# Patient Record
Sex: Female | Born: 1990 | Race: Asian | Hispanic: No | Marital: Married | Smoking: Never smoker
Health system: Southern US, Community
[De-identification: ages and names within clinical notes are randomized; demographics above are authoritative.]

## PROBLEM LIST (undated history)

## (undated) DIAGNOSIS — E039 Hypothyroidism, unspecified: Secondary | ICD-10-CM

## (undated) DIAGNOSIS — J45909 Unspecified asthma, uncomplicated: Secondary | ICD-10-CM

## (undated) DIAGNOSIS — J343 Hypertrophy of nasal turbinates: Secondary | ICD-10-CM

## (undated) DIAGNOSIS — J342 Deviated nasal septum: Secondary | ICD-10-CM

---

## 2012-07-03 ENCOUNTER — Ambulatory Visit (INDEPENDENT_AMBULATORY_CARE_PROVIDER_SITE_OTHER): Payer: No Typology Code available for payment source | Admitting: Family Medicine

## 2012-07-03 VITALS — BP 111/68 | HR 77 | Temp 98.0°F | Resp 16 | Ht 61.0 in | Wt 106.6 lb

## 2012-07-03 DIAGNOSIS — J45909 Unspecified asthma, uncomplicated: Secondary | ICD-10-CM | POA: Insufficient documentation

## 2012-07-03 DIAGNOSIS — R599 Enlarged lymph nodes, unspecified: Secondary | ICD-10-CM | POA: Insufficient documentation

## 2012-07-03 LAB — POCT CBC
Granulocyte percent: 54.3 %G (ref 37–80)
Hemoglobin: 14.9 g/dL (ref 12.2–16.2)
MCH, POC: 28.5 pg (ref 27–31.2)
MPV: 8.1 fL (ref 0–99.8)
POC MID %: 7.9 %M (ref 0–12)
Platelet Count, POC: 322 10*3/uL (ref 142–424)
RBC: 5.22 M/uL (ref 4.04–5.48)
WBC: 6.9 10*3/uL (ref 4.6–10.2)

## 2012-07-03 MED ORDER — ALBUTEROL SULFATE HFA 108 (90 BASE) MCG/ACT IN AERS: 2.0000 | INHALATION_SPRAY | Freq: Four times a day (QID) | RESPIRATORY_TRACT | Status: DC | PRN

## 2012-07-03 NOTE — Patient Instructions (Signed)
Thank you for coming in today. 1) asthma: Avoid chicken and shrimp for one month. Use the albuterol inhaler as needed for wheezing. Return in one month for evaluation. Call or go to the emergency room if you get worse, have trouble breathing, have chest pains, or palpitations.  2) lymph node: I think this lymph node needs to be removed and evaluated. We will send you to the Gen. surgery office to have this done.  If you have not heard from [place] by Friday please call the office and ask about your referral to general surgery.

## 2012-07-03 NOTE — Progress Notes (Addendum)
Anne Newton is a 22 y.o. female who presents to [facility] today for  1) left anterior cervical lymph node present for the last 2 months. The lymph node is large and nontender. It waxes and wanes. When asked she does have an approximately 15 pound unintentional weight loss. This is associated with starting a new job and maybe related to increased activity and decreased food intake. She denies any night sweats fevers or chills and feels well otherwise. She denies any other lumps or bumps across her body her history personal or family of malignancy.  2) asthma: Patient notes a childhood history of asthma. This is doing well until recently. Over the last several months she notes wheezing. This mostly occurs after eating chicken and shrimp. However she does note occasional wheezing with seasonal allergy symptoms. She denies any chest pain shortness of breath fevers or chills currently.   PMH: Reviewed asthma History  Substance Use Topics  . Smoking status: Never Smoker   . Smokeless tobacco: Not on file  . Alcohol Use: Not on file   ROS as above  Medications reviewed. No current outpatient prescriptions on file.   No current facility-administered medications for this visit.    Exam:  BP 111/68  Pulse 77  Temp(Src) 98 F (36.7 C) (Oral)  Resp 16  Ht 5\' 1"  (1.549 m)  Wt 106 lb 9.6 oz (48.353 kg)  BMI 20.15 kg/m2  SpO2 98%  PF 350 L/min  LMP 07/02/2012 Gen: Well NAD HEENT: EOMI,  MMM. Approximately 1.5 cm solid nontender anterior cervical lymph node on the left side. This is mobile Lungs: CTABL Nl WOB Heart: RRR no MRG Abd: NABS, NT, ND Exts: Non edematous BL  LE, warm and well perfused.  Peak flow around 300-350 with multiple attempts with coaching. Goal for height is 430  Results for orders placed in visit on 07/03/12 (from the past 72 hour(s))  POCT CBC     Status: None   Collection Time    07/03/12  7:45 PM      Result Value Range   WBC 6.9  4.6 - 10.2 K/uL   Lymph, poc 2.6  0.6 -  3.4   POC LYMPH PERCENT 37.8  10 - 50 %L   MID (cbc) 0.5  0 - 0.9   POC MID % 7.9  0 - 12 %M   POC Granulocyte 3.7  2 - 6.9   Granulocyte percent 54.3  37 - 80 %G   RBC 5.22  4.04 - 5.48 M/uL   Hemoglobin 14.9  12.2 - 16.2 g/dL   HCT, POC 21.3  08.6 - 47.9 %   MCV 89.9  80 - 97 fL   MCH, POC 28.5  27 - 31.2 pg   MCHC 31.8  31.8 - 35.4 g/dL   RDW, POC 57.8     Platelet Count, POC 322  142 - 424 K/uL   MPV 8.1  0 - 99.8 fL    Assessment and Plan: 22 y.o. female with   1) single anterior cervical enlarged lymph node. This is associated with an unintentional weight loss. I am concerned. Refer patient to general surgery for excisional biopsy.  2) asthma: Patient has some asthma symptoms and has a decreased peak flow in the office today.  Her history of wheezing with chicken and shrimp is somewhat confusing. She may have a food allergy and not asthma. Advised patient against consuming chicken and shrimp for one month. Additionally use albuterol as needed. We'll  followup in one month for further evaluation and management for this problem.  Will call referral to boy friend who speaks better english. [phone] Anne Newton)

## 2012-07-04 LAB — SEDIMENTATION RATE: Sed Rate: 6 mm/hr (ref 0–22)

## 2012-07-16 ENCOUNTER — Ambulatory Visit (INDEPENDENT_AMBULATORY_CARE_PROVIDER_SITE_OTHER): Payer: No Typology Code available for payment source | Admitting: Surgery

## 2012-07-19 ENCOUNTER — Other Ambulatory Visit: Payer: Self-pay | Admitting: Otolaryngology

## 2012-07-19 DIAGNOSIS — E041 Nontoxic single thyroid nodule: Secondary | ICD-10-CM

## 2012-07-31 ENCOUNTER — Ambulatory Visit
Admission: RE | Admit: 2012-07-31 | Discharge: 2012-07-31 | Disposition: A | Discharge status: Home / Self Care | Payer: No Typology Code available for payment source | Source: Ambulatory Visit | Attending: Otolaryngology | Admitting: Otolaryngology

## 2012-07-31 DIAGNOSIS — E041 Nontoxic single thyroid nodule: Secondary | ICD-10-CM

## 2012-07-31 MED ORDER — IOHEXOL 300 MG/ML  SOLN
75.0000 mL | Freq: Once | INTRAMUSCULAR | Status: AC | PRN
  Administered 2012-07-31: 75 mL via INTRAVENOUS

## 2012-08-05 ENCOUNTER — Other Ambulatory Visit: Payer: Self-pay | Admitting: Otolaryngology

## 2012-08-05 DIAGNOSIS — E041 Nontoxic single thyroid nodule: Secondary | ICD-10-CM

## 2012-08-06 ENCOUNTER — Other Ambulatory Visit: Payer: Self-pay | Admitting: Otolaryngology

## 2012-08-06 DIAGNOSIS — E041 Nontoxic single thyroid nodule: Secondary | ICD-10-CM

## 2012-08-07 ENCOUNTER — Ambulatory Visit
Admission: RE | Admit: 2012-08-07 | Discharge: 2012-08-07 | Disposition: A | Discharge status: Home / Self Care | Payer: No Typology Code available for payment source | Source: Ambulatory Visit | Attending: Otolaryngology | Admitting: Otolaryngology

## 2012-08-07 ENCOUNTER — Other Ambulatory Visit (HOSPITAL_COMMUNITY)
Admission: RE | Admit: 2012-08-07 | Discharge: 2012-08-07 | Disposition: A | Discharge status: Home / Self Care | Payer: No Typology Code available for payment source | Source: Ambulatory Visit | Attending: Interventional Radiology | Admitting: Interventional Radiology

## 2012-08-07 DIAGNOSIS — D34 Benign neoplasm of thyroid gland: Secondary | ICD-10-CM | POA: Insufficient documentation

## 2012-08-07 DIAGNOSIS — E041 Nontoxic single thyroid nodule: Secondary | ICD-10-CM

## 2012-08-21 ENCOUNTER — Encounter (HOSPITAL_COMMUNITY): Payer: Self-pay | Admitting: Pharmacy Technician

## 2012-08-26 ENCOUNTER — Encounter (HOSPITAL_COMMUNITY): Payer: Self-pay

## 2012-08-26 ENCOUNTER — Other Ambulatory Visit: Payer: Self-pay | Admitting: Otolaryngology

## 2012-08-26 ENCOUNTER — Encounter (HOSPITAL_COMMUNITY)
Admission: RE | Admit: 2012-08-26 | Discharge: 2012-08-26 | Disposition: A | Discharge status: Home / Self Care | Payer: No Typology Code available for payment source | Source: Ambulatory Visit | Attending: Otolaryngology | Admitting: Otolaryngology

## 2012-08-26 HISTORY — DX: Unspecified asthma, uncomplicated: J45.909

## 2012-08-26 LAB — CBC
HCT: 41.4 % (ref 36.0–46.0)
MCV: 84.7 fL (ref 78.0–100.0)
Platelets: 220 10*3/uL (ref 150–400)
RBC: 4.89 MIL/uL (ref 3.87–5.11)
WBC: 6.3 10*3/uL (ref 4.0–10.5)

## 2012-08-26 NOTE — H&P (Signed)
PREOPERATIVE H&P  Chief Complaint: left thyroid mass  HPI: Anne Newton is a 22 y.o. female who presents for evaluation of a left thyroid mass for 3 months. 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 She's taken to the OR for left thyroid lobectomy.  Past Medical History  Diagnosis Date  . Thyroid nodule   . Asthma   . Dysrhythmia     occ palpitations   No past surgical history on file. History   Social History  . Marital Status: Single    Spouse Name: N/A    Number of Children: N/A  . Years of Education: N/A   Social History Main Topics  . Smoking status: Never Smoker   . Smokeless tobacco: Not on file  . Alcohol Use: No  . Drug Use: No  . Sexually Active: Not on file   Other Topics Concern  . Not on file   Social History Narrative  . No narrative on file   No family history on file. Allergies  Allergen Reactions  . Chicken Allergy Shortness Of Breath    Causes  Asthma to flare up also shrimp   Prior to Admission medications   Medication Sig Start Date End Date Taking? Authorizing Provider  albuterol (PROVENTIL HFA;VENTOLIN HFA) 108 (90 BASE) MCG/ACT inhaler Inhale 2 puffs into the lungs every 6 (six) hours as needed for wheezing.   Yes Historical Provider, MD     Positive ROS: negative  All other systems have been reviewed and were otherwise negative with the exception of those mentioned in the HPI and as above.  Physical Exam: There were no vitals filed for this visit.  General: Alert, no acute distress Oral: Normal oral mucosa and tonsils Nasal: Clear nasal passages Neck: No palpable adenopathy. 2 cm left thyroid nodule Ear: Ear canal is clear with normal appearing TMs Cardiovascular: Regular rate and rhythm, no murmur.  Respiratory: Clear to auscultation Neurologic: Alert and oriented x 3   Assessment/Plan: THYROID NODULE  Plan for Procedure(s): LEFT THYROID LOBECTOMY   Dillard Cannon, MD 08/26/2012 3:29 PM

## 2012-08-26 NOTE — Progress Notes (Addendum)
No orders at preadmit  Office called No cxr needed unless other criteria, i.e. Problems with asthma or htn, cough ,etc  Per allison zelenak pa/dr hodierne

## 2012-08-26 NOTE — Pre-Procedure Instructions (Addendum)
Anne Newton  08/26/2012   Your procedure is scheduled on:  August 28, 2012 at 1:30 PM  Report to [facility] at 11:30 AM.  Call this number if you have problems the morning of surgery: [phone]   Remember:   Do not eat food or drink liquids after midnight.   Take these medicines the morning of surgery with A SIP OF WATER: Albuterol (Proventil/Ventolin) Inhaler if needed   Do not wear jewelry, make-up or nail polish.  Do not wear lotions, powders, or perfumes. You may wear deodorant.  Do not shave 48 hours prior to surgery. Men may shave face and neck.  Do not bring valuables to the hospital.  [facility] is not responsible                   for any belongings or valuables.  Contacts, dentures or bridgework may not be worn into surgery.  Leave suitcase in the car. After surgery it may be brought to your room.  For patients admitted to the hospital, checkout time is 11:00 AM the day of  discharge.     Special Instructions: Shower using CHG 2 nights before surgery and the night before surgery.  If you shower the day of surgery use CHG.  Use special wash - you have one bottle of CHG for all showers.  You should use approximately 1/3 of the bottle for each shower.   Please read over the following fact sheets that you were given: Pain Booklet, Coughing and Deep Breathing and Surgical Site Infection Prevention

## 2012-08-27 MED ORDER — CEFAZOLIN SODIUM-DEXTROSE 2-3 GM-% IV SOLR
2.0000 g | INTRAVENOUS | Status: AC
  Administered 2012-08-28: 2 g via INTRAVENOUS
  Filled 2012-08-27: qty 50

## 2012-08-28 ENCOUNTER — Observation Stay (HOSPITAL_COMMUNITY)
Admission: RE | Admit: 2012-08-28 | Discharge: 2012-08-29 | Disposition: A | Discharge status: Home / Self Care | Payer: No Typology Code available for payment source | Source: Ambulatory Visit | Attending: Otolaryngology | Admitting: Otolaryngology

## 2012-08-28 ENCOUNTER — Encounter (HOSPITAL_COMMUNITY): Payer: Self-pay | Admitting: *Deleted

## 2012-08-28 ENCOUNTER — Encounter (HOSPITAL_COMMUNITY)
Admission: RE | Disposition: A | Discharge status: Home / Self Care | Payer: Self-pay | Source: Ambulatory Visit | Attending: Otolaryngology

## 2012-08-28 ENCOUNTER — Ambulatory Visit (HOSPITAL_COMMUNITY): Payer: No Typology Code available for payment source | Admitting: Certified Registered"

## 2012-08-28 ENCOUNTER — Encounter (HOSPITAL_COMMUNITY): Payer: Self-pay | Admitting: Vascular Surgery

## 2012-08-28 DIAGNOSIS — C770 Secondary and unspecified malignant neoplasm of lymph nodes of head, face and neck: Secondary | ICD-10-CM | POA: Insufficient documentation

## 2012-08-28 DIAGNOSIS — C73 Malignant neoplasm of thyroid gland: Principal | ICD-10-CM | POA: Insufficient documentation

## 2012-08-28 DIAGNOSIS — Z01812 Encounter for preprocedural laboratory examination: Secondary | ICD-10-CM | POA: Insufficient documentation

## 2012-08-28 HISTORY — PX: THYROIDECTOMY: SHX17

## 2012-08-28 SURGERY — THYROIDECTOMY
Anesthesia: General | Site: Neck | Laterality: Left | Wound class: Clean

## 2012-08-28 MED ORDER — LIDOCAINE-EPINEPHRINE 1 %-1:100000 IJ SOLN
INTRAMUSCULAR | Status: DC | PRN
  Administered 2012-08-28: 20 mL

## 2012-08-28 MED ORDER — MEPERIDINE HCL 25 MG/ML IJ SOLN: 6.2500 mg | INTRAMUSCULAR | Status: DC | PRN

## 2012-08-28 MED ORDER — BACITRACIN ZINC 500 UNIT/GM EX OINT
TOPICAL_OINTMENT | CUTANEOUS | Status: AC
  Filled 2012-08-28: qty 15

## 2012-08-28 MED ORDER — ONDANSETRON HCL 4 MG/2ML IJ SOLN
INTRAMUSCULAR | Status: DC | PRN
  Administered 2012-08-28: 4 mg via INTRAVENOUS

## 2012-08-28 MED ORDER — ALBUTEROL SULFATE HFA 108 (90 BASE) MCG/ACT IN AERS: 2.0000 | INHALATION_SPRAY | Freq: Four times a day (QID) | RESPIRATORY_TRACT | Status: DC | PRN

## 2012-08-28 MED ORDER — MORPHINE SULFATE 2 MG/ML IJ SOLN
2.0000 mg | INTRAMUSCULAR | Status: DC | PRN
  Administered 2012-08-29: 2 mg via INTRAVENOUS
  Filled 2012-08-28: qty 1

## 2012-08-28 MED ORDER — ONDANSETRON HCL 4 MG/2ML IJ SOLN
4.0000 mg | INTRAMUSCULAR | Status: DC | PRN
  Administered 2012-08-28 – 2012-08-29 (×2): 4 mg via INTRAVENOUS
  Filled 2012-08-28 (×2): qty 2

## 2012-08-28 MED ORDER — CEFAZOLIN SODIUM 1-5 GM-% IV SOLN
1.0000 g | Freq: Three times a day (TID) | INTRAVENOUS | Status: DC
  Administered 2012-08-28 – 2012-08-29 (×3): 1 g via INTRAVENOUS
  Filled 2012-08-28 (×5): qty 50

## 2012-08-28 MED ORDER — KCL IN DEXTROSE-NACL 20-5-0.45 MEQ/L-%-% IV SOLN
INTRAVENOUS | Status: DC
  Administered 2012-08-29: 03:00:00 via INTRAVENOUS
  Filled 2012-08-28 (×4): qty 1000

## 2012-08-28 MED ORDER — ACETAMINOPHEN 160 MG/5ML PO SOLN: 650.0000 mg | ORAL | Status: DC | PRN

## 2012-08-28 MED ORDER — 0.9 % SODIUM CHLORIDE (POUR BTL) OPTIME
TOPICAL | Status: DC | PRN
  Administered 2012-08-28: 1000 mL

## 2012-08-28 MED ORDER — ARTIFICIAL TEARS OP OINT
TOPICAL_OINTMENT | OPHTHALMIC | Status: DC | PRN
  Administered 2012-08-28: 1 via OPHTHALMIC

## 2012-08-28 MED ORDER — ACETAMINOPHEN 650 MG RE SUPP: 650.0000 mg | RECTAL | Status: DC | PRN

## 2012-08-28 MED ORDER — OXYCODONE HCL 5 MG/5ML PO SOLN: 5.0000 mg | Freq: Once | ORAL | Status: DC | PRN

## 2012-08-28 MED ORDER — PHENYLEPHRINE HCL 10 MG/ML IJ SOLN
INTRAMUSCULAR | Status: DC | PRN
  Administered 2012-08-28 (×3): 80 ug via INTRAVENOUS
  Administered 2012-08-28 (×3): 40 ug via INTRAVENOUS
  Administered 2012-08-28: 120 ug via INTRAVENOUS
  Administered 2012-08-28: 80 ug via INTRAVENOUS

## 2012-08-28 MED ORDER — ROCURONIUM BROMIDE 100 MG/10ML IV SOLN
INTRAVENOUS | Status: DC | PRN
  Administered 2012-08-28: 5 mg via INTRAVENOUS
  Administered 2012-08-28: 30 mg via INTRAVENOUS

## 2012-08-28 MED ORDER — OXYCODONE HCL 5 MG PO TABS: 5.0000 mg | ORAL_TABLET | Freq: Once | ORAL | Status: DC | PRN

## 2012-08-28 MED ORDER — MIDAZOLAM HCL 2 MG/2ML IJ SOLN: 0.5000 mg | Freq: Once | INTRAMUSCULAR | Status: DC | PRN

## 2012-08-28 MED ORDER — LACTATED RINGERS IV SOLN
INTRAVENOUS | Status: DC
  Administered 2012-08-28 (×2): via INTRAVENOUS

## 2012-08-28 MED ORDER — PROMETHAZINE HCL 25 MG/ML IJ SOLN: 6.2500 mg | INTRAMUSCULAR | Status: DC | PRN

## 2012-08-28 MED ORDER — PROPOFOL 10 MG/ML IV BOLUS
INTRAVENOUS | Status: DC | PRN
  Administered 2012-08-28: 130 mg via INTRAVENOUS

## 2012-08-28 MED ORDER — HYDROMORPHONE HCL PF 1 MG/ML IJ SOLN
0.2500 mg | INTRAMUSCULAR | Status: DC | PRN
  Administered 2012-08-28: 0.5 mg via INTRAVENOUS

## 2012-08-28 MED ORDER — FENTANYL CITRATE 0.05 MG/ML IJ SOLN
INTRAMUSCULAR | Status: DC | PRN
  Administered 2012-08-28: 25 ug via INTRAVENOUS
  Administered 2012-08-28: 100 ug via INTRAVENOUS

## 2012-08-28 MED ORDER — ONDANSETRON HCL 4 MG PO TABS: 4.0000 mg | ORAL_TABLET | ORAL | Status: DC | PRN

## 2012-08-28 MED ORDER — PROMETHAZINE HCL 25 MG/ML IJ SOLN
INTRAMUSCULAR | Status: AC
  Administered 2012-08-28: 6.25 mg via INTRAVENOUS
  Filled 2012-08-28: qty 1

## 2012-08-28 MED ORDER — MIDAZOLAM HCL 5 MG/5ML IJ SOLN
INTRAMUSCULAR | Status: DC | PRN
  Administered 2012-08-28: 2 mg via INTRAVENOUS

## 2012-08-28 MED ORDER — GLYCOPYRROLATE 0.2 MG/ML IJ SOLN
INTRAMUSCULAR | Status: DC | PRN
  Administered 2012-08-28: 0.3 mg via INTRAVENOUS

## 2012-08-28 MED ORDER — NEOSTIGMINE METHYLSULFATE 1 MG/ML IJ SOLN
INTRAMUSCULAR | Status: DC | PRN
  Administered 2012-08-28: 2 mg via INTRAVENOUS

## 2012-08-28 MED ORDER — LIDOCAINE HCL 4 % MT SOLN
OROMUCOSAL | Status: DC | PRN
  Administered 2012-08-28: 4 mL via TOPICAL

## 2012-08-28 MED ORDER — DEXAMETHASONE SODIUM PHOSPHATE 4 MG/ML IJ SOLN
INTRAMUSCULAR | Status: DC | PRN
  Administered 2012-08-28: 4 mg via INTRAVENOUS

## 2012-08-28 MED ORDER — LIDOCAINE-EPINEPHRINE 1 %-1:100000 IJ SOLN
INTRAMUSCULAR | Status: AC
  Filled 2012-08-28: qty 1

## 2012-08-28 MED ORDER — HYDROMORPHONE HCL PF 1 MG/ML IJ SOLN
INTRAMUSCULAR | Status: AC
  Administered 2012-08-28: 0.5 mg via INTRAVENOUS
  Filled 2012-08-28: qty 1

## 2012-08-28 MED ORDER — HYDROCODONE-ACETAMINOPHEN 5-325 MG PO TABS
1.0000 | ORAL_TABLET | ORAL | Status: DC | PRN
  Administered 2012-08-29: 2 via ORAL
  Filled 2012-08-28: qty 2

## 2012-08-28 SURGICAL SUPPLY — 55 items
ATTRACTOMAT 16X20 MAGNETIC DRP (DRAPES) ×2 IMPLANT
BENZOIN TINCTURE PRP APPL 2/3 (GAUZE/BANDAGES/DRESSINGS) ×2 IMPLANT
BLADE SURG ROTATE 9660 (MISCELLANEOUS) IMPLANT
CANISTER SUCTION 2500CC (MISCELLANEOUS) ×2 IMPLANT
CLEANER TIP ELECTROSURG 2X2 (MISCELLANEOUS) ×2 IMPLANT
CLOTH BEACON ORANGE TIMEOUT ST (SAFETY) ×2 IMPLANT
CONT SPEC 4OZ CLIKSEAL STRL BL (MISCELLANEOUS) ×2 IMPLANT
CORDS BIPOLAR (ELECTRODE) ×2 IMPLANT
COVER SURGICAL LIGHT HANDLE (MISCELLANEOUS) ×2 IMPLANT
DECANTER SPIKE VIAL GLASS SM (MISCELLANEOUS) ×2 IMPLANT
DRAIN SNY 7 FPER (WOUND CARE) ×2 IMPLANT
ELECT COATED BLADE 2.86 ST (ELECTRODE) ×2 IMPLANT
ELECT REM PT RETURN 9FT ADLT (ELECTROSURGICAL) ×2
ELECTRODE REM PT RTRN 9FT ADLT (ELECTROSURGICAL) ×1 IMPLANT
EVACUATOR SILICONE 100CC (DRAIN) ×2 IMPLANT
GAUZE SPONGE 4X4 16PLY XRAY LF (GAUZE/BANDAGES/DRESSINGS) ×2 IMPLANT
GLOVE BIOGEL PI IND STRL 7.0 (GLOVE) ×1 IMPLANT
GLOVE BIOGEL PI IND STRL 8 (GLOVE) ×1 IMPLANT
GLOVE BIOGEL PI INDICATOR 7.0 (GLOVE) ×1
GLOVE BIOGEL PI INDICATOR 8 (GLOVE) ×1
GLOVE SKINSENSE NS SZ7.0 (GLOVE) ×2
GLOVE SKINSENSE NS SZ7.5 (GLOVE) ×2
GLOVE SKINSENSE STRL SZ7.0 (GLOVE) ×2 IMPLANT
GLOVE SKINSENSE STRL SZ7.5 (GLOVE) ×2 IMPLANT
GLOVE SS BIOGEL STRL SZ 7.5 (GLOVE) ×1 IMPLANT
GLOVE SUPERSENSE BIOGEL SZ 7.5 (GLOVE) ×1
GOWN PREVENTION PLUS XLARGE (GOWN DISPOSABLE) ×6 IMPLANT
GOWN STRL NON-REIN LRG LVL3 (GOWN DISPOSABLE) ×2 IMPLANT
KIT BASIN OR (CUSTOM PROCEDURE TRAY) ×2 IMPLANT
KIT ROOM TURNOVER OR (KITS) ×2 IMPLANT
LOCATOR NERVE 3 VOLT (DISPOSABLE) IMPLANT
NS IRRIG 1000ML POUR BTL (IV SOLUTION) ×2 IMPLANT
PAD ARMBOARD 7.5X6 YLW CONV (MISCELLANEOUS) ×4 IMPLANT
PENCIL BUTTON HOLSTER BLD 10FT (ELECTRODE) ×2 IMPLANT
SPECIMEN JAR MEDIUM (MISCELLANEOUS) ×2 IMPLANT
SPONGE GAUZE 4X4 12PLY (GAUZE/BANDAGES/DRESSINGS) ×2 IMPLANT
SPONGE INTESTINAL PEANUT (DISPOSABLE) ×2 IMPLANT
STAPLER VISISTAT 35W (STAPLE) ×2 IMPLANT
STRIP CLOSURE SKIN 1/2X4 (GAUZE/BANDAGES/DRESSINGS) ×2 IMPLANT
SUT CHROMIC 3 0 PS 2 (SUTURE) ×2 IMPLANT
SUT ETHILON 2 0 FS 18 (SUTURE) ×2 IMPLANT
SUT ETHILON 5 0 P 3 18 (SUTURE) ×1
SUT NYLON ETHILON 5-0 P-3 1X18 (SUTURE) ×1 IMPLANT
SUT SILK 2 0 (SUTURE) ×1
SUT SILK 2 0 SH CR/8 (SUTURE) IMPLANT
SUT SILK 2-0 18XBRD TIE 12 (SUTURE) ×1 IMPLANT
SUT SILK 3 0 (SUTURE) ×1
SUT SILK 3-0 18XBRD TIE 12 (SUTURE) ×1 IMPLANT
SUT SILK 4 0 (SUTURE) ×1
SUT SILK 4-0 18XBRD TIE 12 (SUTURE) ×1 IMPLANT
TAPE CLOTH SURG 4X10 WHT LF (GAUZE/BANDAGES/DRESSINGS) ×2 IMPLANT
TOWEL OR 17X24 6PK STRL BLUE (TOWEL DISPOSABLE) ×2 IMPLANT
TOWEL OR 17X26 10 PK STRL BLUE (TOWEL DISPOSABLE) ×2 IMPLANT
TRAY ENT MC OR (CUSTOM PROCEDURE TRAY) ×2 IMPLANT
WATER STERILE IRR 1000ML POUR (IV SOLUTION) ×2 IMPLANT

## 2012-08-28 NOTE — Anesthesia Preprocedure Evaluation (Addendum)
Anesthesia Evaluation  Patient identified by MRN, date of birth, ID band Patient awake    Reviewed: Allergy & Precautions, H&P , NPO status , Patient's Chart, lab work & pertinent test results  History of Anesthesia Complications Negative for: history of anesthetic complications  Airway Mallampati: II TM Distance: >3 FB Neck ROM: Full    Dental  (+) Chipped, Teeth Intact and Dental Advisory Given,    Pulmonary asthma (rare inhaler use) ,  breath sounds clear to auscultation  Pulmonary exam normal       Cardiovascular negative cardio ROS  Rhythm:Regular Rate:Normal     Neuro/Psych negative neurological ROS     GI/Hepatic negative GI ROS, Neg liver ROS,   Endo/Other  negative endocrine ROS  Renal/GU negative Renal ROS     Musculoskeletal   Abdominal   Peds  Hematology negative hematology ROS (+)   Anesthesia Other Findings   Reproductive/Obstetrics 08/26/12 preg test: neg                        Anesthesia Physical Anesthesia Plan  ASA: II  Anesthesia Plan: General   Post-op Pain Management:    Induction: Intravenous  Airway Management Planned: Oral ETT  Additional Equipment:   Intra-op Plan:   Post-operative Plan: Extubation in OR  Informed Consent: I have reviewed the patients History and Physical, chart, labs and discussed the procedure including the risks, benefits and alternatives for the proposed anesthesia with the patient or authorized representative who has indicated his/her understanding and acceptance.   Dental advisory given  Plan Discussed with: Surgeon and CRNA  Anesthesia Plan Comments: (Plan routine monitors, GETA)        Anesthesia Quick Evaluation

## 2012-08-28 NOTE — Brief Op Note (Signed)
08/28/2012  3:06 PM  PATIENT:  Anne Newton  21 y.o. female  PRE-OPERATIVE DIAGNOSIS:  THYROID NODULE   POST-OPERATIVE DIAGNOSIS:  thyroid nodule  PROCEDURE:  Procedure(s): LEFT THYROID LOBECTOMY (Left)  SURGEON:  Surgeon(s) and Role:    * Drema Halon, MD - Primary    * Keturah Barre, MD - Assisting  PHYSICIAN ASSISTANT:   ASSISTANTS: Hermelinda Medicus   ANESTHESIA:   general  EBL:  Total I/O In: 1000 [I.V.:1000] Out: -   BLOOD ADMINISTERED:none  DRAINS: none   LOCAL MEDICATIONS USED:  LIDOCAINE   SPECIMEN:  Source of Specimen:  left thyroid lobe  DISPOSITION OF SPECIMEN:  PATHOLOGY  COUNTS:  YES  TOURNIQUET:  * No tourniquets in log *  DICTATION: .Other Dictation: Dictation Number [id number]  PLAN OF CARE: Admit for overnight observation  PATIENT DISPOSITION:  PACU - hemodynamically stable.   Delay start of Pharmacological VTE agent (>24hrs) due to surgical blood loss or risk of bleeding: yes

## 2012-08-28 NOTE — Anesthesia Postprocedure Evaluation (Signed)
  Anesthesia Post-op Note  [name]  Procedure(s) Performed: Procedure(s): LEFT THYROID LOBECTOMY (Left)  Patient Location: PACU  Anesthesia Type:General  Level of Consciousness: awake, alert  and oriented  Airway and Oxygen Therapy: Patient Spontanous Breathing and Patient connected to nasal cannula oxygen  Post-op Pain: mild  Post-op Assessment: Post-op Vital signs reviewed  Post-op Vital Signs: Reviewed  Complications: No apparent anesthesia complications

## 2012-08-28 NOTE — Anesthesia Procedure Notes (Signed)
Procedure Name: Intubation Date/Time: 08/28/2012 1:38 PM Performed by: Jefm Miles E Pre-anesthesia Checklist: Patient identified, Emergency Drugs available, Suction available, Patient being monitored and Timeout performed Patient Re-evaluated:Patient Re-evaluated prior to inductionOxygen Delivery Method: Circle system utilized Preoxygenation: Pre-oxygenation with 100% oxygen Intubation Type: IV induction Ventilation: Mask ventilation without difficulty Laryngoscope Size: Miller and 2 Grade View: Grade I Tube type: Oral Tube size: 7.0 mm Number of attempts: 1 Airway Equipment and Method: Stylet Placement Confirmation: ETT inserted through vocal cords under direct vision,  breath sounds checked- equal and bilateral and positive ETCO2 Secured at: 22 cm Tube secured with: Tape Dental Injury: Teeth and Oropharynx as per pre-operative assessment

## 2012-08-28 NOTE — Transfer of Care (Signed)
Immediate Anesthesia Transfer of Care Note  [name]  Procedure(s) Performed: Procedure(s): LEFT THYROID LOBECTOMY (Left)  Patient Location: PACU  Anesthesia Type:General  Level of Consciousness: lethargic and responds to stimulation  Airway & Oxygen Therapy: Patient Spontanous Breathing and Patient connected to nasal cannula oxygen  Post-op Assessment: Report given to PACU RN  Post vital signs: Reviewed and stable  Complications: No apparent anesthesia complications

## 2012-08-28 NOTE — Preoperative (Signed)
Beta Blockers   Reason not to administer Beta Blockers:Not Applicable 

## 2012-08-28 NOTE — Interval H&P Note (Signed)
History and Physical Interval Note:  08/28/2012 9:06 AM  Anne Newton  has presented today for surgery, with the diagnosis of THYROID NODULE   The various methods of treatment have been discussed with the patient and family. After consideration of risks, benefits and other options for treatment, the patient has consented to  Procedure(s): LEFT THYROID LOBECTOMY (Left) as a surgical intervention .  The patient's history has been reviewed, patient examined, no change in status, stable for surgery.  I have reviewed the patient's chart and labs.  Questions were answered to the patient's satisfaction.     Tajai Ihde

## 2012-08-29 ENCOUNTER — Encounter (HOSPITAL_COMMUNITY): Payer: Self-pay | Admitting: Otolaryngology

## 2012-08-29 MED ORDER — HYDROCODONE-ACETAMINOPHEN 5-300 MG PO TABS: 1.0000 | ORAL_TABLET | Freq: Four times a day (QID) | ORAL | Status: DC | PRN

## 2012-08-29 NOTE — Progress Notes (Signed)
POD 1 AF VSS Wound doing well without swelling Discharge Home Follow up at my office in 4 days. Dc dictated [id number]

## 2012-08-29 NOTE — Discharge Summary (Signed)
NAMEKALLEN, DELATORRE NO.:  [id number]  MEDICAL RECORD NO.:  [id number]  LOCATION:  [id number]                        FACILITY:  [facility]  PHYSICIAN:  Kristine Garbe. 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  OPERATION AND PROCEDURES:  During this hospitalization is a left thyroid lobectomy on August 29, 2012.  HOSPITAL COURSE:  The patient was admitted via the operating room on August 28, 2012, at which time, she underwent a left thyroid lobectomy for a thyroid nodule that was needle aspirate positive for follicular neoplasm.  Postoperatively, she received perioperative Ancef.  She was admitted for overnight observation and was discharged home on first postoperative day.  She is afebrile.  Vital signs were stable.  Wound was healing well, with no significant swelling.  The patient was discharged home.  We will have her follow up in my office in 4 days for recheck to have subcuticular sutures removed and review final pathology.  DISCHARGE MEDICATIONS:  Include her home medications which includes an inhaler and also Tylenol, Motrin, or Vicodin p.r.n. pain.  FINAL DIAGNOSIS:  Left lobe thyroid neoplasm.          ______________________________ Kristine Garbe Ezzard Standing, M.D.     CEN/MEDQ  D:  08/29/2012  T:  08/29/2012  Job:  [id number]

## 2012-08-29 NOTE — Op Note (Deleted)
NAME:  [name], [name]                    ACCOUNT NO.:  [id number]  MEDICAL RECORD NO.:  [id number]  LOCATION:  SDS                          FACILITY:  [facility]  PHYSICIAN:  Devine Klingel E. Hanz Winterhalter, M.D.DATE OF BIRTH:  06/08/1990  DATE OF PROCEDURE:  08/28/2012 DATE OF DISCHARGE:  08/29/2012                              OPERATIVE REPORT   PREOPERATIVE DIAGNOSIS:  Left thyroid nodule.  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  POSTOPERATIVE DIAGNOSIS:  Left thyroid nodule.  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  PROCEDURE PERFORMED:  Left thyroid lobectomy.  SURGEON:  Jack Mineau E. Dali Kraner, M.D.  ASSISTANT:  James Crossley, M.D.  ANESTHESIA:  General endotracheal.  COMPLICATIONS:  None.  BRIEF CLINICAL NOTE:  Anne Newton is a 21-year-old female, who has noticed a nodule on the left side of her neck now for a little over a month. The ultrasound and fine needle aspirate demonstrated and a 1.8-cm left thyroid nodule with fine-needle aspirate consistent with a follicular neoplasm.  She has no other palpable adenopathy in the neck and is taken to the operating room at this time for left thyroid lobectomy.  DESCRIPTION OF PROCEDURE:  After adequate endotracheal anesthesia, the patient was positioned on the table with a roll underneath her shoulders to extend her neck.  She has easily palpable about 2-cm nodule in the left thyroid lobe.  A standard horizontal incision was made at the inferior aspect of the thyroid gland.  Subplatysmal flaps were elevated superiorly and inferiorly.  Hemostasis was obtained with the cautery. The strap muscles were then divided in midline, retracted laterally over the left thyroid lobe.  The nodule was in the midportion of the left thyroid gland.  The parathyroid glands inferiorly were identified and preserved.  They were dissected off the thyroid gland and hemostats were used to clamp and ligate the inferior vessels with  3-0 silk ligatures. The superior thyroid vessels were then again identified and ligated with 3-0 silk ligatures and divided.  The thyroid gland was dissected off the trachea with dissection really close to the thyroid gland.  The recurrent nerve was questionably identified and dissection was carried out just adjacent to the gland, and the tracheoesophageal groove was really not dissected out.  Berry's ligament was divided with bipolar cautery.  The gland and nodule were dissected off the trachea and the tracheoesophageal groove and the left lobe was removed and sent to Pathology for routine pathology.  Hemostasis was obtained with bipolar cautery.  The thyroid bed was then irrigated with saline.  It was dried with no obvious bleeding.  It was elected not to use a drain and the wound was closed with 3-0 chromic sutures reapproximate the strap muscles, 3-0 chromic sutures subcutaneously, and then subcuticular stitch with 5-0 nylon, and Steri-Strips were applied.  Anne Newton was then awoken from anesthesia and transferred to the recovery room, postop doing well. We will plan on observation in the hospital overnight and discharge home in the morning.  She will receive perioperative Ancef.  We will have the patient followup in my office in 1 week for recheck and review final pathology.            ______________________________ Rori Goar E. Daris Aristizabal, M.D.     CEN/MEDQ  D:  08/28/2012  T:  08/28/2012  Job:  [id number] 

## 2012-08-29 NOTE — Op Note (Signed)
NAMECAMIE, [name] NO.:  [id number]  MEDICAL RECORD NO.:  [id number]  LOCATION:  SDS                          FACILITY:  [facility]  PHYSICIAN:  Kristine Garbe. Ezzard Standing, M.D.DATE OF BIRTH:  April 28, 1990  DATE OF PROCEDURE:  08/28/2012 DATE OF DISCHARGE:  08/29/2012                              OPERATIVE REPORT   PREOPERATIVE DIAGNOSIS:  Left thyroid nodule.  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  POSTOPERATIVE DIAGNOSIS:  Left thyroid nodule.  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  PROCEDURE PERFORMED:  Left thyroid lobectomy.  SURGEON:  Kristine Garbe. Ezzard Standing, M.D.  ASSISTANT:  Hermelinda Medicus, M.D.  ANESTHESIA:  General endotracheal.  COMPLICATIONS:  None.  BRIEF CLINICAL NOTE:  Anne Newton is a 22 year old female, who has noticed a nodule on the left side of her neck now for a little over a month. The ultrasound and fine needle aspirate demonstrated and a 1.8-cm left thyroid nodule with fine-needle aspirate consistent with a follicular neoplasm.  She has no other palpable adenopathy in the neck and is taken to the operating room at this time for left thyroid lobectomy.  DESCRIPTION OF PROCEDURE:  After adequate endotracheal anesthesia, the patient was positioned on the table with a roll underneath her shoulders to extend her neck.  She has easily palpable about 2-cm nodule in the left thyroid lobe.  A standard horizontal incision was made at the inferior aspect of the thyroid gland.  Subplatysmal flaps were elevated superiorly and inferiorly.  Hemostasis was obtained with the cautery. The strap muscles were then divided in midline, retracted laterally over the left thyroid lobe.  The nodule was in the midportion of the left thyroid gland.  The parathyroid glands inferiorly were identified and preserved.  They were dissected off the thyroid gland and hemostats were used to clamp and ligate the inferior vessels with  3-0 silk ligatures. The superior thyroid vessels were then again identified and ligated with 3-0 silk ligatures and divided.  The thyroid gland was dissected off the trachea with dissection really close to the thyroid gland.  The recurrent nerve was questionably identified and dissection was carried out just adjacent to the gland, and the tracheoesophageal groove was really not dissected out.  Berry's ligament was divided with bipolar cautery.  The gland and nodule were dissected off the trachea and the tracheoesophageal groove and the left lobe was removed and sent to Pathology for routine pathology.  Hemostasis was obtained with bipolar cautery.  The thyroid bed was then irrigated with saline.  It was dried with no obvious bleeding.  It was elected not to use a drain and the wound was closed with 3-0 chromic sutures reapproximate the strap muscles, 3-0 chromic sutures subcutaneously, and then subcuticular stitch with 5-0 nylon, and Steri-Strips were applied.  Anne Newton was then awoken from anesthesia and transferred to the recovery room, postop doing well. We will plan on observation in the hospital overnight and discharge home in the morning.  She will receive perioperative Ancef.  We will have the patient followup in my office in 1 week for recheck and review final pathology.  ______________________________ Kristine Garbe Ezzard Standing, M.D.     CEN/MEDQ  D:  08/28/2012  T:  08/28/2012  Job:  [id number]

## 2012-08-29 NOTE — Progress Notes (Signed)
Pt and significant other given discharge instructions.  They verbalized understanding of all instructions and follow-up information.  No pain at this time.  Pt still only wanting clear and full liquids, but tolerating them well.  No concerns at this time.  Pt taken down for discharge via wheelchair.

## 2012-08-29 NOTE — Progress Notes (Signed)
Pt still having some episodes of nausea.  They are requesting nausea med for discharge.  Notified Dr Ezzard Standing and he stated that he would call some PO Zofran into [facility] on [address].  Will cont to monitor

## 2012-10-11 ENCOUNTER — Encounter (HOSPITAL_COMMUNITY): Payer: Self-pay | Admitting: Pharmacist

## 2012-10-16 ENCOUNTER — Encounter (HOSPITAL_COMMUNITY)
Admission: RE | Admit: 2012-10-16 | Discharge: 2012-10-16 | Disposition: A | Discharge status: Home / Self Care | Payer: No Typology Code available for payment source | Source: Ambulatory Visit | Attending: Anesthesiology | Admitting: Anesthesiology

## 2012-10-16 ENCOUNTER — Other Ambulatory Visit: Payer: Self-pay | Admitting: Otolaryngology

## 2012-10-16 ENCOUNTER — Encounter (HOSPITAL_COMMUNITY): Payer: Self-pay

## 2012-10-16 ENCOUNTER — Encounter (HOSPITAL_COMMUNITY)
Admission: RE | Admit: 2012-10-16 | Discharge: 2012-10-16 | Disposition: A | Discharge status: Home / Self Care | Payer: No Typology Code available for payment source | Source: Ambulatory Visit | Attending: Otolaryngology | Admitting: Otolaryngology

## 2012-10-16 DIAGNOSIS — Z01818 Encounter for other preprocedural examination: Secondary | ICD-10-CM | POA: Insufficient documentation

## 2012-10-16 DIAGNOSIS — Z01812 Encounter for preprocedural laboratory examination: Secondary | ICD-10-CM | POA: Insufficient documentation

## 2012-10-16 LAB — BASIC METABOLIC PANEL
BUN: 11 mg/dL (ref 6–23)
CO2: 28 mEq/L (ref 19–32)
Chloride: 102 mEq/L (ref 96–112)
GFR calc Af Amer: >90 mL/min (ref >90)
Potassium: 3.9 mEq/L (ref 3.5–5.1)

## 2012-10-16 LAB — CBC
HCT: 43.1 % (ref 36.0–46.0)
Hemoglobin: 14.2 g/dL (ref 12.0–15.0)
MCHC: 32.9 g/dL (ref 30.0–36.0)
MCV: 85.2 fL (ref 78.0–100.0)
WBC: 5.7 10*3/uL (ref 4.0–10.5)

## 2012-10-16 NOTE — Pre-Procedure Instructions (Signed)
KATEY BARRIE  10/16/2012   Your procedure is scheduled on:  Monday, October 21, 2012  Report to [facility] Main Entrance "A"  at 8:00 AM.  Call this number if you have problems the morning of surgery: [phone]   Remember:   Do not eat food or drink liquids after midnight.   Take these medicines the morning of surgery with A SIP OF WATER: none  If needed:albuterol (PROVENTIL HFA;VENTOLIN HFA) 108 (90 BASE) MCG/ACT inhaler for wheezing (bring in on day of  Surgery).  Stop taking Aspirin, multivitamins, and herbal medications. Do not take any NSAIDs ie: Ibuprofen, Advil, Naproxen or any  medication containing Aspirin.   Do not wear jewelry, make-up or nail polish.  Do not wear lotions, powders, or perfumes. You may wear deodorant.  Do not shave 48 hours prior to surgery.  Do not bring valuables to the hospital.  [facility] is not responsible for any belongings or valuables.               Contacts, dentures or bridgework may not be worn into surgery.  Leave suitcase in the car. After surgery it may be brought to your room.  For patients admitted to the hospital, discharge time is determined by your treatment team.               Patients discharged the day of surgery will not be allowed to drive home.  Name and phone number of your driver:   Special Instructions: Shower using CHG 2 nights before surgery and the night before surgery.  If you shower the day of surgery use CHG.  Use special wash - you have one bottle of CHG for all showers.  You should use approximately 1/3 of the bottle for each shower.   Please read over the following fact sheets that you were given: Pain Booklet, Coughing and Deep Breathing and Surgical Site Infection Prevention

## 2012-10-16 NOTE — Progress Notes (Signed)
Pt denies SOB, chest pain, and being under the care of a cardiologist. Pt states that she has not had a chest x ray or EKG in the last year. Pt denies having a stress test, echo, and cardiac cath.

## 2012-10-18 NOTE — H&P (Signed)
PREOPERATIVE H&P  Chief Complaint: thyroid cancer  HPI: Anne Newton is a 22 y.o. female who presents for evaluation of thyroid cancer. 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 The size measured  approx 2 cm with lymphatic invasion. She's taken to the OR for completion thyroidectomy.  Past Medical History  Diagnosis Date  . Thyroid nodule   . Asthma   . Dysrhythmia     occ palpitations  . Complication of anesthesia   . PONV (postoperative nausea and vomiting)   . Cancer     Hx: of thyroid cancer   Past Surgical History  Procedure Laterality Date  . Thyroid lobectomy Left 08/28/2012    Dr Ezzard Standing  . Thyroidectomy Left 08/28/2012    Procedure: LEFT THYROID LOBECTOMY;  Surgeon: Drema Halon, MD;  Location: [facility] OR;  Service: ENT;  Laterality: Left;   History   Social History  . Marital Status: Single    Spouse Name: N/A    Number of Children: N/A  . Years of Education: N/A   Social History Main Topics  . Smoking status: Never Smoker   . Smokeless tobacco: Never Used  . Alcohol Use: No  . Drug Use: No  . Sexual Activity: Not on file   Other Topics Concern  . Not on file   Social History Narrative  . No narrative on file   No family history on file. Allergies  Allergen Reactions  . Chicken Allergy Shortness Of Breath    Causes  Asthma to flare up also shrimp   Prior to Admission medications   Medication Sig Start Date End Date Taking? Authorizing Provider  albuterol (PROVENTIL HFA;VENTOLIN HFA) 108 (90 BASE) MCG/ACT inhaler Inhale 2 puffs into the lungs every 6 (six) hours as needed for wheezing.    Historical Provider, MD     Positive ROS: negative  All other systems have been reviewed and were otherwise negative with the exception of those mentioned in the HPI and as above.  Physical Exam: There were no vitals filed for this visit.  General: Alert, no acute  distress Oral: Normal oral mucosa and tonsils Nasal: Clear nasal passages Neck: No palpable adenopathy. Well healed scar. Ear: Ear canal is clear with normal appearing TMs Cardiovascular: Regular rate and rhythm, no murmur.  Respiratory: Clear to auscultation Neurologic: Alert and oriented x 3   Assessment/Plan: THYROID CANCER Plan for Procedure(s): COMPLETION THYROIDECTOMY (RIGHT THYROID LOBECTOMY)   Dillard Cannon, MD 10/18/2012 4:35 PM

## 2012-10-20 MED ORDER — CEFAZOLIN SODIUM-DEXTROSE 2-3 GM-% IV SOLR
2.0000 g | INTRAVENOUS | Status: AC
  Administered 2012-10-21: 2 g via INTRAVENOUS
  Filled 2012-10-20 (×2): qty 50

## 2012-10-21 ENCOUNTER — Encounter (HOSPITAL_COMMUNITY): Payer: Self-pay | Admitting: *Deleted

## 2012-10-21 ENCOUNTER — Ambulatory Visit (HOSPITAL_COMMUNITY)
Admission: RE | Admit: 2012-10-21 | Discharge: 2012-10-22 | Disposition: A | Discharge status: Home / Self Care | Payer: No Typology Code available for payment source | Source: Ambulatory Visit | Attending: Otolaryngology | Admitting: Otolaryngology

## 2012-10-21 ENCOUNTER — Encounter (HOSPITAL_COMMUNITY): Payer: Self-pay | Admitting: Vascular Surgery

## 2012-10-21 ENCOUNTER — Ambulatory Visit (HOSPITAL_COMMUNITY): Payer: No Typology Code available for payment source | Admitting: Certified Registered"

## 2012-10-21 ENCOUNTER — Encounter (HOSPITAL_COMMUNITY)
Admission: RE | Disposition: A | Discharge status: Home / Self Care | Payer: Self-pay | Source: Ambulatory Visit | Attending: Otolaryngology

## 2012-10-21 DIAGNOSIS — C770 Secondary and unspecified malignant neoplasm of lymph nodes of head, face and neck: Secondary | ICD-10-CM | POA: Insufficient documentation

## 2012-10-21 DIAGNOSIS — C73 Malignant neoplasm of thyroid gland: Secondary | ICD-10-CM | POA: Insufficient documentation

## 2012-10-21 HISTORY — PX: THYROIDECTOMY: SHX17

## 2012-10-21 SURGERY — THYROIDECTOMY
Anesthesia: General | Site: Neck | Laterality: Right | Wound class: Clean

## 2012-10-21 MED ORDER — ONDANSETRON HCL 4 MG/2ML IJ SOLN
INTRAMUSCULAR | Status: DC | PRN
  Administered 2012-10-21: 4 mg via INTRAVENOUS

## 2012-10-21 MED ORDER — LACTATED RINGERS IV SOLN
INTRAVENOUS | Status: DC
  Administered 2012-10-21 (×2): via INTRAVENOUS

## 2012-10-21 MED ORDER — EPHEDRINE SULFATE 50 MG/ML IJ SOLN
INTRAMUSCULAR | Status: DC | PRN
  Administered 2012-10-21: 15 mg via INTRAVENOUS
  Administered 2012-10-21: 10 mg via INTRAVENOUS

## 2012-10-21 MED ORDER — HYDROCODONE-ACETAMINOPHEN 5-325 MG PO TABS
1.0000 | ORAL_TABLET | ORAL | Status: DC | PRN
  Administered 2012-10-21: 1.5 via ORAL

## 2012-10-21 MED ORDER — LIDOCAINE-EPINEPHRINE 1 %-1:100000 IJ SOLN
INTRAMUSCULAR | Status: DC | PRN
  Administered 2012-10-21: 20 mL

## 2012-10-21 MED ORDER — ACETAMINOPHEN 160 MG/5ML PO SOLN: 650.0000 mg | ORAL | Status: DC | PRN

## 2012-10-21 MED ORDER — MIDAZOLAM HCL 5 MG/5ML IJ SOLN
INTRAMUSCULAR | Status: DC | PRN
  Administered 2012-10-21: 2 mg via INTRAVENOUS

## 2012-10-21 MED ORDER — OXYCODONE HCL 5 MG PO TABS: 5.0000 mg | ORAL_TABLET | Freq: Once | ORAL | Status: DC | PRN

## 2012-10-21 MED ORDER — LIDOCAINE-EPINEPHRINE 1 %-1:100000 IJ SOLN
INTRAMUSCULAR | Status: AC
  Filled 2012-10-21: qty 1

## 2012-10-21 MED ORDER — ONDANSETRON HCL 4 MG/2ML IJ SOLN
INTRAMUSCULAR | Status: AC
  Filled 2012-10-21: qty 2

## 2012-10-21 MED ORDER — HYDROMORPHONE HCL PF 1 MG/ML IJ SOLN
0.2500 mg | INTRAMUSCULAR | Status: DC | PRN
  Administered 2012-10-21: 1 mg via INTRAVENOUS

## 2012-10-21 MED ORDER — ARTIFICIAL TEARS OP OINT
TOPICAL_OINTMENT | OPHTHALMIC | Status: DC | PRN
  Administered 2012-10-21: 1 via OPHTHALMIC

## 2012-10-21 MED ORDER — ZOLPIDEM TARTRATE 5 MG PO TABS: 5.0000 mg | ORAL_TABLET | Freq: Every evening | ORAL | Status: DC | PRN

## 2012-10-21 MED ORDER — HYDROMORPHONE HCL PF 1 MG/ML IJ SOLN
INTRAMUSCULAR | Status: AC
  Filled 2012-10-21: qty 1

## 2012-10-21 MED ORDER — ROCURONIUM BROMIDE 100 MG/10ML IV SOLN
INTRAVENOUS | Status: DC | PRN
  Administered 2012-10-21: 40 mg via INTRAVENOUS

## 2012-10-21 MED ORDER — ONDANSETRON HCL 4 MG/2ML IJ SOLN
4.0000 mg | INTRAMUSCULAR | Status: DC | PRN
  Administered 2012-10-21 (×2): 4 mg via INTRAVENOUS
  Filled 2012-10-21 (×2): qty 2

## 2012-10-21 MED ORDER — BACITRACIN ZINC 500 UNIT/GM EX OINT
1.0000 "application " | TOPICAL_OINTMENT | Freq: Three times a day (TID) | CUTANEOUS | Status: DC
  Filled 2012-10-21: qty 28.35

## 2012-10-21 MED ORDER — GLYCOPYRROLATE 0.2 MG/ML IJ SOLN
INTRAMUSCULAR | Status: DC | PRN
  Administered 2012-10-21: 0.3 mg via INTRAVENOUS

## 2012-10-21 MED ORDER — HYDROCODONE-ACETAMINOPHEN 7.5-325 MG/15ML PO SOLN
ORAL | Status: AC
  Filled 2012-10-21: qty 15

## 2012-10-21 MED ORDER — PROMETHAZINE HCL 25 MG PO TABS
12.5000 mg | ORAL_TABLET | Freq: Four times a day (QID) | ORAL | Status: DC | PRN
  Administered 2012-10-21: 12.5 mg via ORAL
  Filled 2012-10-21: qty 1

## 2012-10-21 MED ORDER — DEXAMETHASONE SODIUM PHOSPHATE 4 MG/ML IJ SOLN
INTRAMUSCULAR | Status: DC | PRN
  Administered 2012-10-21: 4 mg via INTRAVENOUS

## 2012-10-21 MED ORDER — LIDOCAINE HCL (CARDIAC) 20 MG/ML IV SOLN
INTRAVENOUS | Status: DC | PRN
  Administered 2012-10-21: 80 mg via INTRAVENOUS

## 2012-10-21 MED ORDER — FENTANYL CITRATE 0.05 MG/ML IJ SOLN
INTRAMUSCULAR | Status: DC | PRN
  Administered 2012-10-21: 25 ug via INTRAVENOUS
  Administered 2012-10-21: 100 ug via INTRAVENOUS

## 2012-10-21 MED ORDER — NEOSTIGMINE METHYLSULFATE 1 MG/ML IJ SOLN
INTRAMUSCULAR | Status: DC | PRN
  Administered 2012-10-21: 2.5 mg via INTRAVENOUS

## 2012-10-21 MED ORDER — ALBUTEROL SULFATE HFA 108 (90 BASE) MCG/ACT IN AERS
2.0000 | INHALATION_SPRAY | Freq: Four times a day (QID) | RESPIRATORY_TRACT | Status: DC | PRN
  Filled 2012-10-21: qty 6.7

## 2012-10-21 MED ORDER — MORPHINE SULFATE 2 MG/ML IJ SOLN
2.0000 mg | INTRAMUSCULAR | Status: DC | PRN
  Administered 2012-10-21 (×2): 2 mg via INTRAVENOUS
  Filled 2012-10-21 (×2): qty 1

## 2012-10-21 MED ORDER — KCL IN DEXTROSE-NACL 20-5-0.45 MEQ/L-%-% IV SOLN
INTRAVENOUS | Status: DC
  Administered 2012-10-21 – 2012-10-22 (×2): via INTRAVENOUS
  Filled 2012-10-21 (×3): qty 1000

## 2012-10-21 MED ORDER — ACETAMINOPHEN 650 MG RE SUPP: 650.0000 mg | RECTAL | Status: DC | PRN

## 2012-10-21 MED ORDER — OXYCODONE HCL 5 MG/5ML PO SOLN: 5.0000 mg | Freq: Once | ORAL | Status: DC | PRN

## 2012-10-21 MED ORDER — BACITRACIN ZINC 500 UNIT/GM EX OINT
TOPICAL_OINTMENT | CUTANEOUS | Status: AC
  Filled 2012-10-21: qty 15

## 2012-10-21 MED ORDER — ONDANSETRON HCL 4 MG PO TABS: 4.0000 mg | ORAL_TABLET | ORAL | Status: DC | PRN

## 2012-10-21 MED ORDER — ONDANSETRON HCL 4 MG/2ML IJ SOLN
4.0000 mg | Freq: Four times a day (QID) | INTRAMUSCULAR | Status: AC | PRN
  Administered 2012-10-21: 4 mg via INTRAVENOUS

## 2012-10-21 MED ORDER — CEFAZOLIN SODIUM 1-5 GM-% IV SOLN
1.0000 g | Freq: Three times a day (TID) | INTRAVENOUS | Status: DC
  Administered 2012-10-21 – 2012-10-22 (×3): 1 g via INTRAVENOUS
  Filled 2012-10-21 (×6): qty 50

## 2012-10-21 MED ORDER — SCOPOLAMINE 1 MG/3DAYS TD PT72
1.0000 | MEDICATED_PATCH | TRANSDERMAL | Status: DC
  Administered 2012-10-21: 1.5 mg via TRANSDERMAL
  Filled 2012-10-21: qty 1

## 2012-10-21 MED ORDER — BACITRACIN ZINC 500 UNIT/GM EX OINT
TOPICAL_OINTMENT | CUTANEOUS | Status: DC | PRN
  Administered 2012-10-21: 1 via TOPICAL

## 2012-10-21 MED ORDER — PROPOFOL 10 MG/ML IV BOLUS
INTRAVENOUS | Status: DC | PRN
  Administered 2012-10-21: 160 mg via INTRAVENOUS

## 2012-10-21 SURGICAL SUPPLY — 51 items
BENZOIN TINCTURE PRP APPL 2/3 (GAUZE/BANDAGES/DRESSINGS) ×2 IMPLANT
BLADE SURG ROTATE 9660 (MISCELLANEOUS) IMPLANT
CANISTER SUCTION 2500CC (MISCELLANEOUS) ×2 IMPLANT
CLEANER TIP ELECTROSURG 2X2 (MISCELLANEOUS) ×2 IMPLANT
CLOTH BEACON ORANGE TIMEOUT ST (SAFETY) ×2 IMPLANT
CONT SPEC 4OZ CLIKSEAL STRL BL (MISCELLANEOUS) ×2 IMPLANT
CORDS BIPOLAR (ELECTRODE) ×2 IMPLANT
COVER SURGICAL LIGHT HANDLE (MISCELLANEOUS) ×2 IMPLANT
DECANTER SPIKE VIAL GLASS SM (MISCELLANEOUS) ×2 IMPLANT
DRAIN SNY 7 FPER (WOUND CARE) ×2 IMPLANT
DRSG TEGADERM 2-3/8X2-3/4 SM (GAUZE/BANDAGES/DRESSINGS) ×2 IMPLANT
ELECT COATED BLADE 2.86 ST (ELECTRODE) ×2 IMPLANT
ELECT REM PT RETURN 9FT ADLT (ELECTROSURGICAL) ×2
ELECTRODE REM PT RTRN 9FT ADLT (ELECTROSURGICAL) ×1 IMPLANT
EVACUATOR SILICONE 100CC (DRAIN) ×2 IMPLANT
GAUZE SPONGE 4X4 16PLY XRAY LF (GAUZE/BANDAGES/DRESSINGS) ×2 IMPLANT
GLOVE BIO SURGEON STRL SZ7.5 (GLOVE) ×2 IMPLANT
GLOVE BIOGEL PI IND STRL 7.5 (GLOVE) ×1 IMPLANT
GLOVE BIOGEL PI INDICATOR 7.5 (GLOVE) ×1
GLOVE SS BIOGEL STRL SZ 7.5 (GLOVE) ×1 IMPLANT
GLOVE SUPERSENSE BIOGEL SZ 7.5 (GLOVE) ×1
GLOVE SURG SS PI 6.5 STRL IVOR (GLOVE) ×2 IMPLANT
GLOVE SURG SS PI 7.5 STRL IVOR (GLOVE) ×4 IMPLANT
GOWN PREVENTION PLUS XLARGE (GOWN DISPOSABLE) ×4 IMPLANT
GOWN STRL NON-REIN LRG LVL3 (GOWN DISPOSABLE) ×4 IMPLANT
HEMOSTAT SNOW SURGICEL 2X4 (HEMOSTASIS) ×2 IMPLANT
KIT BASIN OR (CUSTOM PROCEDURE TRAY) ×2 IMPLANT
KIT ROOM TURNOVER OR (KITS) ×2 IMPLANT
LOCATOR NERVE 3 VOLT (DISPOSABLE) IMPLANT
NS IRRIG 1000ML POUR BTL (IV SOLUTION) ×2 IMPLANT
PAD ARMBOARD 7.5X6 YLW CONV (MISCELLANEOUS) ×4 IMPLANT
PENCIL BUTTON HOLSTER BLD 10FT (ELECTRODE) ×2 IMPLANT
SPECIMEN JAR MEDIUM (MISCELLANEOUS) ×2 IMPLANT
SPONGE GAUZE 4X4 12PLY (GAUZE/BANDAGES/DRESSINGS) ×2 IMPLANT
STAPLER VISISTAT 35W (STAPLE) ×2 IMPLANT
STRIP CLOSURE SKIN 1/2X4 (GAUZE/BANDAGES/DRESSINGS) ×2 IMPLANT
SUT CHROMIC 3 0 PS 2 (SUTURE) ×2 IMPLANT
SUT ETHILON 2 0 FS 18 (SUTURE) ×2 IMPLANT
SUT ETHILON 5 0 P 3 18 (SUTURE) ×1
SUT NYLON ETHILON 5-0 P-3 1X18 (SUTURE) ×1 IMPLANT
SUT SILK 2 0 (SUTURE) ×1
SUT SILK 2 0 SH CR/8 (SUTURE) IMPLANT
SUT SILK 2-0 18XBRD TIE 12 (SUTURE) ×1 IMPLANT
SUT SILK 3 0 (SUTURE) ×1
SUT SILK 3-0 18XBRD TIE 12 (SUTURE) ×1 IMPLANT
SUT SILK 4 0 (SUTURE) ×1
SUT SILK 4-0 18XBRD TIE 12 (SUTURE) ×1 IMPLANT
TOWEL OR 17X24 6PK STRL BLUE (TOWEL DISPOSABLE) ×2 IMPLANT
TOWEL OR 17X26 10 PK STRL BLUE (TOWEL DISPOSABLE) ×2 IMPLANT
TRAY ENT MC OR (CUSTOM PROCEDURE TRAY) ×2 IMPLANT
WATER STERILE IRR 1000ML POUR (IV SOLUTION) ×2 IMPLANT

## 2012-10-21 NOTE — Anesthesia Procedure Notes (Signed)
Procedure Name: Intubation Date/Time: 10/21/2012 10:36 AM Performed by: Jefm Miles E Pre-anesthesia Checklist: Patient identified, Timeout performed, Emergency Drugs available, Suction available and Patient being monitored Patient Re-evaluated:Patient Re-evaluated prior to inductionOxygen Delivery Method: Circle system utilized Preoxygenation: Pre-oxygenation with 100% oxygen Intubation Type: IV induction Ventilation: Mask ventilation without difficulty Laryngoscope Size: Miller and 2 Grade View: Grade I Tube type: Oral Tube size: 7.0 mm Number of attempts: 1 Airway Equipment and Method: Stylet Placement Confirmation: ETT inserted through vocal cords under direct vision,  positive ETCO2 and breath sounds checked- equal and bilateral Secured at: 21 cm Tube secured with: Tape Dental Injury: Teeth and Oropharynx as per pre-operative assessment

## 2012-10-21 NOTE — Interval H&P Note (Signed)
History and Physical Interval Note:  10/21/2012 10:08 AM  Anne Newton  has presented today for surgery, with the diagnosis of THYROID CANCER  The various methods of treatment have been discussed with the patient and family. After consideration of risks, benefits and other options for treatment, the patient has consented to  Procedure(s): COMPLETION THYROIDECTOMY (RIGHT THYROID LOBECTOMY) (Right) as a surgical intervention .  The patient's history has been reviewed, patient examined, no change in status, stable for surgery.  I have reviewed the patient's chart and labs.  Questions were answered to the patient's satisfaction.     NEWMAN, CHRISTOPHER

## 2012-10-21 NOTE — Anesthesia Preprocedure Evaluation (Addendum)
Anesthesia Evaluation  Patient identified by MRN, date of birth, ID band Patient awake    Reviewed: Allergy & Precautions, H&P , NPO status , Patient's Chart, lab work & pertinent test results  History of Anesthesia Complications (+) PONV  Airway Mallampati: II  Neck ROM: full    Dental  (+) Teeth Intact, Dental Advisory Given and Chipped,    Pulmonary asthma ,          Cardiovascular Rhythm:Regular Rate:Normal     Neuro/Psych    GI/Hepatic   Endo/Other  Thyroid CA  Renal/GU      Musculoskeletal   Abdominal   Peds  Hematology   Anesthesia Other Findings   Reproductive/Obstetrics                          Anesthesia Physical Anesthesia Plan  ASA: II  Anesthesia Plan: General   Post-op Pain Management:    Induction: Intravenous  Airway Management Planned: Oral ETT  Additional Equipment:   Intra-op Plan:   Post-operative Plan: Extubation in OR  Informed Consent: I have reviewed the patients History and Physical, chart, labs and discussed the procedure including the risks, benefits and alternatives for the proposed anesthesia with the patient or authorized representative who has indicated his/her understanding and acceptance.     Plan Discussed with: CRNA, Anesthesiologist and Surgeon  Anesthesia Plan Comments:         Anesthesia Quick Evaluation

## 2012-10-21 NOTE — Preoperative (Signed)
Beta Blockers   Reason not to administer Beta Blockers:Not Applicable 

## 2012-10-21 NOTE — Transfer of Care (Signed)
Immediate Anesthesia Transfer of Care Note  [name]  Procedure(s) Performed: Procedure(s): COMPLETION THYROIDECTOMY (RIGHT THYROID LOBECTOMY) (Right)  Patient Location: PACU  Anesthesia Type:General  Level of Consciousness: awake, alert  and oriented  Airway & Oxygen Therapy: Patient Spontanous Breathing and Patient connected to nasal cannula oxygen  Post-op Assessment: Report given to PACU RN  Post vital signs: Reviewed and stable  Complications: No apparent anesthesia complications

## 2012-10-21 NOTE — Progress Notes (Signed)
Post Op check Complains of nausea. Min pain . No airway problems. Wound without swelling. Stable post op course. Check Ca in am . Probable discharge tomorrow am.

## 2012-10-21 NOTE — Brief Op Note (Signed)
10/21/2012  12:21 PM  PATIENT:  Anne Newton  21 y.o. female  PRE-OPERATIVE DIAGNOSIS:  THYROID CANCER  POST-OPERATIVE DIAGNOSIS:  THYROID CANCER  PROCEDURE:  Procedure(s): COMPLETION THYROIDECTOMY (RIGHT THYROID LOBECTOMY) (Right)  SURGEON:  Surgeon(s) and Role:    * Drema Halon, MD - Primary    * Keturah Barre, MD - Assisting  PHYSICIAN ASSISTANT:   ASSISTANTS: none   ANESTHESIA:   general  EBL:  Total I/O In: 1000 [I.V.:1000] Out: -   BLOOD ADMINISTERED:none  DRAINS: none   LOCAL MEDICATIONS USED:  LIDOCAINE with EPI  SPECIMEN:  Source of Specimen:  right thyroid lobe  DISPOSITION OF SPECIMEN:  PATHOLOGY  COUNTS:  YES  TOURNIQUET:  * No tourniquets in log *  DICTATION: .Other Dictation: Dictation Number [id number]  PLAN OF CARE: Admit for overnight observation  PATIENT DISPOSITION:  PACU - hemodynamically stable.   Delay start of Pharmacological VTE agent (>24hrs) due to surgical blood loss or risk of bleeding: yes

## 2012-10-22 ENCOUNTER — Encounter (HOSPITAL_COMMUNITY): Payer: Self-pay | Admitting: Otolaryngology

## 2012-10-22 MED ORDER — HYDROCODONE-ACETAMINOPHEN 5-300 MG PO TABS: 1.0000 | ORAL_TABLET | Freq: Four times a day (QID) | ORAL | Status: DC | PRN

## 2012-10-22 MED ORDER — PROMETHAZINE HCL 12.5 MG PO TABS: 12.5000 mg | ORAL_TABLET | Freq: Four times a day (QID) | ORAL | Status: DC | PRN

## 2012-10-22 NOTE — Op Note (Signed)
NAMEKAZI, MONTORO NO.:  [id number]  MEDICAL RECORD NO.:  [id number]  LOCATION:  6N25C                        FACILITY:  [facility]  PHYSICIAN:  Kristine Garbe. Ezzard Standing, M.D.DATE OF BIRTH:  Feb 09, 1990  DATE OF PROCEDURE:  10/21/2012 DATE OF DISCHARGE:                              OPERATIVE REPORT   PREOPERATIVE DIAGNOSIS:  The patient with a papillary carcinoma status post left thyroid lobectomy.  POSTOPERATIVE DIAGNOSIS:  The patient with a papillary carcinoma status post left thyroid lobectomy.  OPERATION PERFORMED:  The completion total thyroidectomy with right thyroid lobectomy.  SURGEON:  Kristine Garbe. Ezzard Standing, MD  ASSISTANT SURGEON:  Hermelinda Medicus, MD  ANESTHESIA:  General endotracheal.  COMPLICATIONS:  None.  BRIEF CLINICAL NOTE:  Anne Newton is a 22 year old female who had a left thyroid nodule for several months and underwent a left thyroid lobectomy about 8 weeks ago.  Final pathology report on the left thyroid lobectomy showed approximately 2 cm left papillary thyroid carcinoma with lymphatic invasion and metastatic to a local lymph node.  She was taken to the operating room at this time for completion thyroidectomy for right thyroid lobectomy.  DESCRIPTION OF PROCEDURE:  After adequate endotracheal anesthesia, the patient received 2-g Ancef IV preoperatively.  The patient was intubated without any difficulty.  Her neck was prepped with Betadine solution. Neck was then draped.  The previous incision was used.  Dissection was carried down through the subcutaneous tissue, scar tissue down to the strap muscles.  Flaps were elevated superiorly and inferiorly from the strap muscles.  Strap muscles were then divided in midline and retracted laterally.  The remaining thyroid isthmus, scar tissue, and right thyroid lobectomy were then dissected off the strap muscles.  The thyroid was dissected off the trachea.  Then, dissection carried out inferiorly  where the inferior thyroid vessels were clamped, ligated with 3-0 silk sutures, and divided.  The superior thyroid vessels were then clamped, ligated, with 3-0 silk sutures, and a 2-0 silk suture.  Some of the lateral thyroid veins were likewise dissected off and ligated with 3- 0 silk sutures.  The parathyroids were identified superiorly and inferiorly and preserved.  It was felt that the nerve might have been visualized, but the nerve was not dissected out.  Bipolar cautery was used for hemostasis around the nerve for few veins.  The lobe was dissected out of the thyroid esophageal groove, staying close to the thyroid gland.  Specimen was sent to Pathology as a right thyroid lobe in patient with papillary carcinoma of the left thyroid lobe. Hemostasis was obtained with bipolar cautery.  One small area of oozing around the nerve was packed with Surgicel flakes.  There was no substantial bleeding noted as the thyroid bed was dry.  Strap muscles were reapproximated with 3-0 chromic suture.  The incision was closed with 3-0 chromic sutures subcutaneously and 5-0 subcuticular stitch followed by Steri-Strips.  Dressing was applied.  The patient was awoken from anesthesia and transferred to the recovery room, and postop doing well.  DISPOSITION:  The patient will be observed overnight for observation and plan discharge in the morning.  I will have her follow  up in my office in 4 days to have the subcuticular stitch removed, in 1 week to have the Steri-Strips removed.          ______________________________ Kristine Garbe Ezzard Standing, M.D.     CEN/MEDQ  D:  10/21/2012  T:  10/22/2012  Job:  [id number]  cc:   Margaretmary Bayley, M.D. Hermelinda Medicus, M.D.

## 2012-10-29 NOTE — Discharge Summary (Signed)
NAMECALIEGH, MIDDLEKAUFF NO.:  [id number]  MEDICAL RECORD NO.:  [id number]  LOCATION:  6N25C                        FACILITY:  [facility]  PHYSICIAN:  Kristine Garbe. Ezzard Standing, M.D.DATE OF BIRTH:  30-May-1990  DATE OF ADMISSION:  10/21/2012 DATE OF DISCHARGE:  10/22/2012                              DISCHARGE SUMMARY   DIAGNOSIS:  Chronic papillary thyroid carcinoma.  OPERATIONS:  During this hospitalization would be right thyroid lobectomy with completion total thyroidectomy on October 16, 2012.  HOSPITAL COURSE:  The patient was admitted via the operating room on October 21, 2012, at which time she underwent a completion total thyroidectomy following a previous left thyroid lobectomy.  This showed papillary carcinoma with metastasis to a local lymph node.  She tolerated the surgery well.  Postoperatively, was monitored for 24 hours.  Her postoperative calcium check was normal.  The next morning, she had minimal swelling and minimal discomfort and was subsequently discharged home on her first postoperative day on October 22, 2012.  DISPOSITION:  She was discharged home and will follow up in my office in 1 week for recheck.  No new medications except for Vicodin p.r.n. pain.          ______________________________ Kristine Garbe. Ezzard Standing, M.D.     CEN/MEDQ  D:  10/28/2012  T:  10/29/2012  Job:  [id number]

## 2012-11-04 ENCOUNTER — Other Ambulatory Visit: Payer: Self-pay | Admitting: Internal Medicine

## 2012-11-04 DIAGNOSIS — C73 Malignant neoplasm of thyroid gland: Secondary | ICD-10-CM

## 2012-11-11 NOTE — Anesthesia Postprocedure Evaluation (Signed)
Anesthesia Post Note  [name]  Procedure(s) Performed: Procedure(s) (LRB): COMPLETION THYROIDECTOMY (RIGHT THYROID LOBECTOMY) (Right)  Anesthesia type: General  Patient location: PACU  Post pain: Pain level controlled and Adequate analgesia  Post assessment: Post-op Vital signs reviewed, Patient's Cardiovascular Status Stable, Respiratory Function Stable, Patent Airway and Pain level controlled  Last Vitals:  Filed Vitals:   10/22/12 0543  BP: 80/46  Pulse: 72  Temp: 37 C  Resp:     Post vital signs: Reviewed and stable  Level of consciousness: awake, alert  and oriented  Complications: No apparent anesthesia complications

## 2012-11-18 ENCOUNTER — Encounter (HOSPITAL_COMMUNITY)
Admission: RE | Admit: 2012-11-18 | Discharge: 2012-11-18 | Disposition: A | Discharge status: Home / Self Care | Payer: No Typology Code available for payment source | Source: Ambulatory Visit | Attending: Internal Medicine | Admitting: Internal Medicine

## 2012-11-19 ENCOUNTER — Encounter (HOSPITAL_COMMUNITY): Payer: No Typology Code available for payment source

## 2012-11-20 ENCOUNTER — Ambulatory Visit (HOSPITAL_COMMUNITY)
Admission: RE | Admit: 2012-11-20 | Discharge: 2012-11-20 | Disposition: A | Discharge status: Home / Self Care | Payer: No Typology Code available for payment source | Source: Ambulatory Visit | Attending: Internal Medicine | Admitting: Internal Medicine

## 2012-11-20 ENCOUNTER — Encounter (HOSPITAL_COMMUNITY)
Admission: RE | Admit: 2012-11-20 | Discharge: 2012-11-20 | Disposition: A | Discharge status: Home / Self Care | Payer: No Typology Code available for payment source | Source: Ambulatory Visit | Attending: Internal Medicine | Admitting: Internal Medicine

## 2012-11-20 ENCOUNTER — Encounter (HOSPITAL_COMMUNITY): Payer: No Typology Code available for payment source

## 2012-11-20 DIAGNOSIS — C73 Malignant neoplasm of thyroid gland: Secondary | ICD-10-CM | POA: Insufficient documentation

## 2012-11-20 DIAGNOSIS — Z3202 Encounter for pregnancy test, result negative: Secondary | ICD-10-CM | POA: Insufficient documentation

## 2012-11-20 LAB — HCG, SERUM, QUALITATIVE: Preg, Serum: NEGATIVE

## 2012-11-20 MED ORDER — SODIUM IODIDE I 131 CAPSULE
130.2000 | Freq: Once | INTRAVENOUS | Status: AC | PRN
  Administered 2012-11-20: 130.2 via ORAL

## 2012-11-29 ENCOUNTER — Encounter (HOSPITAL_COMMUNITY)
Admission: RE | Admit: 2012-11-29 | Discharge: 2012-11-29 | Disposition: A | Discharge status: Home / Self Care | Payer: No Typology Code available for payment source | Source: Ambulatory Visit | Attending: Internal Medicine | Admitting: Internal Medicine

## 2012-11-29 DIAGNOSIS — C73 Malignant neoplasm of thyroid gland: Secondary | ICD-10-CM | POA: Insufficient documentation

## 2013-05-13 ENCOUNTER — Other Ambulatory Visit: Payer: Self-pay | Admitting: Internal Medicine

## 2013-05-13 DIAGNOSIS — C73 Malignant neoplasm of thyroid gland: Secondary | ICD-10-CM

## 2013-05-19 ENCOUNTER — Encounter (HOSPITAL_COMMUNITY): Admission: RE | Admit: 2013-05-19 | Payer: 59 | Source: Ambulatory Visit

## 2013-05-20 ENCOUNTER — Encounter (HOSPITAL_COMMUNITY): Payer: 59

## 2013-05-21 ENCOUNTER — Encounter (HOSPITAL_COMMUNITY): Payer: 59

## 2013-05-23 ENCOUNTER — Encounter (HOSPITAL_COMMUNITY): Payer: 59

## 2013-05-26 ENCOUNTER — Encounter (HOSPITAL_COMMUNITY): Admission: RE | Admit: 2013-05-26 | Payer: 59 | Source: Ambulatory Visit

## 2013-05-27 ENCOUNTER — Encounter (HOSPITAL_COMMUNITY): Payer: 59

## 2013-05-28 ENCOUNTER — Encounter (HOSPITAL_COMMUNITY): Payer: 59

## 2013-05-30 ENCOUNTER — Encounter (HOSPITAL_COMMUNITY): Payer: 59

## 2013-06-02 ENCOUNTER — Inpatient Hospital Stay (HOSPITAL_COMMUNITY): Admission: RE | Admit: 2013-06-02 | Payer: No Typology Code available for payment source | Source: Ambulatory Visit

## 2013-06-03 ENCOUNTER — Ambulatory Visit (HOSPITAL_COMMUNITY): Payer: No Typology Code available for payment source

## 2013-06-04 ENCOUNTER — Ambulatory Visit (HOSPITAL_COMMUNITY): Payer: No Typology Code available for payment source

## 2013-06-06 ENCOUNTER — Encounter (HOSPITAL_COMMUNITY): Payer: No Typology Code available for payment source

## 2013-06-23 ENCOUNTER — Encounter (HOSPITAL_COMMUNITY)
Admission: RE | Admit: 2013-06-23 | Discharge: 2013-06-23 | Disposition: A | Discharge status: Home / Self Care | Payer: 59 | Source: Ambulatory Visit | Attending: Internal Medicine | Admitting: Internal Medicine

## 2013-06-23 DIAGNOSIS — C73 Malignant neoplasm of thyroid gland: Secondary | ICD-10-CM | POA: Insufficient documentation

## 2013-06-23 MED ORDER — THYROTROPIN ALFA 1.1 MG IM SOLR
0.9000 mg | INTRAMUSCULAR | Status: AC
  Administered 2013-06-23: 0.9 mg via INTRAMUSCULAR

## 2013-06-24 ENCOUNTER — Encounter (HOSPITAL_COMMUNITY)
Admission: RE | Admit: 2013-06-24 | Discharge: 2013-06-24 | Disposition: A | Discharge status: Home / Self Care | Payer: 59 | Source: Ambulatory Visit | Attending: Internal Medicine | Admitting: Internal Medicine

## 2013-06-24 MED ORDER — THYROTROPIN ALFA 1.1 MG IM SOLR
0.9000 mg | INTRAMUSCULAR | Status: AC
  Administered 2013-06-24: 0.9 mg via INTRAMUSCULAR

## 2013-06-25 ENCOUNTER — Encounter (HOSPITAL_COMMUNITY)
Admission: RE | Admit: 2013-06-25 | Discharge: 2013-06-25 | Disposition: A | Discharge status: Home / Self Care | Payer: 59 | Source: Ambulatory Visit | Attending: Internal Medicine | Admitting: Internal Medicine

## 2013-06-25 LAB — HCG, SERUM, QUALITATIVE: PREG SERUM: NEGATIVE

## 2013-06-27 ENCOUNTER — Encounter (HOSPITAL_COMMUNITY)
Admission: RE | Admit: 2013-06-27 | Discharge: 2013-06-27 | Disposition: A | Discharge status: Home / Self Care | Payer: 59 | Source: Ambulatory Visit | Attending: Internal Medicine | Admitting: Internal Medicine

## 2013-06-30 LAB — THYROGLOBULIN LEVEL: Thyroglobulin: 2.7 ng/mL (ref 0.0–55.0)

## 2013-06-30 LAB — THYROGLOBULIN ANTIBODY

## 2013-10-06 ENCOUNTER — Ambulatory Visit (INDEPENDENT_AMBULATORY_CARE_PROVIDER_SITE_OTHER): Payer: 59 | Admitting: Emergency Medicine

## 2013-10-06 VITALS — BP 98/68 | HR 74 | Temp 97.9°F | Resp 18 | Ht 61.0 in | Wt 116.8 lb

## 2013-10-06 DIAGNOSIS — C73 Malignant neoplasm of thyroid gland: Secondary | ICD-10-CM

## 2013-10-06 NOTE — Progress Notes (Signed)
[facility] [address] [place] [address] [phone]  Date:  10/06/2013   Name:  Anne Newton   DOB:  06/29/1990   MRN:  [id number]  PCP:  Foye Spurling, MD    Chief Complaint: Referral   History of Present Illness:  Anne Newton is a 23 y.o. very pleasant female patient who presents with the following:  Patient had surgery for papilllary thyroid cancer.  Now needs referral back to surgeon due to insurance change Denies other complaint or health concern today.   Patient Active Problem List   Diagnosis Date Noted  . Unspecified asthma(493.90) 07/03/2012  . Enlargement of lymph nodes 07/03/2012    Past Medical History  Diagnosis Date  . Thyroid nodule   . Asthma   . Dysrhythmia     occ palpitations  . Complication of anesthesia   . PONV (postoperative nausea and vomiting)   . Cancer     Hx: of thyroid cancer    Past Surgical History  Procedure Laterality Date  . Thyroid lobectomy Left 08/28/2012    Dr Lucia Gaskins  . Thyroidectomy Left 08/28/2012    Procedure: LEFT THYROID LOBECTOMY;  Surgeon: Rozetta Nunnery, MD;  Location: [place];  Service: ENT;  Laterality: Left;  . Thyroidectomy Right 10/21/2012    Procedure: COMPLETION THYROIDECTOMY (RIGHT THYROID LOBECTOMY);  Surgeon: Rozetta Nunnery, MD;  Location: [place];  Service: ENT;  Laterality: Right;    History  Substance Use Topics  . Smoking status: Never Smoker   . Smokeless tobacco: Never Used  . Alcohol Use: No    History reviewed. No pertinent family history.  Allergies  Allergen Reactions  . Chicken Allergy Shortness Of Breath    Causes  Asthma to flare up also shrimp    Medication list has been reviewed and updated.  Current Outpatient Prescriptions on File Prior to Visit  Medication Sig Dispense Refill  . albuterol (PROVENTIL HFA;VENTOLIN HFA) 108 (90 BASE) MCG/ACT inhaler Inhale 2 puffs into the lungs every 6 (six) hours as needed for wheezing.      Marland Kitchen  Hydrocodone-Acetaminophen 5-300 MG TABS Take 1-2 tablets by mouth every 6 (six) hours as needed (pain).  112 each  0  . promethazine (PHENERGAN) 12.5 MG tablet Take 1 tablet (12.5 mg total) by mouth every 6 (six) hours as needed for nausea.  8 tablet  0   No current facility-administered medications on file prior to visit.    Review of Systems:  .revi   Physical Examination: Filed Vitals:   10/06/13 1506  BP: 98/68  Pulse: 74  Temp: 97.9 F (36.6 C)  Resp: 18   Filed Vitals:   10/06/13 1506  Height: 5\' 1"  (1.549 m)  Weight: 116 lb 12.8 oz (52.98 kg)   Body mass index is 22.08 kg/(m^2). Ideal Body Weight: Weight in (lb) to have BMI = 25: 132   GEN: WDWN, NAD, Non-toxic, Alert & Oriented x 3 HEENT: Atraumatic, Normocephalic.  Ears and Nose: No external deformity. EXTR: No clubbing/cyanosis/edema NEURO: Normal gait.  PSYCH: Normally interactive. Conversant. Not depressed or anxious appearing.  Calm demeanor.    Assessment and Plan: S/p thyroidectomy   Signed,  Ellison Carwin, MD

## 2013-12-16 ENCOUNTER — Ambulatory Visit (INDEPENDENT_AMBULATORY_CARE_PROVIDER_SITE_OTHER): Payer: 59 | Admitting: Physician Assistant

## 2013-12-16 VITALS — BP 118/74 | HR 98 | Temp 98.3°F | Resp 18 | Ht 61.0 in | Wt 111.6 lb

## 2013-12-16 DIAGNOSIS — E890 Postprocedural hypothyroidism: Secondary | ICD-10-CM

## 2013-12-16 DIAGNOSIS — C73 Malignant neoplasm of thyroid gland: Secondary | ICD-10-CM

## 2013-12-16 DIAGNOSIS — E039 Hypothyroidism, unspecified: Secondary | ICD-10-CM | POA: Insufficient documentation

## 2013-12-16 NOTE — Progress Notes (Signed)
   Subjective:    Patient ID: Anne Newton, female    DOB: 02/03/1990, 23 y.o.   MRN: [id number]  HPI  This is a 23 year old female with PMH thyroid cancer s/p thyroidectomy 10/14 and hypothyroidism who is here asking for a referral to Dr. Carlis Abbott, her endocrinologist. She was here 9/15 needing a referral to her surgeon. She thinks something is wrong with her insurance. She saw her surgeon in September 2015 who suggested 6 month f/u. Her endocrinologist manages her hypothyroidism. She takes 100 mg synthroid every day. She is not having any hot or cold intolerance, constipation, dry skin or loss of hair.  Review of Systems  Constitutional: Negative for fever and chills.  Gastrointestinal: Negative for constipation.  Endocrine: Negative.   Skin: Negative.       Objective:   Physical Exam  Constitutional: She is oriented to person, place, and time. She appears well-developed and well-nourished. No distress.  HENT:  Head: Normocephalic and atraumatic.  Right Ear: Hearing normal.  Left Ear: Hearing normal.  Nose: Nose normal.  Eyes: Conjunctivae and lids are normal. Right eye exhibits no discharge. Left eye exhibits no discharge. No scleral icterus.  Neck: No thyroid mass present.  4 cm surgical scar across anterior neck  Cardiovascular: Normal rate, regular rhythm, normal heart sounds, intact distal pulses and normal pulses.   No murmur heard. Pulmonary/Chest: Effort normal and breath sounds normal. No respiratory distress. She has no wheezes. She has no rhonchi. She has no rales.  Musculoskeletal: Normal range of motion.  Neurological: She is alert and oriented to person, place, and time.  Skin: Skin is warm, dry and intact. No lesion and no rash noted.  Psychiatric: She has a normal mood and affect. Her speech is normal and behavior is normal. Thought content normal.      Assessment & Plan:  1. Thyroid cancer 2. Postoperative hypothyroidism Unsure what is wrong with patient's insurance -  encouraged pt to call her insurance company. Made referral to Dr. Carlis Abbott.   - Ambulatory referral to Endocrinology   Benjaman Pott. Drenda Freeze, MHS [facility]  12/16/2013

## 2013-12-16 NOTE — Patient Instructions (Signed)
You will be called to make your appt with your endocrinologist. Talk to Dr. Carlis Abbott and see how long he needs to follow you - when he feels comfortable, we are happy to follow your hypothyroidism. Call your insurance company to figure out what's going on.

## 2013-12-16 NOTE — Progress Notes (Signed)
The patient was discussed with me and I agree with the diagnosis and treatment plan.  

## 2013-12-30 ENCOUNTER — Telehealth: Payer: Self-pay

## 2013-12-30 NOTE — Telephone Encounter (Signed)
Patient is requesting to speak with Bennett Scrape PA. Patient stated it was regarding her medications but wouldn't give any other information. I asked her if she needed a refill and she said no.  Patients call back number is [phone]

## 2014-01-01 ENCOUNTER — Telehealth: Payer: Self-pay

## 2014-01-01 NOTE — Telephone Encounter (Signed)
Called back and left message with pt. Told her she can call back and leave message with nursing staff about what is going on. Looks like she has an appt with Dr. Carlis Abbott on 01/21/14 at 12 pm now, per referrals. Let pt know this.

## 2014-01-01 NOTE — Telephone Encounter (Signed)
Pt returning a call for [employer]. CB # [phone]

## 2014-01-01 NOTE — Telephone Encounter (Signed)
Called pt. She says her endocrinologist does not want to see her anymore. She is worried because she has not had her blood checked in a while. When she was last here I made a referral back to her endocrinologist Dr. Carlis Abbott. It says in referrals that she has an upcoming appt with him on 01/21/14 at 11:30 am. She was not aware of this. Pt will call her endocrinologist's office to see if she has this appt. If not, she will call back.

## 2014-04-26 ENCOUNTER — Telehealth: Payer: Self-pay | Admitting: Physician Assistant

## 2014-04-26 ENCOUNTER — Ambulatory Visit (INDEPENDENT_AMBULATORY_CARE_PROVIDER_SITE_OTHER): Payer: 59 | Admitting: Physician Assistant

## 2014-04-26 VITALS — BP 110/60 | HR 78 | Temp 98.6°F | Resp 18 | Ht 61.0 in | Wt 113.0 lb

## 2014-04-26 DIAGNOSIS — R590 Localized enlarged lymph nodes: Secondary | ICD-10-CM | POA: Diagnosis not present

## 2014-04-26 DIAGNOSIS — L905 Scar conditions and fibrosis of skin: Secondary | ICD-10-CM
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
## 2014-04-26 NOTE — Patient Instructions (Signed)
Swollen Lymph Nodes  The lymphatic system filters fluid from around cells. It is like a system of blood vessels. These channels carry lymph instead of blood. The lymphatic system is an important part of the immune (disease fighting) system. When people talk about "swollen glands in the neck," they are usually talking about swollen lymph nodes. The lymph nodes are like the little traps for infection. You and your caregiver may be able to feel lymph nodes, especially swollen nodes, in these common areas: the groin (inguinal area), armpits (axilla), and above the clavicle (supraclavicular). You may also feel them in the neck (cervical) and the back of the head just above the hairline (occipital).  Swollen glands occur when there is any condition in which the body responds with an allergic type of reaction. For instance, the glands in the neck can become swollen from insect bites or any type of minor infection on the head. These are very noticeable in children with only minor problems. Lymph nodes may also become swollen when there is a tumor or problem with the lymphatic system, such as Hodgkin's disease.  TREATMENT    Most swollen glands do not require treatment. They can be observed (watched) for a short period of time, if your caregiver feels it is necessary. Most of the time, observation is not necessary.   Antibiotics (medicines that kill germs) may be prescribed by your caregiver. Your caregiver may prescribe these if he or she feels the swollen glands are due to a bacterial (germ) infection. Antibiotics are not used if the swollen glands are caused by a virus.  HOME CARE INSTRUCTIONS    Take medications as directed by your caregiver. Only take over-the-counter or prescription medicines for pain, discomfort, or fever as directed by your caregiver.  SEEK MEDICAL CARE IF:    If you begin to run a temperature greater than 102 F (38.9 C), or as your caregiver suggests.  MAKE SURE YOU:    Understand these  instructions.   Will watch your condition.   Will get help right away if you are not doing well or get worse.  Document Released: 12/30/2001 Document Revised: 04/03/2011 Document Reviewed: 01/09/2005  ExitCare Patient Information 2015 [employer]. This information is not intended to replace advice given to you by your health care provider. Make sure you discuss any questions you have with your health care provider.

## 2014-04-26 NOTE — Telephone Encounter (Signed)
Patient states that she has a question about her health. Wants return call from [place].  [phone]

## 2014-04-26 NOTE — Telephone Encounter (Signed)
Left vmail as I was returning her call.

## 2014-04-27 NOTE — Telephone Encounter (Signed)
Pt wanted to make an appt for a CPE. Transferred to make an appt

## 2014-05-04 ENCOUNTER — Ambulatory Visit (INDEPENDENT_AMBULATORY_CARE_PROVIDER_SITE_OTHER): Payer: 59 | Admitting: Physician Assistant

## 2014-05-04 VITALS — BP 118/62 | HR 88 | Temp 98.3°F | Resp 16 | Ht 61.0 in | Wt 112.6 lb

## 2014-05-04 DIAGNOSIS — N898 Other specified noninflammatory disorders of vagina: Secondary | ICD-10-CM

## 2014-05-04 LAB — POCT WET PREP WITH KOH
Clue Cells Wet Prep HPF POC: NEGATIVE
KOH PREP POC: NEGATIVE
Trichomonas, UA: NEGATIVE
Yeast Wet Prep HPF POC: NEGATIVE

## 2014-05-04 LAB — POCT URINE PREGNANCY: Preg Test, Ur: NEGATIVE

## 2014-05-04 NOTE — Progress Notes (Signed)
Subjective:    Patient ID: Anne Newton, female    DOB: [birth year], 24 y.o.   MRN: [id number]  HPI Patient presents for STD screening following unprotected sexual encounter 3 weeks ago. Now has greenish, yellow discharge. Discharge does not have an odor or has not notice blood. Denies abdominal pain, urinary/bowel sx, or fever. LMP 03/27/14 was normal. Cycle usually every 28 days so is worried about potential pregnancy. Has had two female partners this year, but this is the first time she has had unprotected sex. Has never had a pelvic or pap exam.   Review of Systems  Constitutional: Negative for fever and chills.  Gastrointestinal: Negative for nausea, vomiting, abdominal pain, diarrhea and constipation.  Genitourinary: Positive for vaginal discharge and menstrual problem. Negative for dysuria, urgency, frequency, hematuria, flank pain, decreased urine volume, vaginal bleeding, difficulty urinating, genital sores, vaginal pain, pelvic pain and dyspareunia.       Objective:   Physical Exam  Constitutional: She is oriented to person, place, and time. She appears well-developed and well-nourished. No distress.  Blood pressure 118/62, pulse 88, temperature 98.3 F (36.8 C), temperature source Oral, resp. rate 16, height 5\' 1"  (1.549 m), weight 112 lb 9.6 oz (51.075 kg), last menstrual period 03/29/2014, SpO2 99 %.  HENT:  Head: Normocephalic and atraumatic.  Right Ear: External ear normal.  Left Ear: External ear normal.  Eyes: Conjunctivae are normal. Right eye exhibits no discharge. Left eye exhibits no discharge.  Cardiovascular: Normal rate, regular rhythm and normal heart sounds.  Exam reveals no gallop and no friction rub.   No murmur heard. Pulmonary/Chest: Effort normal and breath sounds normal. No respiratory distress. She has no wheezes. She has no rales.  Abdominal: Soft. Bowel sounds are normal. She exhibits no distension. There is tenderness in the suprapubic area. There is no  rigidity, no rebound, no guarding, no CVA tenderness, no tenderness at McBurney's point and negative Murphy's sign. No hernia. Hernia confirmed negative in the right inguinal area and confirmed negative in the left inguinal area.  Genitourinary: No labial fusion. There is no rash, tenderness, lesion or injury on the right labia. There is no rash, tenderness, lesion or injury on the left labia. Uterus is not deviated, not enlarged, not fixed and not tender. Cervix exhibits no motion tenderness, no discharge and no friability. Right adnexum displays no mass, no tenderness and no fullness. Left adnexum displays no mass, no tenderness and no fullness. No erythema, tenderness or bleeding in the vagina. No signs of injury around the vagina. Vaginal discharge (copious greenish discharge) found.  Lymphadenopathy:       Right: No inguinal adenopathy present.       Left: No inguinal adenopathy present.  Neurological: She is alert and oriented to person, place, and time.  Skin: Skin is warm and dry. No rash noted. She is not diaphoretic. No erythema. No pallor.   Results for orders placed or performed in visit on 05/04/14  POCT Wet Prep with KOH  Result Value Ref Range   Trichomonas, UA Negative    Clue Cells Wet Prep HPF POC neg    Epithelial Wet Prep HPF POC 2-4    Yeast Wet Prep HPF POC neg    Bacteria Wet Prep HPF POC trace    RBC Wet Prep HPF POC 0-2    WBC Wet Prep HPF POC 6-10    KOH Prep POC Negative   POCT urine pregnancy  Result Value Ref Range   Preg Test,  Ur Negative        Assessment & Plan:  1. Vaginal discharge POC labs negative. Will wait for other lab results to confirm any diagnoses. Discussed safe sex practices. Patient not ready to start [facility] at this time.  - POCT Wet Prep with KOH - POCT urine pregnancy - GC/Chlamydia Probe Amp - RPR - HIV antibody (with reflex)   Alveta Heimlich PA-C  [facility] 05/04/2014 7:32 PM

## 2014-05-05 LAB — HIV ANTIBODY (ROUTINE TESTING W REFLEX): HIV: NONREACTIVE

## 2014-05-06 LAB — GC/CHLAMYDIA PROBE AMP
CT Probe RNA: NEGATIVE
GC Probe RNA: NEGATIVE

## 2014-05-06 LAB — SYPHILIS: RPR W/REFLEX TO RPR TITER AND TREPONEMAL ANTIBODIES, TRADITIONAL SCREENING AND DIAGNOSIS ALGORITHM

## 2014-05-11 ENCOUNTER — Encounter: Payer: Self-pay | Admitting: Physician Assistant

## 2014-06-24 DIAGNOSIS — J342 Deviated nasal septum: Secondary | ICD-10-CM

## 2014-06-24 DIAGNOSIS — J343 Hypertrophy of nasal turbinates: Secondary | ICD-10-CM

## 2014-06-24 HISTORY — DX: Hypertrophy of nasal turbinates: J34.3

## 2014-06-24 HISTORY — DX: Deviated nasal septum: J34.2

## 2014-06-25 ENCOUNTER — Encounter (HOSPITAL_BASED_OUTPATIENT_CLINIC_OR_DEPARTMENT_OTHER): Payer: Self-pay | Admitting: *Deleted

## 2014-06-26 NOTE — Pre-Procedure Instructions (Signed)
Maudie Mercury will be interpreter for pt., per Bethena Roys at [facility]; please call [phone] if surgery time changes.

## 2014-06-29 ENCOUNTER — Other Ambulatory Visit: Payer: Self-pay | Admitting: Otolaryngology

## 2014-06-29 NOTE — H&P (Signed)
PREOPERATIVE H&P  Chief Complaint: trouble breathing through the right nose  HPI: Anne Newton is a 24 y.o. female who presents for evaluation of trouble breathing through the right side of the nose. On exam she has a fairly severe septal deviation to the right and large turbinates. She's taken to the OR for septoplasty and turbinate reductions.  Past Medical History  Diagnosis Date  . Hypothyroidism   . History of thyroid cancer 2014  . Nasal septal deviation 06/2014  . Nasal turbinate hypertrophy 06/2014  . Asthma     prn inhaler   Past Surgical History  Procedure Laterality Date  . Thyroidectomy Left 08/28/2012    Procedure: LEFT THYROID LOBECTOMY;  Surgeon: Rozetta Nunnery, MD;  Location: [facility];  Service: ENT;  Laterality: Left;  . Thyroidectomy Right 10/21/2012    Procedure: COMPLETION THYROIDECTOMY (RIGHT THYROID LOBECTOMY);  Surgeon: Rozetta Nunnery, MD;  Location: [facility];  Service: ENT;  Laterality: Right;   History   Social History  . Marital Status: Single    Spouse Name: N/A  . Number of Children: N/A  . Years of Education: N/A   Social History Main Topics  . Smoking status: Never Smoker   . Smokeless tobacco: Never Used  . Alcohol Use: No  . Drug Use: No  . Sexual Activity: Yes    Birth Control/ Protection: None   Other Topics Concern  . None   Social History Narrative   History reviewed. No pertinent family history. Allergies  Allergen Reactions  . Chicken Allergy Shortness Of Breath    EXACERBATES ASTHMA   Prior to Admission medications   Medication Sig Start Date End Date Taking? Authorizing Provider  levothyroxine (SYNTHROID, LEVOTHROID) 75 MCG tablet Take 75 mcg by mouth daily before breakfast.   Yes Historical Provider, MD  albuterol (PROVENTIL HFA;VENTOLIN HFA) 108 (90 BASE) MCG/ACT inhaler Inhale 2 puffs into the lungs every 6 (six) hours as needed for wheezing.    Historical Provider, MD     Positive ROS: per HPI  All other systems  have been reviewed and were otherwise negative with the exception of those mentioned in the HPI and as above.  Physical Exam: There were no vitals filed for this visit.  General: Alert, no acute distress Oral: Normal oral mucosa and tonsils Nasal: Septal deviation to the right .  Enlarged turbinates. No poylps or lesions. Neck: No palpable adenopathy or thyroid nodules Ear: Ear canal is clear with normal appearing TMs Cardiovascular: Regular rate and rhythm, no murmur.  Respiratory: Clear to auscultation Neurologic: Alert and oriented x 3   Assessment/Plan: SEPTUM DEVIATION,HYPERTROPHY Plan for Procedure(s): SEPTOPLASTY TURBINATE REDUCTION   Melony Overly, MD 06/29/2014 3:59 PM

## 2014-06-30 ENCOUNTER — Ambulatory Visit (HOSPITAL_BASED_OUTPATIENT_CLINIC_OR_DEPARTMENT_OTHER): Payer: 59 | Admitting: Anesthesiology

## 2014-06-30 ENCOUNTER — Encounter (HOSPITAL_BASED_OUTPATIENT_CLINIC_OR_DEPARTMENT_OTHER): Payer: Self-pay

## 2014-06-30 ENCOUNTER — Ambulatory Visit (HOSPITAL_BASED_OUTPATIENT_CLINIC_OR_DEPARTMENT_OTHER)
Admission: RE | Admit: 2014-06-30 | Discharge: 2014-06-30 | Disposition: A | Discharge status: Home / Self Care | Payer: 59 | Source: Ambulatory Visit | Attending: Otolaryngology | Admitting: Otolaryngology

## 2014-06-30 ENCOUNTER — Encounter (HOSPITAL_BASED_OUTPATIENT_CLINIC_OR_DEPARTMENT_OTHER)
Admission: RE | Disposition: A | Discharge status: Home / Self Care | Payer: Self-pay | Source: Ambulatory Visit | Attending: Otolaryngology

## 2014-06-30 DIAGNOSIS — E039 Hypothyroidism, unspecified: Secondary | ICD-10-CM | POA: Insufficient documentation

## 2014-06-30 DIAGNOSIS — J342 Deviated nasal septum: Secondary | ICD-10-CM | POA: Diagnosis not present

## 2014-06-30 DIAGNOSIS — J45909 Unspecified asthma, uncomplicated: Secondary | ICD-10-CM | POA: Diagnosis not present

## 2014-06-30 DIAGNOSIS — Z85850 Personal history of malignant neoplasm of thyroid: Secondary | ICD-10-CM | POA: Insufficient documentation

## 2014-06-30 DIAGNOSIS — J343 Hypertrophy of nasal turbinates: Secondary | ICD-10-CM | POA: Diagnosis not present

## 2014-06-30 DIAGNOSIS — Z79899 Other long term (current) drug therapy: Secondary | ICD-10-CM | POA: Insufficient documentation

## 2014-06-30 HISTORY — PX: SEPTOPLASTY: SHX2393

## 2014-06-30 HISTORY — DX: Deviated nasal septum: J34.2

## 2014-06-30 HISTORY — DX: Hypothyroidism, unspecified: E03.9

## 2014-06-30 HISTORY — PX: TURBINATE REDUCTION: SHX6157

## 2014-06-30 HISTORY — DX: Hypertrophy of nasal turbinates: J34.3

## 2014-06-30 LAB — POCT HEMOGLOBIN-HEMACUE: Hemoglobin: 13.9 g/dL (ref 12.0–15.0)

## 2014-06-30 SURGERY — SEPTOPLASTY, NOSE
Anesthesia: General | Site: Nose

## 2014-06-30 MED ORDER — FENTANYL CITRATE (PF) 100 MCG/2ML IJ SOLN: 50.0000 ug | INTRAMUSCULAR | Status: DC | PRN

## 2014-06-30 MED ORDER — SUCCINYLCHOLINE CHLORIDE 20 MG/ML IJ SOLN
INTRAMUSCULAR | Status: DC | PRN
  Administered 2014-06-30: 100 mg via INTRAVENOUS

## 2014-06-30 MED ORDER — BACITRACIN ZINC 500 UNIT/GM EX OINT
TOPICAL_OINTMENT | CUTANEOUS | Status: DC | PRN
  Administered 2014-06-30: 1 via TOPICAL

## 2014-06-30 MED ORDER — ONDANSETRON HCL 4 MG/2ML IJ SOLN
INTRAMUSCULAR | Status: AC
  Filled 2014-06-30: qty 2

## 2014-06-30 MED ORDER — ONDANSETRON HCL 4 MG/2ML IJ SOLN
INTRAMUSCULAR | Status: DC | PRN
  Administered 2014-06-30: 4 mg via INTRAVENOUS

## 2014-06-30 MED ORDER — SUFENTANIL CITRATE 50 MCG/ML IV SOLN
INTRAVENOUS | Status: AC
  Filled 2014-06-30: qty 1

## 2014-06-30 MED ORDER — MIDAZOLAM HCL 2 MG/2ML IJ SOLN
INTRAMUSCULAR | Status: AC
  Filled 2014-06-30: qty 2

## 2014-06-30 MED ORDER — ONDANSETRON HCL 4 MG/2ML IJ SOLN
4.0000 mg | Freq: Four times a day (QID) | INTRAMUSCULAR | Status: AC | PRN
  Administered 2014-06-30: 4 mg via INTRAVENOUS

## 2014-06-30 MED ORDER — LIDOCAINE HCL (CARDIAC) 20 MG/ML IV SOLN
INTRAVENOUS | Status: DC | PRN
  Administered 2014-06-30: 50 mg via INTRAVENOUS

## 2014-06-30 MED ORDER — SUFENTANIL CITRATE 50 MCG/ML IV SOLN
INTRAVENOUS | Status: DC | PRN
  Administered 2014-06-30: 10 ug via INTRAVENOUS

## 2014-06-30 MED ORDER — HYDROCODONE-ACETAMINOPHEN 5-325 MG PO TABS: 1.0000 | ORAL_TABLET | Freq: Four times a day (QID) | ORAL | Status: DC | PRN

## 2014-06-30 MED ORDER — LACTATED RINGERS IV SOLN
INTRAVENOUS | Status: DC
  Administered 2014-06-30 (×3): via INTRAVENOUS

## 2014-06-30 MED ORDER — PROPOFOL 10 MG/ML IV BOLUS
INTRAVENOUS | Status: DC | PRN
  Administered 2014-06-30: 150 mg via INTRAVENOUS

## 2014-06-30 MED ORDER — DEXAMETHASONE SODIUM PHOSPHATE 4 MG/ML IJ SOLN
INTRAMUSCULAR | Status: DC | PRN
  Administered 2014-06-30: 8 mg via INTRAVENOUS

## 2014-06-30 MED ORDER — MIDAZOLAM HCL 2 MG/2ML IJ SOLN
1.0000 mg | INTRAMUSCULAR | Status: DC | PRN
  Administered 2014-06-30: 1 mg via INTRAVENOUS

## 2014-06-30 MED ORDER — PHENYLEPHRINE HCL 10 MG/ML IJ SOLN
INTRAMUSCULAR | Status: DC | PRN
  Administered 2014-06-30: 40 ug via INTRAVENOUS

## 2014-06-30 MED ORDER — HYDROMORPHONE HCL 1 MG/ML IJ SOLN: 0.2500 mg | INTRAMUSCULAR | Status: DC | PRN

## 2014-06-30 MED ORDER — CEPHALEXIN 500 MG PO CAPS: 500.0000 mg | ORAL_CAPSULE | Freq: Two times a day (BID) | ORAL | Status: DC

## 2014-06-30 MED ORDER — LIDOCAINE-EPINEPHRINE 1 %-1:100000 IJ SOLN
INTRAMUSCULAR | Status: DC | PRN
  Administered 2014-06-30: 9 mL

## 2014-06-30 MED ORDER — GLYCOPYRROLATE 0.2 MG/ML IJ SOLN
0.2000 mg | Freq: Once | INTRAMUSCULAR | Status: DC | PRN
Start: 2014-06-30 — End: 2014-06-30

## 2014-06-30 MED ORDER — PROPOFOL 500 MG/50ML IV EMUL
INTRAVENOUS | Status: AC
  Filled 2014-06-30: qty 50

## 2014-06-30 MED ORDER — OXYMETAZOLINE HCL 0.05 % NA SOLN
NASAL | Status: AC
  Filled 2014-06-30: qty 15

## 2014-06-30 MED ORDER — OXYMETAZOLINE HCL 0.05 % NA SOLN
NASAL | Status: DC | PRN
  Administered 2014-06-30: 1

## 2014-06-30 MED ORDER — LIDOCAINE-EPINEPHRINE 1 %-1:100000 IJ SOLN
INTRAMUSCULAR | Status: AC
  Filled 2014-06-30: qty 1

## 2014-06-30 MED ORDER — METOCLOPRAMIDE HCL 5 MG/ML IJ SOLN
10.0000 mg | Freq: Once | INTRAMUSCULAR | Status: AC
  Administered 2014-06-30: 10 mg via INTRAVENOUS

## 2014-06-30 MED ORDER — OXYCODONE HCL 5 MG PO TABS: 5.0000 mg | ORAL_TABLET | Freq: Once | ORAL | Status: DC | PRN

## 2014-06-30 MED ORDER — CEFAZOLIN SODIUM-DEXTROSE 2-3 GM-% IV SOLR
2.0000 g | INTRAVENOUS | Status: AC
  Administered 2014-06-30: 2 g via INTRAVENOUS

## 2014-06-30 MED ORDER — METOCLOPRAMIDE HCL 5 MG/ML IJ SOLN
INTRAMUSCULAR | Status: AC
Start: 2014-06-30 — End: 2014-06-30
  Filled 2014-06-30: qty 2

## 2014-06-30 MED ORDER — OXYCODONE HCL 5 MG/5ML PO SOLN: 5.0000 mg | Freq: Once | ORAL | Status: DC | PRN

## 2014-06-30 MED ORDER — BACITRACIN ZINC 500 UNIT/GM EX OINT
TOPICAL_OINTMENT | CUTANEOUS | Status: AC
  Filled 2014-06-30: qty 28.35

## 2014-06-30 SURGICAL SUPPLY — 41 items
APPLICATOR COTTON TIP 6IN STRL (MISCELLANEOUS) IMPLANT
ATTRACTOMAT 16X20 MAGNETIC DRP (DRAPES) IMPLANT
BLADE INF TURB ROT M4 2 5PK (BLADE) ×2 IMPLANT
BLADE SURG 15 STRL LF DISP TIS (BLADE) ×1 IMPLANT
BLADE SURG 15 STRL SS (BLADE) ×1
CANISTER SUCT 1200ML W/VALVE (MISCELLANEOUS) ×2 IMPLANT
COAGULATOR SUCT 8FR VV (MISCELLANEOUS) IMPLANT
COVER MAYO STAND STRL (DRAPES) ×2 IMPLANT
DECANTER SPIKE VIAL GLASS SM (MISCELLANEOUS) IMPLANT
DRSG TELFA 3X8 NADH (GAUZE/BANDAGES/DRESSINGS) ×2 IMPLANT
ELECT REM PT RETURN 9FT ADLT (ELECTROSURGICAL)
ELECTRODE REM PT RTRN 9FT ADLT (ELECTROSURGICAL) IMPLANT
GLOVE BIO SURGEON STRL SZ 6.5 (GLOVE) ×2 IMPLANT
GLOVE BIOGEL PI IND STRL 7.0 (GLOVE) ×3 IMPLANT
GLOVE BIOGEL PI INDICATOR 7.0 (GLOVE) ×3
GLOVE SS BIOGEL STRL SZ 7.5 (GLOVE) ×1 IMPLANT
GLOVE SUPERSENSE BIOGEL SZ 7.5 (GLOVE) ×1
GOWN STRL REUS W/ TWL LRG LVL3 (GOWN DISPOSABLE) ×3 IMPLANT
GOWN STRL REUS W/TWL LRG LVL3 (GOWN DISPOSABLE) ×3
IV NS 500ML (IV SOLUTION) ×1
IV NS 500ML BAXH (IV SOLUTION) ×1 IMPLANT
NEEDLE PRECISIONGLIDE 27X1.5 (NEEDLE) ×2 IMPLANT
NS IRRIG 1000ML POUR BTL (IV SOLUTION) ×2 IMPLANT
PACK BASIN DAY SURGERY FS (CUSTOM PROCEDURE TRAY) ×2 IMPLANT
PACK ENT DAY SURGERY (CUSTOM PROCEDURE TRAY) ×2 IMPLANT
PATTIES SURGICAL .5 X3 (DISPOSABLE) ×2 IMPLANT
SHEET MEDIUM DRAPE 40X70 STRL (DRAPES) ×2 IMPLANT
SHEET SILASTIC 8X6X.030 25-30 (MISCELLANEOUS) IMPLANT
SLEEVE SCD COMPRESS KNEE MED (MISCELLANEOUS) ×2 IMPLANT
SPONGE GAUZE 2X2 8PLY STRL LF (GAUZE/BANDAGES/DRESSINGS) IMPLANT
SUT CHROMIC 4 0 PS 2 18 (SUTURE) ×2 IMPLANT
SUT ETHILON 3 0 PS 1 (SUTURE) IMPLANT
SUT SILK 2 0 FS (SUTURE) ×2 IMPLANT
SUT VIC AB 4-0 P-3 18XBRD (SUTURE) IMPLANT
SUT VIC AB 4-0 P3 18 (SUTURE)
SYR 3ML 18GX1 1/2 (SYRINGE) IMPLANT
SYR CONTROL 10ML LL (SYRINGE) IMPLANT
TOWEL OR 17X24 6PK STRL BLUE (TOWEL DISPOSABLE) ×2 IMPLANT
TRAY DSU PREP LF (CUSTOM PROCEDURE TRAY) ×2 IMPLANT
TUBE CONNECTING 20X1/4 (TUBING) ×2 IMPLANT
YANKAUER SUCT BULB TIP NO VENT (SUCTIONS) ×2 IMPLANT

## 2014-06-30 NOTE — Anesthesia Preprocedure Evaluation (Signed)
Anesthesia Evaluation  Patient identified by MRN, date of birth, ID band Patient awake    Reviewed: Allergy & Precautions, NPO status , Patient's Chart, lab work & pertinent test results  Airway Mallampati: II   Neck ROM: full    Dental   Pulmonary asthma ,  breath sounds clear to auscultation        Cardiovascular negative cardio ROS  Rhythm:regular Rate:Normal     Neuro/Psych    GI/Hepatic   Endo/Other  Hypothyroidism   Renal/GU      Musculoskeletal   Abdominal   Peds  Hematology   Anesthesia Other Findings   Reproductive/Obstetrics                             Anesthesia Physical Anesthesia Plan  ASA: II  Anesthesia Plan: General   Post-op Pain Management:    Induction: Intravenous  Airway Management Planned: Oral ETT  Additional Equipment:   Intra-op Plan:   Post-operative Plan: Extubation in OR  Informed Consent: I have reviewed the patients History and Physical, chart, labs and discussed the procedure including the risks, benefits and alternatives for the proposed anesthesia with the patient or authorized representative who has indicated his/her understanding and acceptance.     Plan Discussed with: CRNA, Anesthesiologist and Surgeon  Anesthesia Plan Comments:         Anesthesia Quick Evaluation

## 2014-06-30 NOTE — Discharge Instructions (Addendum)
Take your regular meds Tylenol, motrin or hydrocodone prn pain Keflex 500 mg twice per day for 1 week   Start tonight Return to see Dr Lucia Gaskins tomorrow at 11:45 to have your nasal packing removed You can apply a cool compress to the bridge of your nose to reduce bleeding and swelling    Post Anesthesia Home Care Instructions  Activity: Get plenty of rest for the remainder of the day. A responsible adult should stay with you for 24 hours following the procedure.  For the next 24 hours, DO NOT: -Drive a car -Paediatric nurse -Drink alcoholic beverages -Take any medication unless instructed by your physician -Make any legal decisions or sign important papers.  Meals: Start with liquid foods such as gelatin or soup. Progress to regular foods as tolerated. Avoid greasy, spicy, heavy foods. If nausea and/or vomiting occur, drink only clear liquids until the nausea and/or vomiting subsides. Call your physician if vomiting continues.  Special Instructions/Symptoms: Your throat may feel dry or sore from the anesthesia or the breathing tube placed in your throat during surgery. If this causes discomfort, gargle with warm salt water. The discomfort should disappear within 24 hours.  If you had a scopolamine patch placed behind your ear for the management of post- operative nausea and/or vomiting:  1. The medication in the patch is effective for 72 hours, after which it should be removed.  Wrap patch in a tissue and discard in the trash. Wash hands thoroughly with soap and water. 2. You may remove the patch earlier than 72 hours if you experience unpleasant side effects which may include dry mouth, dizziness or visual disturbances. 3. Avoid touching the patch. Wash your hands with soap and water after contact with the patch.

## 2014-06-30 NOTE — Transfer of Care (Signed)
Immediate Anesthesia Transfer of Care Note  [name]  Procedure(s) Performed: Procedure(s): SEPTOPLASTY (N/A) TURBINATE REDUCTION (N/A)  Patient Location: PACU  Anesthesia Type:General  Level of Consciousness: awake, alert  and oriented  Airway & Oxygen Therapy: Patient Spontanous Breathing and Patient connected to face mask oxygen  Post-op Assessment: Report given to RN and Post -op Vital signs reviewed and stable  Post vital signs: Reviewed and stable  Last Vitals:  Filed Vitals:   06/30/14 0920  BP:   Pulse: 123  Temp:   Resp:     Complications: No apparent anesthesia complications

## 2014-06-30 NOTE — Brief Op Note (Signed)
06/30/2014  9:11 AM  PATIENT:  Anne Newton  24 y.o. female  PRE-OPERATIVE DIAGNOSIS:  SEPTUM DEVIATION,HYPERTROPHY OF TURBINATES  POST-OPERATIVE DIAGNOSIS:  SEPTUM DEVIATION,HYPERTROPHY  PROCEDURE:  Procedure(s): SEPTOPLASTY (N/A) TURBINATE REDUCTION (N/A)  SURGEON:  Surgeon(s) and Role:    * Rozetta Nunnery, MD - Primary  PHYSICIAN ASSISTANT:   ASSISTANTS: none   ANESTHESIA:   general  EBL:  Total I/O In: 1000 [I.V.:1000] Out: -   BLOOD ADMINISTERED:none  DRAINS: none   LOCAL MEDICATIONS USED:  XYLOCAINE   SPECIMEN:  No Specimen  DISPOSITION OF SPECIMEN:  N/A  COUNTS:  YES  TOURNIQUET:  * No tourniquets in log *  DICTATION: .Other Dictation: Dictation Number [id number]  PLAN OF CARE: Discharge to home after PACU  PATIENT DISPOSITION:  PACU - hemodynamically stable.   Delay start of Pharmacological VTE agent (>24hrs) due to surgical blood loss or risk of bleeding: yes

## 2014-06-30 NOTE — Anesthesia Procedure Notes (Signed)
Procedure Name: Intubation Date/Time: 06/30/2014 7:39 AM Performed by: Melynda Ripple D Pre-anesthesia Checklist: Patient identified, Emergency Drugs available, Suction available and Patient being monitored Patient Re-evaluated:Patient Re-evaluated prior to inductionOxygen Delivery Method: Circle System Utilized Preoxygenation: Pre-oxygenation with 100% oxygen Intubation Type: IV induction Ventilation: Mask ventilation without difficulty Laryngoscope Size: Mac and 3 Grade View: Grade I Tube type: Oral Tube size: 7.0 mm Number of attempts: 1 Airway Equipment and Method: Stylet and Oral airway Placement Confirmation: ETT inserted through vocal cords under direct vision,  positive ETCO2 and breath sounds checked- equal and bilateral Secured at: 22 cm Tube secured with: Tape Dental Injury: Teeth and Oropharynx as per pre-operative assessment

## 2014-06-30 NOTE — Anesthesia Postprocedure Evaluation (Signed)
Anesthesia Post Note  [name]  Procedure(s) Performed: Procedure(s) (LRB): SEPTOPLASTY (N/A) TURBINATE REDUCTION (N/A)  Anesthesia type: General  Patient location: PACU  Post pain: Pain level controlled and Adequate analgesia  Post assessment: Post-op Vital signs reviewed, Patient's Cardiovascular Status Stable, Respiratory Function Stable, Patent Airway and Pain level controlled  Last Vitals:  Filed Vitals:   06/30/14 1030  BP: 118/83  Pulse: 76  Temp:   Resp: 21    Post vital signs: Reviewed and stable  Level of consciousness: awake, alert  and oriented  Complications: No apparent anesthesia complications

## 2014-06-30 NOTE — Interval H&P Note (Signed)
History and Physical Interval Note:  06/30/2014 7:21 AM  Anne Newton  has presented today for surgery, with the diagnosis of SEPTUM DEVIATION,HYPERTROPHY  The various methods of treatment have been discussed with the patient and family. After consideration of risks, benefits and other options for treatment, the patient has consented to  Procedure(s): SEPTOPLASTY (N/A) TURBINATE REDUCTION (N/A) as a surgical intervention .  The patient's history has been reviewed, patient examined, no change in status, stable for surgery.  I have reviewed the patient's chart and labs.  Questions were answered to the patient's satisfaction.     NEWMAN, CHRISTOPHER

## 2014-07-01 NOTE — Op Note (Signed)
[name], Anne Newton NO.:  [id number]  MEDICAL RECORD NO.:  [id number]  LOCATION:                                FACILITY:  [facility]  PHYSICIAN:  Leonides Sake. Lucia Gaskins, M.D.DATE OF BIRTH:  05-21-1990  DATE OF PROCEDURE:  06/30/2014 DATE OF DISCHARGE:  06/30/2014                              OPERATIVE REPORT   PREOPERATIVE DIAGNOSES: 1. Septal deviation to the right with right-sided nasal obstruction. 2. Turbinate hypertrophy.  POSTOPERATIVE DIAGNOSES: 1. Septal deviation to the right with right-sided nasal obstruction. 2. Turbinate hypertrophy.  OPERATIONS PERFORMED:  Septoplasty with bilateral inferior turbinate reductions.  SURGEON:  Leonides Sake. Lucia Gaskins, M.D.  ANESTHESIA:  General endotracheal.  COMPLICATIONS:  None.  BRIEF CLINICAL NOTE:  Anne Newton is a 24 year old female who has had chronic right-sided nasal obstruction.  She is really unable to breathe much through the right side of her nose.  On exam, she has a fairly severe septal deviation to the right with a large septal spur and septum protruding to the right inferior turbinate.  She has a large compensatory turbinate hypertrophy of the left inferior turbinate. There are no polyps or obstructive lesions otherwise within the nose. She was taken to the operating room at this time for septoplasty and bilateral inferior turbinate reductions.  DESCRIPTION OF PROCEDURE:  After adequate endotracheal anesthesia, the patient received 2 g Ancef IV preoperatively as well as 8 mg of Decadron.  The nose was prepped with Betadine solution and draped out with sterile towels.  The septum and turbinates were then injected with Xylocaine with epinephrine for hemostasis.  A hemitransfixion incision was made along the caudal edge of the septum on the right side. Mucoperichondrial and mucoperiosteal flaps were elevated posterior on the right side.  She had a very thin anterior cartilaginous septum.  The septum  bowed severely to the right side including cartilaginous as well as bony septum.  The severely deviated cartilaginous and bony septum were then removed after elevating the mucoperichondrial and mucoperiosteal flaps on either side of the deviation.  In addition, the maxillary crest had bowed into the right side more posteriorly and mucoperiosteal flaps were elevated off the maxillary crest along the floor of the nose and the deviated portion of the maxillary crest was removed.  This allowed the septum to return much more favorably to a midline.  Using the [employer] turbinate blade on the left side, the submucosal reduction of the left inferior turbinate was performed.  The remaining turbinate bone was outfractured and suction cautery was used for hemostasis and to cauterize the distal portion of the left inferior turbinate.  On the right side, again minor submucosal reduction of the right inferior turbinate was performed.  A small strip of the posterior inferior turbinate was amputated on the left side and then the right side.  Then, suction cautery was used for hemostasis and the turbinate bone was outfractured.  This completed the procedure.  The hemitransfixion incision was closed with interrupted 4-0 chromic sutures.  The nose was then packed with Telfa soaked in bacitracin ointment, placed on either side of the septum.  The patient was awoke from anesthesia  and transferred to the recovery room, postop doing well.  DISPOSITION:  Anne Newton was discharged home later this morning on Keflex 500 mg b.i.d. for 1 week, Tylenol, Motrin, hydrocodone 5-mg tablets 1 to 2 q.6 hours p.r.n. pain.  I will have her follow up in my office tomorrow to have her nasal packs removed.  We will follow up in 10 days for recheck and cleaning nose.          ______________________________ Leonides Sake. Lucia Gaskins, M.D.     CEN/MEDQ  D:  06/30/2014  T:  06/30/2014  Job:  [id number]  cc:   Leonides Sake. Lucia Gaskins,  M.D.

## 2014-07-02 ENCOUNTER — Encounter (HOSPITAL_BASED_OUTPATIENT_CLINIC_OR_DEPARTMENT_OTHER): Payer: Self-pay | Admitting: Otolaryngology

## 2014-09-28 ENCOUNTER — Ambulatory Visit: Payer: 59

## 2014-09-28 ENCOUNTER — Ambulatory Visit (INDEPENDENT_AMBULATORY_CARE_PROVIDER_SITE_OTHER): Payer: 59 | Admitting: Physician Assistant

## 2014-09-28 VITALS — BP 102/64 | HR 95 | Temp 98.1°F | Resp 16 | Ht 61.25 in | Wt 114.4 lb

## 2014-09-28 DIAGNOSIS — Z124 Encounter for screening for malignant neoplasm of cervix: Secondary | ICD-10-CM

## 2014-09-28 DIAGNOSIS — Z7251 High risk heterosexual behavior: Secondary | ICD-10-CM | POA: Diagnosis not present

## 2014-09-28 DIAGNOSIS — Z23 Encounter for immunization: Secondary | ICD-10-CM | POA: Diagnosis not present

## 2014-09-28 LAB — POCT URINALYSIS DIPSTICK
Bilirubin, UA: NEGATIVE
Blood, UA: NEGATIVE
Glucose, UA: NEGATIVE
KETONES UA: NEGATIVE
LEUKOCYTES UA: NEGATIVE
NITRITE UA: NEGATIVE
PH UA: 6
PROTEIN UA: NEGATIVE
Spec Grav, UA: 1.02
Urobilinogen, UA: 0.2

## 2014-09-28 LAB — POCT WET PREP WITH KOH
CLUE CELLS WET PREP PER HPF POC: NEGATIVE
KOH Prep POC: NEGATIVE
TRICHOMONAS UA: NEGATIVE
Yeast Wet Prep HPF POC: NEGATIVE

## 2014-09-28 LAB — POCT URINE PREGNANCY: PREG TEST UR: NEGATIVE

## 2014-09-28 LAB — RPR

## 2014-09-28 LAB — HIV ANTIBODY (ROUTINE TESTING W REFLEX): HIV 1&2 Ab, 4th Generation: NONREACTIVE

## 2014-09-28 IMAGING — NM NM [ID] THYROID CANCER METS WHOLE BODY W/ THYROGEN
6 series · 6 of 6 positions shown · non-contrast
Comparison: Whole-body scan of 11/29/2012

CLINICAL DATA: History papillary thyroid carcinoma. Patient

EXAM:
THYROGEN-STIMULATED 0-5T5 WHOLE BODY SCAN
TECHNIQUE: The patient received 0.9 mg Thyrogen intramuscularly every 24 hours
for two doses. On the third day the patient returned and received
the radiopharmaceutical, per orally. On the fifth day, the patient
returned and whole body planar images were obtained in the anterior
and posterior projections.
RADIOPHARMACEUTICALS:  4.0 mNiR-XOX sodium iodide

[Series 1: marker · 1.04mm/px · 1 of 1 slices shown (1 of 2)]
[im 1/1]
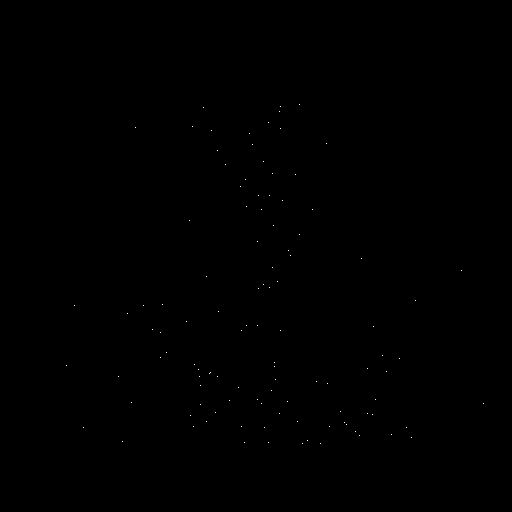

[Series 1: marker · 1.04mm/px · 1 of 1 slices shown (2 of 2)]
[im 1/1]
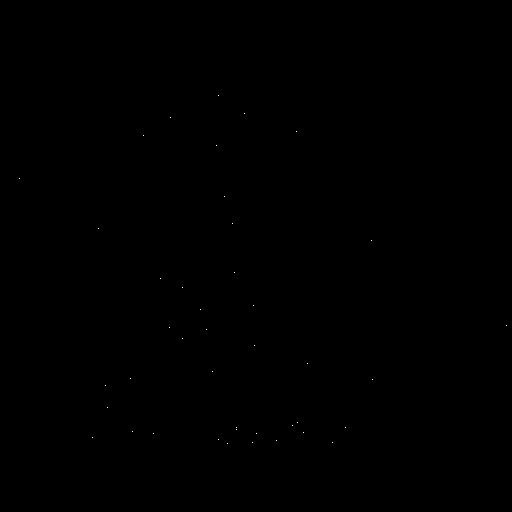

[Series 2: static thyroid no marker · 1.04mm/px · 1 of 1 slices shown (1 of 2)]
[im 1/1  full-range]
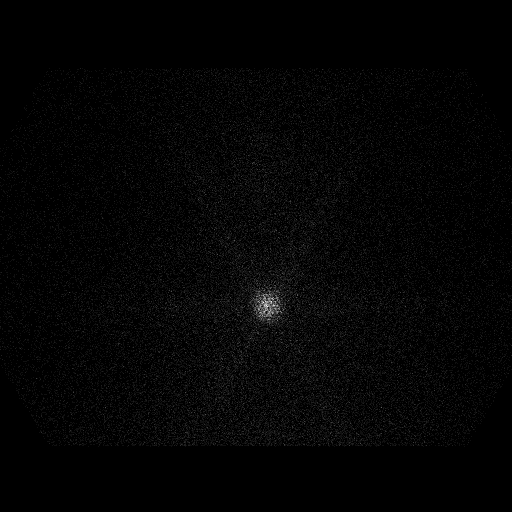

[Series 2: static thyroid no marker · 1.04mm/px · 1 of 1 slices shown (2 of 2)]
[im 1/1]
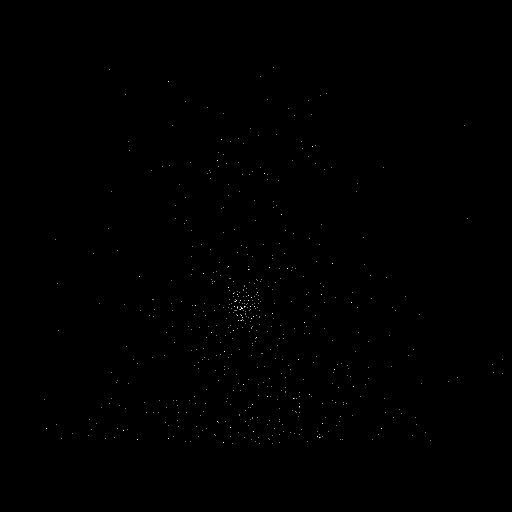

[Series 3: i131 whole body · 2.66mm/px · 1 of 1 slices shown (1 of 2)]
[im 1/1  full-range]
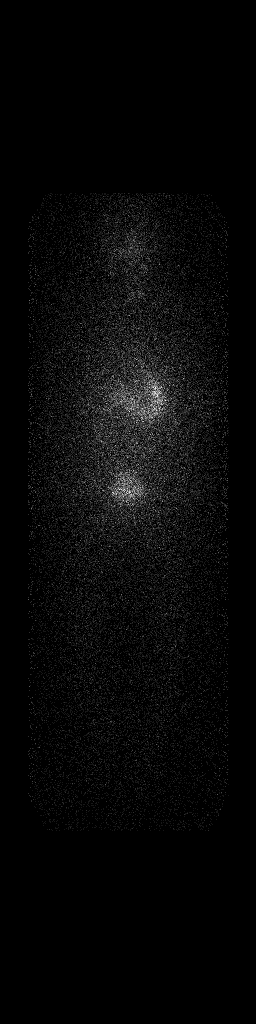

[Series 3: i131 whole body · 2.66mm/px · 1 of 1 slices shown (2 of 2)]
[im 1/1  full-range]
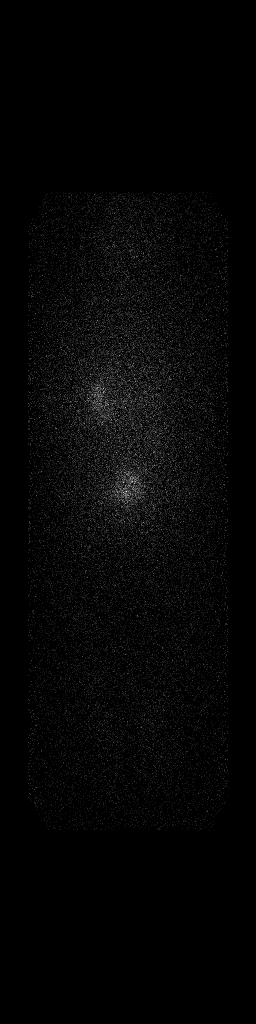

[6 of 6 positions shown; findings below may reference images not displayed]

FINDINGS: There is no significant uptake within the thyroid bed. No abnormal
uptake within the whole-body scan. Physiologic uptake noted in the
GI and GU tract.
IMPRESSION: No evidence of local thyroid cancer recurrence or distant
metastasis.

## 2014-09-28 MED ORDER — ALBUTEROL SULFATE HFA 108 (90 BASE) MCG/ACT IN AERS: 2.0000 | INHALATION_SPRAY | Freq: Four times a day (QID) | RESPIRATORY_TRACT | Status: DC | PRN

## 2014-09-28 NOTE — Patient Instructions (Signed)

## 2014-09-28 NOTE — Progress Notes (Signed)
Subjective:    Patient ID: Anne Newton, female    DOB: 1990-07-10, 24 y.o.   MRN: [id number]  HPI Patient presents for STD/pregnancy check, refill of albuterol inhaler, and for flu vaccination.  Endorses unprotected sex with previous partner who has had sex with another individual 2 weeks ago. Denies vaginal or urinary sx, however, would like to make sure she is clear. LMP 09/07/14 was normal. Has never had a pap.   Has had to use inhaler more with the season change, but denies wheezing or SOB. Denies cough or flare in sx at night.   Would like flu vaccine. Has had vaccination in the past without reaction. Eats eggs and egg products without reaction. Only allergy is to chicken and has SOB.   Review of Systems  Constitutional: Negative for fever and chills.  Gastrointestinal: Negative for nausea, vomiting and abdominal pain.  Genitourinary: Negative for dysuria, urgency, frequency, hematuria, flank pain, decreased urine volume, vaginal bleeding, vaginal discharge, difficulty urinating, vaginal pain, menstrual problem and pelvic pain.  Musculoskeletal: Negative for back pain.  Skin: Negative.        Objective:   Physical Exam  Constitutional: She is oriented to person, place, and time. She appears well-developed and well-nourished. No distress.  Blood pressure 102/64, pulse 95, temperature 98.1 F (36.7 C), temperature source Oral, resp. rate 16, height 5' 1.25" (1.556 m), weight 114 lb 6.4 oz (51.891 kg), last menstrual period 09/07/2014, SpO2 99 %.  HENT:  Head: Normocephalic and atraumatic.  Right Ear: External ear normal.  Left Ear: External ear normal.  Mouth/Throat: Oropharynx is clear and moist.  Eyes: Conjunctivae and EOM are normal. Pupils are equal, round, and reactive to light. Right eye exhibits no discharge. Left eye exhibits no discharge. No scleral icterus.  Cardiovascular: Normal rate, regular rhythm and normal heart sounds.  Exam reveals no gallop and no friction rub.    No murmur heard. Pulmonary/Chest: Effort normal and breath sounds normal. No respiratory distress. She has no wheezes. She has no rales. She exhibits no tenderness.  Abdominal: Soft. Bowel sounds are normal. She exhibits no distension and no mass. There is no tenderness. There is no rebound and no guarding. Hernia confirmed negative in the right inguinal area and confirmed negative in the left inguinal area.  Genitourinary: Uterus normal. Cervix exhibits no motion tenderness, no discharge and no friability. Right adnexum displays no mass, no tenderness and no fullness. Left adnexum displays no mass, no tenderness and no fullness. No erythema, tenderness or bleeding in the vagina. No foreign body around the vagina. No signs of injury around the vagina. Vaginal discharge (mostly clear discharge with small amount of yellowish discharge) found.    Musculoskeletal: She exhibits no tenderness.  Lymphadenopathy:    She has no cervical adenopathy.       Right: No inguinal adenopathy present.       Left: No inguinal adenopathy present.  Neurological: She is alert and oriented to person, place, and time.  Skin: Skin is warm and dry. No rash noted. She is not diaphoretic. No erythema. No pallor.  Psychiatric: She has a normal mood and affect. Her behavior is normal. Judgment and thought content normal.     Results for orders placed or performed in visit on 09/28/14  POCT Wet Prep with KOH  Result Value Ref Range   Trichomonas, UA Negative    Clue Cells Wet Prep HPF POC neg    Epithelial Wet Prep HPF POC Few Few, Moderate,  Many   Yeast Wet Prep HPF POC neg    Bacteria Wet Prep HPF POC Few None, Few   RBC Wet Prep HPF POC 0-1    WBC Wet Prep HPF POC 5-10    KOH Prep POC Negative   POCT urine pregnancy  Result Value Ref Range   Preg Test, Ur Negative Negative  POCT urinalysis dipstick  Result Value Ref Range   Color, UA yellow    Clarity, UA clear    Glucose, UA neg    Bilirubin, UA neg     Ketones, UA neg    Spec Grav, UA 1.020    Blood, UA neg    pH, UA 6.0    Protein, UA neg    Urobilinogen, UA 0.2    Nitrite, UA neg    Leukocytes, UA Negative Negative       Assessment & Plan:  1. Unprotected sex Discussed the importance of safe sex again. Explained that may be too soon to do pregnancy test and repeat may be warranted. - POCT Wet Prep with KOH - POCT urine pregnancy - POCT urinalysis dipstick - HIV antibody - RPR  2. Screening for cervical cancer - Pap IG, CT/NG w/ reflex HPV when ASC-U  3. Need for prophylactic vaccination and inoculation against influenza - Flu Vaccine QUAD 36+ mos IM   Emmalise Huard PA-C  [facility] 09/28/2014 1:11 PM

## 2014-09-29 ENCOUNTER — Ambulatory Visit: Payer: 59 | Admitting: Physician Assistant

## 2014-09-30 LAB — PAP IG, CT-NG, RFX HPV ASCU
CHLAMYDIA PROBE AMP: POSITIVE — AB
GC PROBE AMP: NEGATIVE

## 2014-10-02 ENCOUNTER — Telehealth: Payer: Self-pay | Admitting: Physician Assistant

## 2014-10-02 DIAGNOSIS — A749 Chlamydial infection, unspecified: Secondary | ICD-10-CM

## 2014-10-02 MED ORDER — AZITHROMYCIN 500 MG PO TABS: 1000.0000 mg | ORAL_TABLET | Freq: Once | ORAL | Status: DC

## 2014-10-02 NOTE — Telephone Encounter (Signed)
Relayed lab results. Positive for chlamydia. Azithromycin sent to pharmacy. Advised against intercourse for next week. Advised should have partner treated as well. Discussed safe sex practices again.

## 2014-10-19 ENCOUNTER — Ambulatory Visit (INDEPENDENT_AMBULATORY_CARE_PROVIDER_SITE_OTHER): Payer: 59 | Admitting: Emergency Medicine

## 2014-10-19 VITALS — BP 88/60 | HR 74 | Temp 98.1°F | Resp 16 | Ht 61.25 in | Wt 113.0 lb

## 2014-10-19 DIAGNOSIS — E890 Postprocedural hypothyroidism: Secondary | ICD-10-CM | POA: Diagnosis not present

## 2014-10-19 NOTE — Progress Notes (Signed)
Subjective:  Patient ID: Anne Newton, female    DOB: 05-Sep-1990  Age: 24 y.o. MRN: [id number]  CC: Referral   HPI Samanatha T Nass presents  increased come requesting a referral to an endocrinologist apparently she had an insurance change she has a history of thyroid cancer with thyroidectomy and is on replacement. She was instructed to follow-up with [place] to get the referral so she go back and see her regular endocrinologist  History Phoenyx has a past medical history of Hypothyroidism; History of thyroid cancer (2014); Nasal septal deviation (06/2014); Nasal turbinate hypertrophy (06/2014); and Asthma.   She has past surgical history that includes Thyroidectomy (Left, 08/28/2012); Thyroidectomy (Right, 10/21/2012); Septoplasty (N/A, 06/30/2014); and Turbinate reduction (N/A, 06/30/2014).   Her  family history is not on file.  She   reports that she has never smoked. She has never used smokeless tobacco. She reports that she does not drink alcohol or use illicit drugs.  Outpatient Prescriptions Prior to Visit  Medication Sig Dispense Refill  . albuterol (PROVENTIL HFA;VENTOLIN HFA) 108 (90 BASE) MCG/ACT inhaler Inhale 2 puffs into the lungs every 6 (six) hours as needed for wheezing. 1 Inhaler 1  . levothyroxine (SYNTHROID, LEVOTHROID) 75 MCG tablet Take 75 mcg by mouth daily before breakfast.    . azithromycin (ZITHROMAX) 500 MG tablet Take 2 tablets (1,000 mg total) by mouth once. (Patient not taking: Reported on 10/19/2014) 2 tablet 0  . HYDROcodone-acetaminophen (NORCO/VICODIN) 5-325 MG per tablet Take 1-2 tablets by mouth every 6 (six) hours as needed for moderate pain. (Patient not taking: Reported on 09/28/2014) 20 tablet 0   No facility-administered medications prior to visit.    Social History   Social History  . Marital Status: Single    Spouse Name: N/A  . Number of Children: N/A  . Years of Education: N/A   Social History Main Topics  . Smoking status: Never Smoker   . Smokeless  tobacco: Never Used  . Alcohol Use: No  . Drug Use: No  . Sexual Activity: Yes    Birth Control/ Protection: None   Other Topics Concern  . None   Social History Narrative     Review of Systems  Constitutional: Negative for fever, chills and appetite change.  HENT: Negative for congestion, ear pain, postnasal drip, sinus pressure and sore throat.   Eyes: Negative for pain and redness.  Respiratory: Negative for cough, shortness of breath and wheezing.   Cardiovascular: Negative for leg swelling.  Gastrointestinal: Negative for nausea, vomiting, abdominal pain, diarrhea, constipation and blood in stool.  Endocrine: Negative for polyuria.  Genitourinary: Negative for dysuria, urgency, frequency and flank pain.  Musculoskeletal: Negative for gait problem.  Skin: Negative for rash.  Neurological: Negative for weakness and headaches.  Psychiatric/Behavioral: Negative for confusion and decreased concentration. The patient is not nervous/anxious.     Objective:  BP 88/60 mmHg  Pulse 74  Temp(Src) 98.1 F (36.7 C) (Oral)  Resp 16  Ht 5' 1.25" (1.556 m)  Wt 113 lb (51.256 kg)  BMI 21.17 kg/m2  SpO2 97%  LMP 10/17/2014  Physical Exam  Constitutional: She is oriented to person, place, and time. She appears well-developed and well-nourished.  HENT:  Head: Normocephalic and atraumatic.  Eyes: Conjunctivae are normal. Pupils are equal, round, and reactive to light.  Pulmonary/Chest: Effort normal.  Musculoskeletal: She exhibits no edema.  Neurological: She is alert and oriented to person, place, and time.  Skin: Skin is dry.  Psychiatric: She has  a normal mood and affect. Her behavior is normal. Thought content normal.      Assessment & Plan:   Volanda was seen today for referral.  Diagnoses and all orders for this visit:  Postoperative hypothyroidism -     Ambulatory referral to Endocrinology   I am having Ms. Gellerman maintain her levothyroxine, HYDROcodone-acetaminophen,  albuterol, and azithromycin.  No orders of the defined types were placed in this encounter.    Appropriate red flag conditions were discussed with the patient as well as actions that should be taken.  Patient expressed his understanding.  Follow-up: Return if symptoms worsen or fail to improve.  Roselee Culver, MD

## 2014-10-19 NOTE — Patient Instructions (Signed)
Hypothyroidism The thyroid is a large gland located in the lower front of your neck. The thyroid gland helps control metabolism. Metabolism is how your body handles food. It controls metabolism with the hormone thyroxine. When this gland is underactive (hypothyroid), it produces too little hormone.  CAUSES These include:   Absence or destruction of thyroid tissue.  Goiter due to iodine deficiency.  Goiter due to medications.  Congenital defects (since birth).  Problems with the pituitary. This causes a lack of TSH (thyroid stimulating hormone). This hormone tells the thyroid to turn out more hormone. SYMPTOMS  Lethargy (feeling as though you have no energy)  Cold intolerance  Weight gain (in spite of normal food intake)  Dry skin  Coarse hair  Menstrual irregularity (if severe, may lead to infertility)  Slowing of thought processes Cardiac problems are also caused by insufficient amounts of thyroid hormone. Hypothyroidism in the newborn is cretinism, and is an extreme form. It is important that this form be treated adequately and immediately or it will lead rapidly to retarded physical and mental development. DIAGNOSIS  To prove hypothyroidism, your caregiver may do blood tests and ultrasound tests. Sometimes the signs are hidden. It may be necessary for your caregiver to watch this illness with blood tests either before or after diagnosis and treatment. TREATMENT  Low levels of thyroid hormone are increased by using synthetic thyroid hormone. This is a safe, effective treatment. It usually takes about four weeks to gain the full effects of the medication. After you have the full effect of the medication, it will generally take another four weeks for problems to leave. Your caregiver may start you on low doses. If you have had heart problems the dose may be gradually increased. It is generally not an emergency to get rapidly to normal. HOME CARE INSTRUCTIONS   Take your  medications as your caregiver suggests. Let your caregiver know of any medications you are taking or start taking. Your caregiver will help you with dosage schedules.  As your condition improves, your dosage needs may increase. It will be necessary to have continuing blood tests as suggested by your caregiver.  Report all suspected medication side effects to your caregiver. SEEK MEDICAL CARE IF: Seek medical care if you develop:  Sweating.  Tremulousness (tremors).  Anxiety.  Rapid weight loss.  Heat intolerance.  Emotional swings.  Diarrhea.  Weakness. SEEK IMMEDIATE MEDICAL CARE IF:  You develop chest pain, an irregular heart beat (palpitations), or a rapid heart beat. MAKE SURE YOU:   Understand these instructions.  Will watch your condition.  Will get help right away if you are not doing well or get worse. Document Released: 01/09/2005 Document Revised: 04/03/2011 Document Reviewed: 08/30/2007 ExitCare Patient Information 2015 [employer]. This information is not intended to replace advice given to you by your health care provider. Make sure you discuss any questions you have with your health care provider.  

## 2014-12-14 ENCOUNTER — Ambulatory Visit (INDEPENDENT_AMBULATORY_CARE_PROVIDER_SITE_OTHER): Payer: 59 | Admitting: Family Medicine

## 2014-12-14 VITALS — BP 110/68 | HR 66 | Temp 98.6°F | Resp 18 | Ht 61.0 in | Wt 118.0 lb

## 2014-12-14 DIAGNOSIS — Z23 Encounter for immunization: Secondary | ICD-10-CM | POA: Diagnosis not present

## 2014-12-14 DIAGNOSIS — Z131 Encounter for screening for diabetes mellitus: Secondary | ICD-10-CM | POA: Diagnosis not present

## 2014-12-14 DIAGNOSIS — Z0000 Encounter for general adult medical examination without abnormal findings: Secondary | ICD-10-CM

## 2014-12-14 DIAGNOSIS — Z113 Encounter for screening for infections with a predominantly sexual mode of transmission: Secondary | ICD-10-CM

## 2014-12-14 DIAGNOSIS — Z130 Encounter for screening for diseases of the blood and blood-forming organs and certain disorders involving the immune mechanism: Secondary | ICD-10-CM

## 2014-12-14 LAB — COMPREHENSIVE METABOLIC PANEL
ALK PHOS: 51 U/L (ref 33–115)
ALT: 22 U/L (ref 6–29)
AST: 22 U/L (ref 10–30)
Albumin: 4.4 g/dL (ref 3.6–5.1)
BUN: 14 mg/dL (ref 7–25)
CO2: 28 mmol/L (ref 20–31)
Calcium: 9.6 mg/dL (ref 8.6–10.2)
Chloride: 103 mmol/L (ref 98–110)
Creat: 0.58 mg/dL (ref 0.50–1.10)
Glucose, Bld: 49 mg/dL — ABNORMAL LOW (ref 65–99)
Potassium: 4.3 mmol/L (ref 3.5–5.3)
Sodium: 138 mmol/L (ref 135–146)
Total Bilirubin: 1.2 mg/dL (ref 0.2–1.2)
Total Protein: 7.3 g/dL (ref 6.1–8.1)

## 2014-12-14 NOTE — Patient Instructions (Signed)
It was good to see you today I will be in touch with your labs- we will make sure that your chlamydia is cleared up.   I would recommend that you use some sort of protection to prevent pregnancy and also disease.  Condoms are good, but must be used EVERY time you have sex.  If you prefer there are other perhaps easier to use options such as birth control pills or the patch.  If you do not use protection you are very likely to become pregnant

## 2014-12-14 NOTE — Progress Notes (Signed)
[facility] [address], [place] [address] [phone]  Date:  12/14/2014   Name:  Anne Newton   DOB:  09/20/1990   MRN:  [id number]  PCP:  Foye Spurling, MD    Chief Complaint: Annual Exam and Allergies   History of Present Illness:  Anne Newton is a 24 y.o. very pleasant female patient who presents with the following:  Here today for a CPE.  She has a history of thyroidectomy in 2014 for thyroid cancer.  She is on thyroid replacement for this and reports regular follow-up visits for surveillance.    Flu shot: done in September Tetanus shot: she does not know date of last shot Pap: in September- negative but she did have chlamydia,  Would like a TOC today nevative HIV, RPR this year She is a non- smoker LMP 10/24 Works as a Geophysicist/field seismologist  She is not fasting today She is somewhat curious about how to prevent STI and pregnancy, but is very concerned about what SE there might be to contraception  Invited and answered all questions today  Patient Active Problem List   Diagnosis Date Noted  . Thyroid cancer ([place]) 12/16/2013  . Hypothyroidism 12/16/2013  . Unspecified asthma(493.90) 07/03/2012  . Enlargement of lymph nodes 07/03/2012    Past Medical History  Diagnosis Date  . Hypothyroidism   . History of thyroid cancer 2014  . Nasal septal deviation 06/2014  . Nasal turbinate hypertrophy 06/2014  . Asthma     prn inhaler    Past Surgical History  Procedure Laterality Date  . Thyroidectomy Left 08/28/2012    Procedure: LEFT THYROID LOBECTOMY;  Surgeon: Rozetta Nunnery, MD;  Location: [facility];  Service: ENT;  Laterality: Left;  . Thyroidectomy Right 10/21/2012    Procedure: COMPLETION THYROIDECTOMY (RIGHT THYROID LOBECTOMY);  Surgeon: Rozetta Nunnery, MD;  Location: [facility];  Service: ENT;  Laterality: Right;  . Septoplasty N/A 06/30/2014    Procedure: SEPTOPLASTY;  Surgeon: Rozetta Nunnery, MD;  Location: [facility];   Service: ENT;  Laterality: N/A;  . Turbinate reduction N/A 06/30/2014    Procedure: TURBINATE REDUCTION;  Surgeon: Rozetta Nunnery, MD;  Location: [facility];  Service: ENT;  Laterality: N/A;    Social History  Substance Use Topics  . Smoking status: Never Smoker   . Smokeless tobacco: Never Used  . Alcohol Use: No    History reviewed. No pertinent family history.  Allergies  Allergen Reactions  . Chicken Allergy Shortness Of Breath    EXACERBATES ASTHMA    Medication list has been reviewed and updated.  Current Outpatient Prescriptions on File Prior to Visit  Medication Sig Dispense Refill  . albuterol (PROVENTIL HFA;VENTOLIN HFA) 108 (90 BASE) MCG/ACT inhaler Inhale 2 puffs into the lungs every 6 (six) hours as needed for wheezing. 1 Inhaler 1  . levothyroxine (SYNTHROID, LEVOTHROID) 75 MCG tablet Take 75 mcg by mouth daily before breakfast.    . azithromycin (ZITHROMAX) 500 MG tablet Take 2 tablets (1,000 mg total) by mouth once. (Patient not taking: Reported on 10/19/2014) 2 tablet 0  . HYDROcodone-acetaminophen (NORCO/VICODIN) 5-325 MG per tablet Take 1-2 tablets by mouth every 6 (six) hours as needed for moderate pain. (Patient not taking: Reported on 09/28/2014) 20 tablet 0   No current facility-administered medications on file prior to visit.    Review of Systems:  As per HPI- otherwise negative.   Physical Examination: Filed Vitals:  12/14/14 1126  BP: 110/68  Pulse: 66  Temp: 98.6 F (37 C)  Resp: 18   Filed Vitals:   12/14/14 1126  Height: 5\' 1"  (1.549 m)  Weight: 118 lb (53.524 kg)   Body mass index is 22.31 kg/(m^2). Ideal Body Weight: Weight in (lb) to have BMI = 25: 132  GEN: WDWN, NAD, Non-toxic, A & O x 3, slim build, looks well HEENT: Atraumatic, Normocephalic. Neck supple. No masses, No LAD.  Bilateral TM wnl, oropharynx normal.  PEERL,EOMI.   Ears and Nose: No external deformity. CV: RRR, No M/G/R. No JVD. No thrill. No  extra heart sounds. PULM: CTA B, no wheezes, crackles, rhonchi. No retractions. No resp. distress. No accessory muscle use. ABD: S, NT, ND. No rebound. No HSM. EXTR: No c/c/e NEURO Normal gait.  PSYCH: Normally interactive. Conversant. Not depressed or anxious appearing.  Calm demeanor.  Pelvic: normal, no vaginal lesions or discharge. Cervix normal   Assessment and Plan: Physical exam  Screening for STD (sexually transmitted disease) - Plan: Hepatitis B surface antibody, Hepatitis B surface antigen, Hepatitis C antibody, GC/Chlamydia Probe Amp  Screening for deficiency anemia - Plan: CBC  Screening for diabetes mellitus - Plan: Comprehensive metabolic panel  Immunization due - Plan: Tdap vaccine greater than or equal to 7yo IM  STI screening and other labs as above today Discussed contraception, gave a hand-out and encouraged her to seriously consider a form of reliable contraception.  Right now she uses condoms some of the time. Educated her that pregnancy is likely to occur if she does not take active steps to prevent it.  She was open to this information but not willing to start any contraceptive today  Will plan further follow- up pending labs.   Signed Lamar Blinks, MD

## 2014-12-15 ENCOUNTER — Encounter: Payer: Self-pay | Admitting: Family Medicine

## 2014-12-15 DIAGNOSIS — Z789 Other specified health status: Secondary | ICD-10-CM | POA: Insufficient documentation

## 2014-12-15 LAB — CBC
HCT: 44.4 % (ref 36.0–46.0)
Hemoglobin: 14.9 g/dL (ref 12.0–15.0)
MCH: 29.9 pg (ref 26.0–34.0)
MCHC: 33.6 g/dL (ref 30.0–36.0)
MCV: 89 fL (ref 78.0–100.0)
MPV: 10 fL (ref 8.6–12.4)
PLATELETS: 223 10*3/uL (ref 150–400)
RBC: 4.99 MIL/uL (ref 3.87–5.11)
RDW: 12.7 % (ref 11.5–15.5)
WBC: 7.4 10*3/uL (ref 4.0–10.5)

## 2014-12-15 LAB — GC/CHLAMYDIA PROBE AMP
CT PROBE, AMP APTIMA: NEGATIVE
GC PROBE AMP APTIMA: NEGATIVE

## 2014-12-15 LAB — HEPATITIS B SURFACE ANTIBODY, QUANTITATIVE: Hepatitis B-Post: 1000 m[IU]/mL

## 2014-12-15 LAB — HEPATITIS B SURFACE ANTIGEN: Hepatitis B Surface Ag: NEGATIVE

## 2014-12-15 LAB — HEPATITIS C ANTIBODY: HCV Ab: NEGATIVE

## 2015-01-19 ENCOUNTER — Ambulatory Visit (INDEPENDENT_AMBULATORY_CARE_PROVIDER_SITE_OTHER): Payer: 59 | Admitting: Family Medicine

## 2015-01-19 VITALS — BP 110/74 | HR 84 | Temp 98.1°F | Resp 18 | Ht 61.75 in | Wt 119.4 lb

## 2015-01-19 DIAGNOSIS — L270 Generalized skin eruption due to drugs and medicaments taken internally: Secondary | ICD-10-CM

## 2015-01-19 DIAGNOSIS — L298 Other pruritus: Secondary | ICD-10-CM

## 2015-01-19 DIAGNOSIS — R5383 Other fatigue: Secondary | ICD-10-CM | POA: Diagnosis not present

## 2015-01-19 DIAGNOSIS — N898 Other specified noninflammatory disorders of vagina: Secondary | ICD-10-CM

## 2015-01-19 LAB — TSH: TSH: 12.15 u[IU]/mL — AB (ref 0.350–4.500)

## 2015-01-19 LAB — POCT WET + KOH PREP
TRICH BY WET PREP: ABSENT
YEAST BY WET PREP: ABSENT
Yeast by KOH: ABSENT

## 2015-01-19 LAB — GLUCOSE, POCT (MANUAL RESULT ENTRY): POC Glucose: 85 mg/dl (ref 70–99)

## 2015-01-19 LAB — POCT URINE PREGNANCY: PREG TEST UR: NEGATIVE

## 2015-01-19 MED ORDER — CLOTRIMAZOLE-BETAMETHASONE 1-0.05 % EX CREA: 1.0000 "application " | TOPICAL_CREAM | Freq: Every day | CUTANEOUS | Status: DC

## 2015-01-19 MED ORDER — FLUCONAZOLE 150 MG PO TABS: 150.0000 mg | ORAL_TABLET | Freq: Once | ORAL | Status: DC

## 2015-01-19 MED ORDER — HYDROXYZINE HCL 25 MG PO TABS: 12.5000 mg | ORAL_TABLET | Freq: Three times a day (TID) | ORAL | Status: DC | PRN

## 2015-01-19 NOTE — Progress Notes (Signed)
[facility] [address] [place] [address] [phone]  Date:  01/19/2015   Name:  Anne Newton   DOB:  September 16, 1990   MRN:  [id number]  PCP:  Anne Spurling, MD  Chief Complaint  Patient presents with  . Vaginal Itching    x2 weeks. Had intercourse with someone and noticed the itching afterwards   . Vaginal Discharge  . Fatigue    History of Present Illness:  Anne Newton is a 24 y.o. female patient who presents to [facility] for cc of vaginal pruritus.   --itching white discharge since last night, extremely pruritic.  No rash that she has seen.  Discharge non-odorous.  No bleeding.    --No dysuria, hematuria, or frequency.  --sexually active unprotected.  No use of contraception.  Patient's last menstrual period was 12/31/2014.  Hx of chlamydia 3 months ago where she was treated.  Patient states that she started douching directly after sex, although pruritus did not start until last night.   Patient reports fatigue.  Hx of thyroid cancer, with thyroidectomy.  Patient takes Synthroid compliantly.  Denies chest pains, palpitaitons, shortness of breath.  No recent malaise.   She would like to discuss ocp choices.  Concern that she does not want to take as it may effect her blood and eggs.  She appears to be more open to the patch.      Patient Active Problem List   Diagnosis Date Noted  . Hepatitis B immune 12/15/2014  . Thyroid cancer ([place]) 12/16/2013  . Hypothyroidism 12/16/2013  . Unspecified asthma(493.90) 07/03/2012  . Enlargement of lymph nodes 07/03/2012    Past Medical History  Diagnosis Date  . Hypothyroidism   . History of thyroid cancer 2014  . Nasal septal deviation 06/2014  . Nasal turbinate hypertrophy 06/2014  . Asthma     prn inhaler    Past Surgical History  Procedure Laterality Date  . Thyroidectomy Left 08/28/2012    Procedure: LEFT THYROID LOBECTOMY;  Surgeon: Rozetta Nunnery, MD;  Location: [place];  Service: ENT;  Laterality: Left;    . Thyroidectomy Right 10/21/2012    Procedure: COMPLETION THYROIDECTOMY (RIGHT THYROID LOBECTOMY);  Surgeon: Rozetta Nunnery, MD;  Location: [place];  Service: ENT;  Laterality: Right;  . Septoplasty N/A 06/30/2014    Procedure: SEPTOPLASTY;  Surgeon: Rozetta Nunnery, MD;  Location: [place];  Service: ENT;  Laterality: N/A;  . Turbinate reduction N/A 06/30/2014    Procedure: TURBINATE REDUCTION;  Surgeon: Rozetta Nunnery, MD;  Location: [place];  Service: ENT;  Laterality: N/A;    Social History  Substance Use Topics  . Smoking status: Never Smoker   . Smokeless tobacco: Never Used  . Alcohol Use: No    History reviewed. No pertinent family history.  Allergies  Allergen Reactions  . Chicken Allergy Shortness Of Breath    EXACERBATES ASTHMA    Medication list has been reviewed and updated.  Current Outpatient Prescriptions on File Prior to Visit  Medication Sig Dispense Refill  . albuterol (PROVENTIL HFA;VENTOLIN HFA) 108 (90 BASE) MCG/ACT inhaler Inhale 2 puffs into the lungs every 6 (six) hours as needed for wheezing. 1 Inhaler 1  . levothyroxine (SYNTHROID, LEVOTHROID) 75 MCG tablet Take 75 mcg by mouth daily before breakfast.     No current facility-administered medications on file prior to visit.    ROS ROS otherwise unremarkable unless listed above.   Physical Examination:  BP 110/74 mmHg  Pulse 84  Temp(Src) 98.1 F (36.7 C) (Oral)  Resp 18  Ht 5' 1.75" (1.568 m)  Wt 119 lb 6.4 oz (54.159 kg)  BMI 22.03 kg/m2  SpO2 98%  LMP 12/31/2014 Ideal Body Weight: Weight in (lb) to have BMI = 25: 135.3  Physical Exam  Constitutional: She is oriented to person, place, and time. She appears well-developed and well-nourished. No distress.  HENT:  Head: Normocephalic and atraumatic.  Right Ear: External ear normal.  Left Ear: External ear normal.  Eyes: Conjunctivae and EOM are normal. Pupils are equal, round, and reactive to  light.  Cardiovascular: Normal rate.   Pulmonary/Chest: Effort normal. No respiratory distress.  Genitourinary: Uterus normal.    Pelvic exam was performed with patient supine. There is rash on the right labia. There is rash on the left labia. Cervix exhibits discharge (yellow discharge nono-frothy) and friability. Cervix exhibits no motion tenderness. No signs of injury around the vagina. Vaginal discharge found.  Erythema along the labia majora without papules or open lesions.  Hair located in this area.    Neurological: She is alert and oriented to person, place, and time.  Skin: She is not diaphoretic.  Psychiatric: She has a normal mood and affect. Her behavior is normal.    Results for orders placed or performed in visit on 01/19/15  POCT Wet + KOH Prep  Result Value Ref Range   Yeast by KOH Absent Present, Absent   Yeast by wet prep Absent Present, Absent   WBC by wet prep Moderate (A) None, Few, Too numerous to count   Clue Cells Wet Prep HPF POC None None, Too numerous to count   Trich by wet prep Absent Present, Absent   Bacteria Wet Prep HPF POC Few None, Few, Too numerous to count   Epithelial Cells By [employer] ([facility]) None None, Few, Too numerous to count   RBC,UR,HPF,POC None None RBC/hpf  POCT urine pregnancy  Result Value Ref Range   Preg Test, Ur Negative Negative  POCT glucose (manual entry)  Result Value Ref Range   POC Glucose 85 70 - 99 mg/dl      Assessment and Plan: Anne Newton is a 24 y.o. female who is here today for chief complaint of vaginal pruritus.   Diffidx: allergic dermatitis secondary to douching agent, std, yeast not seen. lotrisone given to apply only for less than a week.  Hoping this will rid some of the inflammation Given hydroxyzine for pruritus.  Advised of sedating properties. Given diflucan 1 tablet to insure yeast not unseen. -gc chlamydia test done today.   -discussed at length choices of contraception.  Patient reports concern of  ocp effecting her "eggs".  Education spent, however patient still reluctant.  Advised to use condoms at all times.  Will need to follow up on ocp use.    Vaginal pruritus - Plan: POCT Wet + KOH Prep, GC/Chlamydia Probe Amp, POCT urine pregnancy, fluconazole (DIFLUCAN) 150 MG tablet, clotrimazole-betamethasone (LOTRISONE) cream, hydrOXYzine (ATARAX/VISTARIL) 25 MG tablet  Other fatigue - Plan: TSH, POCT glucose (manual entry)  Dermatitis due to drug reaction - Plan: clotrimazole-betamethasone (LOTRISONE) cream, hydrOXYzine (ATARAX/VISTARIL) 25 MG tablet   Ivar Drape, PA-C [facility] 01/19/2015 9:52 AM  Patient reviewed with Mrs. English. Agree with plan and assessment  Signed, Carola Frost.D.

## 2015-01-19 NOTE — Patient Instructions (Signed)
Diflucan-take one today.  This is for possible yeast infection Lotrisone-Use this on the outside area of the vagina that is red.  Use this once per day.  Only use this for 1 week. Hydroxyzine-take this, by mouth, for the itching.   This may be a sexually transmitted disease, like chlamydia--so we will send the test to the lab.  I will be in touch with you about this. This may also be due to allergic reaction to the product you used after sex.  Please stop using this-- throw it away.

## 2015-01-20 LAB — GC/CHLAMYDIA PROBE AMP
CT Probe RNA: NOT DETECTED
GC Probe RNA: NOT DETECTED

## 2015-01-25 ENCOUNTER — Telehealth: Payer: Self-pay | Admitting: Physician Assistant

## 2015-01-25 ENCOUNTER — Ambulatory Visit (INDEPENDENT_AMBULATORY_CARE_PROVIDER_SITE_OTHER): Payer: Self-pay | Admitting: Family Medicine

## 2015-01-25 VITALS — BP 108/72 | HR 71 | Temp 98.1°F | Resp 14 | Ht 61.0 in | Wt 120.6 lb

## 2015-01-25 DIAGNOSIS — F4321 Adjustment disorder with depressed mood: Secondary | ICD-10-CM

## 2015-01-25 DIAGNOSIS — N760 Acute vaginitis: Principal | ICD-10-CM

## 2015-01-25 DIAGNOSIS — B9689 Other specified bacterial agents as the cause of diseases classified elsewhere: Secondary | ICD-10-CM

## 2015-01-25 DIAGNOSIS — Z3009 Encounter for other general counseling and advice on contraception: Secondary | ICD-10-CM

## 2015-01-25 NOTE — Telephone Encounter (Signed)
-----   Message from Constance Goltz, [place] sent at 01/25/2015  2:39 PM EST ----- Spoke with pt. She is currently on 75 mcg. Her "thyroid doctor" increased her to 23 but she was unable to afford new rx. She is going to pick it up this week and start the 18mcg

## 2015-01-25 NOTE — Progress Notes (Signed)
[facility] [address] [place] [address] [phone]  Date:  01/25/2015   Name:  Anne Newton   DOB:  03-Feb-1990   MRN:  [id number]  PCP:  Foye Spurling, MD    Chief Complaint: Contraception and Depression   History of Present Illness:  Anne Newton is a 25 y.o. very pleasant female patient who presents with the following:  She is here today seeking contraception- she would only like the nexplanon   We had asked her our depression screening questions in November and again 12/27- she was negative at that time.  However today she answered positively- states that she has been having trouble with work so this has caused her to feel down.  She feels like her supervisors are mean to her and she is looking for a new job.  She feels like her mother is not supporting her as she goes through this time.  This causes her to feel upset- however she does not feel it is serious and states that there is no risk of suicide or other self harm She is not interested in OCP or other method.  Explained that the case price of nexplanon is high- suggested [facility] and she will do so   Patient Active Problem List   Diagnosis Date Noted  . Hepatitis B immune 12/15/2014  . Thyroid cancer ([place]) 12/16/2013  . Hypothyroidism 12/16/2013  . Unspecified asthma(493.90) 07/03/2012  . Enlargement of lymph nodes 07/03/2012    Past Medical History  Diagnosis Date  . Hypothyroidism   . History of thyroid cancer 2014  . Nasal septal deviation 06/2014  . Nasal turbinate hypertrophy 06/2014  . Asthma     prn inhaler    Past Surgical History  Procedure Laterality Date  . Thyroidectomy Left 08/28/2012    Procedure: LEFT THYROID LOBECTOMY;  Surgeon: Rozetta Nunnery, MD;  Location: [place];  Service: ENT;  Laterality: Left;  . Thyroidectomy Right 10/21/2012    Procedure: COMPLETION THYROIDECTOMY (RIGHT THYROID LOBECTOMY);  Surgeon: Rozetta Nunnery, MD;   Location: [place];  Service: ENT;  Laterality: Right;  . Septoplasty N/A 06/30/2014    Procedure: SEPTOPLASTY;  Surgeon: Rozetta Nunnery, MD;  Location: [place];  Service: ENT;  Laterality: N/A;  . Turbinate reduction N/A 06/30/2014    Procedure: TURBINATE REDUCTION;  Surgeon: Rozetta Nunnery, MD;  Location: [place];  Service: ENT;  Laterality: N/A;    Social History  Substance Use Topics  . Smoking status: Never Smoker   . Smokeless tobacco: Never Used  . Alcohol Use: No    History reviewed. No pertinent family history.  Allergies  Allergen Reactions  . Chicken Allergy Shortness Of Breath    EXACERBATES ASTHMA    Medication list has been reviewed and updated.  Current Outpatient Prescriptions on File Prior to Visit  Medication Sig Dispense Refill  . levothyroxine (SYNTHROID, LEVOTHROID) 75 MCG tablet Take 75 mcg by mouth daily before breakfast.    . albuterol (PROVENTIL HFA;VENTOLIN HFA) 108 (90 BASE) MCG/ACT inhaler Inhale 2 puffs into the lungs every 6 (six) hours as needed for wheezing. (Patient not taking: Reported on 01/25/2015) 1 Inhaler 1  . clotrimazole-betamethasone (LOTRISONE) cream Apply 1 application topically daily. (Patient not taking: Reported on 01/25/2015) 15 g 0  . fluconazole (DIFLUCAN) 150 MG tablet Take 1 tablet (150 mg total) by mouth once. Repeat if needed (Patient not taking:  Reported on 01/25/2015) 2 tablet 0  . hydrOXYzine (ATARAX/VISTARIL) 25 MG tablet Take 0.5-1 tablets (12.5-25 mg total) by mouth every 8 (eight) hours as needed for itching. (Patient not taking: Reported on 01/25/2015) 30 tablet 0   No current facility-administered medications on file prior to visit.    Review of Systems:  As per HPI- otherwise negative.   Physical Examination: Filed Vitals:   01/25/15 1655  BP: 108/72  Pulse: 71  Temp: 98.1 F (36.7 C)  Resp: 14   Filed Vitals:   01/25/15 1655  Height: 5\' 1"  (1.549 m)  Weight: 120 lb  9.6 oz (54.704 kg)   Body mass index is 22.8 kg/(m^2). Ideal Body Weight: Weight in (lb) to have BMI = 25: 132   GEN: WDWN, NAD, Non-toxic, Alert & Oriented x 3 HEENT: Atraumatic, Normocephalic.  Ears and Nose: No external deformity. EXTR: No clubbing/cyanosis/edema NEURO: Normal gait.  PSYCH: Normally interactive. Conversant. Not depressed or anxious appearing.  Calm demeanor.    Assessment and Plan: Adjustment disorder with depressed mood  Encounter for other general counseling or advice on contraception  As above- she is having trouble at work but denies any serious depression- she will let me know if not better soon Encouraged her to contact PP as she is only interested in nexplanon- she will do so   Signed Lamar Blinks, MD

## 2015-01-25 NOTE — Patient Instructions (Signed)
I hope that things work out as far as your job soon!  If you do not feel back to normal soon please let me know If you are interested in the nexplanon birth control (implant in your arm) I would suggest that you contact [facility]- this is a clinic that can help you get this method for less money  [place],  [address]  p: [phone] In the meantime be sure to use condoms all the time so you do not become pregnant!

## 2015-02-04 MED ORDER — METRONIDAZOLE 500 MG PO TABS: 500.0000 mg | ORAL_TABLET | Freq: Two times a day (BID) | ORAL | Status: DC

## 2015-10-27 ENCOUNTER — Ambulatory Visit: Payer: BLUE CROSS/BLUE SHIELD

## 2015-10-27 DIAGNOSIS — Z23 Encounter for immunization: Secondary | ICD-10-CM

## 2015-11-15 ENCOUNTER — Ambulatory Visit (INDEPENDENT_AMBULATORY_CARE_PROVIDER_SITE_OTHER): Payer: BLUE CROSS/BLUE SHIELD | Admitting: Family Medicine

## 2015-11-15 ENCOUNTER — Encounter: Payer: Self-pay | Admitting: Family Medicine

## 2015-11-15 VITALS — BP 110/60 | HR 81 | Temp 98.9°F | Resp 16 | Ht 61.0 in | Wt 118.4 lb

## 2015-11-15 DIAGNOSIS — Z01419 Encounter for gynecological examination (general) (routine) without abnormal findings: Secondary | ICD-10-CM

## 2015-11-15 DIAGNOSIS — Z85850 Personal history of malignant neoplasm of thyroid: Secondary | ICD-10-CM

## 2015-11-15 DIAGNOSIS — Z113 Encounter for screening for infections with a predominantly sexual mode of transmission: Secondary | ICD-10-CM

## 2015-11-15 DIAGNOSIS — R5383 Other fatigue: Secondary | ICD-10-CM

## 2015-11-15 DIAGNOSIS — Z13220 Encounter for screening for lipoid disorders: Secondary | ICD-10-CM

## 2015-11-15 DIAGNOSIS — F419 Anxiety disorder, unspecified: Secondary | ICD-10-CM

## 2015-11-15 DIAGNOSIS — Z0000 Encounter for general adult medical examination without abnormal findings: Secondary | ICD-10-CM

## 2015-11-15 LAB — POCT WET + KOH PREP
Trich by wet prep: ABSENT
Yeast by KOH: ABSENT
Yeast by wet prep: ABSENT

## 2015-11-15 NOTE — Patient Instructions (Addendum)
Anxiety: I have referred you to behavioral health and they will contact you to schedule an appointment.  Thyroid: Continue your synthroid as prescribed.  Labs: I will contact you regarding lab results.  Anne Newton. Kenton Kingfisher, MSN, FNP-C [facility]  IF you received an x-ray today, you will receive an invoice from [facility]. Please contact [facility] at [phone] with questions or concerns regarding your invoice.   IF you received labwork today, you will receive an invoice from [employer]. Please contact [facility] at [phone] with questions or concerns regarding your invoice.   Our billing staff will not be able to assist you with questions regarding bills from these companies.  You will be contacted with the lab results as soon as they are available. The fastest way to get your results is to activate your My Chart account. Instructions are located on the last page of this paperwork. If you have not heard from [place] regarding the results in 2 weeks, please contact this office.     Exercising to Stay Healthy Exercising regularly is important. It has many health benefits, such as:  Improving your overall fitness, flexibility, and endurance.  Increasing your bone density.  Helping with weight control.  Decreasing your body fat.  Increasing your muscle strength.  Reducing stress and tension.  Improving your overall health. In order to become healthy and stay healthy, it is recommended that you do moderate-intensity and vigorous-intensity exercise. You can tell that you are exercising at a moderate intensity if you have a higher heart rate and faster breathing, but you are still able to hold a conversation. You can tell that you are exercising at a vigorous intensity if you are breathing much harder and faster and cannot hold a conversation while exercising. HOW OFTEN SHOULD I  EXERCISE? Choose an activity that you enjoy and set realistic goals. Your health care provider can help you to make an activity plan that works for you. Exercise regularly as directed by your health care provider. This may include:   Doing resistance training twice each week, such as:  Push-ups.  Sit-ups.  Lifting weights.  Using resistance bands.  Doing a given intensity of exercise for a given amount of time. Choose from these options:  150 minutes of moderate-intensity exercise every week.  75 minutes of vigorous-intensity exercise every week.  A mix of moderate-intensity and vigorous-intensity exercise every week. Children, pregnant women, people who are out of shape, people who are overweight, and older adults may need to consult a health care provider for individual recommendations. If you have any sort of medical condition, be sure to consult your health care provider before starting a new exercise program.  WHAT ARE SOME EXERCISE IDEAS? Some moderate-intensity exercise ideas include:   Walking at a rate of 1 mile in 15 minutes.  Biking.  Hiking.  Golfing.  Dancing. Some vigorous-intensity exercise ideas include:   Walking at a rate of at least 4.5 miles per hour.  Jogging or running at a rate of 5 miles per hour.  Biking at a rate of at least 10 miles per hour.  Lap swimming.  Roller-skating or in-line skating.  Cross-country skiing.  Vigorous competitive sports, such as football, basketball, and soccer.  Jumping rope.  Aerobic dancing. WHAT ARE SOME EVERYDAY ACTIVITIES THAT CAN HELP ME TO GET EXERCISE?  Yard work, such as:  Psychologist, educational.  Raking and bagging leaves.  Washing and waxing your  car.  Pushing a stroller.  Shoveling snow.  Gardening.  Washing windows or floors. HOW CAN I BE MORE ACTIVE IN MY DAY-TO-DAY ACTIVITIES?  Use the stairs instead of the elevator.  Take a walk during your lunch break.  If you drive, park your  car farther away from work or school.  If you take public transportation, get off one stop early and walk the rest of the way.  Make all of your phone calls while standing up and walking around.  Get up, stretch, and walk around every 30 minutes throughout the day. WHAT GUIDELINES SHOULD I FOLLOW WHILE EXERCISING?  Do not exercise so much that you hurt yourself, feel dizzy, or get very short of breath.  Consult your health care provider before starting a new exercise program.  Wear comfortable clothes and shoes with good support.  Drink plenty of water while you exercise to prevent dehydration or heat stroke. Body water is lost during exercise and must be replaced.  Work out until you breathe faster and your heart beats faster.   This information is not intended to replace advice given to you by your health care provider. Make sure you discuss any questions you have with your health care provider.   Document Released: 02/11/2010 Document Revised: 01/30/2014 Document Reviewed: 06/12/2013 Elsevier Interactive Patient Education 2016 Elsevier Inc.      Generalized Anxiety Disorder Generalized anxiety disorder (GAD) is a mental disorder. It interferes with life functions, including relationships, work, and school. GAD is different from normal anxiety, which everyone experiences at some point in their lives in response to specific life events and activities. Normal anxiety actually helps [place] prepare for and get through these life events and activities. Normal anxiety goes away after the event or activity is over.  GAD causes anxiety that is not necessarily related to specific events or activities. It also causes excess anxiety in proportion to specific events or activities. The anxiety associated with GAD is also difficult to control. GAD can vary from mild to severe. People with severe GAD can have intense waves of anxiety with physical symptoms (panic attacks).  SYMPTOMS The anxiety and  worry associated with GAD are difficult to control. This anxiety and worry are related to many life events and activities and also occur more days than not for 6 months or longer. People with GAD also have three or more of the following symptoms (one or more in children):  Restlessness.   Fatigue.  Difficulty concentrating.   Irritability.  Muscle tension.  Difficulty sleeping or unsatisfying sleep. DIAGNOSIS GAD is diagnosed through an assessment by your health care provider. Your health care provider will ask you questions aboutyour mood,physical symptoms, and events in your life. Your health care provider may ask you about your medical history and use of alcohol or drugs, including prescription medicines. Your health care provider may also do a physical exam and blood tests. Certain medical conditions and the use of certain substances can cause symptoms similar to those associated with GAD. Your health care provider may refer you to a mental health specialist for further evaluation. TREATMENT The following therapies are usually used to treat GAD:   Medication. Antidepressant medication usually is prescribed for long-term daily control. Antianxiety medicines may be added in severe cases, especially when panic attacks occur.   Talk therapy (psychotherapy). Certain types of talk therapy can be helpful in treating GAD by providing support, education, and guidance. A form of talk therapy called cognitive behavioral therapy can teach  you healthy ways to think about and react to daily life events and activities.  Stress managementtechniques. These include yoga, meditation, and exercise and can be very helpful when they are practiced regularly. A mental health specialist can help determine which treatment is best for you. Some people see improvement with one therapy. However, other people require a combination of therapies.   This information is not intended to replace advice given to you  by your health care provider. Make sure you discuss any questions you have with your health care provider.   Document Released: 05/06/2012 Document Revised: 01/30/2014 Document Reviewed: 05/06/2012 Elsevier Interactive Patient Education [employer].

## 2015-11-15 NOTE — Progress Notes (Signed)
Patient ID: Anne Newton, female    DOB: [birth year], 25 y.o.   MRN: [id number]  PCP: Foye Spurling, MD  Chief Complaint  Patient presents with  . Annual Exam    with PAP     Subjective:   HPI 25 year old female, presents for a complete physical exam and gynecological exam. Chronic problems include a history of  thyroid cancer and anxiety. She is followed by endocrinologist , Dr. Jaci Standard in which she reports seeing about 3 months ago. She is taking Synthroid 75 mcg daily and is uncertain of the value of her last TSH. She has never been professionally evaluated or treated for anxiety but reports multiple episodes weekly of feeling very anxious, stressed, and overwhelmed.     HEENT No visual changes. No hearing problems. Swollen thyroid,   Cardiovascular/Lungs Denies chest pain or shortness of breath  Breasts No routine self breast exams  Abdominal No abdominal   Genitourinary  No burning or frequency with urination No birth control and no desire to prevent pregnancy   Musculoskeletal   Neurological  Headache for last couple days  Psychological Health    Review of Systems  Constitutional: Negative.   HENT: Negative.  Negative for facial swelling, sore throat and trouble swallowing.   Eyes: Negative.   Respiratory: Negative.   Cardiovascular: Negative.   Gastrointestinal: Negative.   Endocrine: Negative.   Genitourinary: Negative for vaginal bleeding, vaginal discharge and vaginal pain.       1 sexual partner. Unprotected intercourse. Requests STD testing.  Musculoskeletal: Negative.   Skin: Negative.   Allergic/Immunologic: Negative.   Neurological: Negative.   Hematological: Negative.   Psychiatric/Behavioral: Positive for decreased concentration. Negative for behavioral problems, confusion, hallucinations, self-injury and suicidal ideas. The patient is nervous/anxious.        Reports that she has never been treated with medication or received counseling  previously. Stressed due to moving to new house. Worrying. New relationship  About 27 months old. Reports healthy relationship. Feels safe in relationship.    Patient Active Problem List   Diagnosis Date Noted  . Hepatitis B immune 12/15/2014  . Thyroid cancer ([place]) 12/16/2013  . Hypothyroidism 12/16/2013  . Unspecified asthma(493.90) 07/03/2012  . Enlargement of lymph nodes 07/03/2012     Prior to Admission medications   Medication Sig Start Date End Date Taking? Authorizing Provider  albuterol (PROVENTIL HFA;VENTOLIN HFA) 108 (90 BASE) MCG/ACT inhaler Inhale 2 puffs into the lungs every 6 (six) hours as needed for wheezing. 09/28/14  Yes Tishira R Brewington, PA-C  levothyroxine (SYNTHROID, LEVOTHROID) 75 MCG tablet Take 75 mcg by mouth daily before breakfast.   Yes Historical Provider, MD     Allergies  Allergen Reactions  . Chicken Allergy Shortness Of Breath    EXACERBATES ASTHMA       Objective:  Physical Exam  Constitutional: She is oriented to person, place, and time. She appears well-developed and well-nourished.  HENT:  Head: Normocephalic and atraumatic.  Right Ear: External ear normal.  Left Ear: External ear normal.  Nose: Nose normal.  Mouth/Throat: Oropharynx is clear and moist.  Neck: Normal range of motion. Neck supple. No thyromegaly present.  Cardiovascular: Normal rate, regular rhythm, normal heart sounds and intact distal pulses.   Pulmonary/Chest: Effort normal and breath sounds normal.  Abdominal: Soft. Bowel sounds are normal. She exhibits no mass. There is no rebound and no guarding.  Genitourinary: Vaginal discharge found.  Genitourinary Comments: Breasts are symmetric without cutaneous changes, nipple inversion  or discharge. No masses or tenderness, and no axillary lymphadenopathy.  Normal female external genitalia without lesion. No inguinal lymphadenopathy. Vaginal mucosa is pink and moist without lesions. Cervix is closed with thickened clear  discharge, not friable. No cervical motion tenderness, adnexal fullness or tenderness.  Pap smear obtained and Wet prep specimen obtained.  Musculoskeletal: Normal range of motion.  Lymphadenopathy:    She has no cervical adenopathy.  Neurological: She is alert and oriented to person, place, and time. She has normal reflexes.  Skin: Skin is warm and dry.  Vitals reviewed.  Vitals:   11/15/15 1700  BP: 110/60  Pulse: 81  Resp: 16  Temp: 98.9 F (37.2 C)    Assessment & Plan:  1. Annual physical exam Age-appropriate anticipatory guidance provided.  2. Anxiety - Ambulatory referral to Behavioral Health  3. Encounter for annual routine gynecological examination - POCT Wet + KOH Prep - Pap IG and Chlamydia/Gonococcus, NAA  4. Screening for STD (sexually transmitted disease) - HIV antibody (with reflex) - RPR  5. Screening cholesterol level - Lipid panel  6. Hx of thyroid cancer - Thyroid Panel With TSH -Continue Levothyroxine 75 mcg daily -Continue follow-up with endocrinologist Dr. Jaci Standard  7. Other fatigue - COMPLETE METABOLIC PANEL WITH GFR - CBC with Differential/Platelet   Carroll Sage. Kenton Kingfisher, MSN, FNP-C [facility]

## 2015-11-16 LAB — THYROID PANEL WITH TSH
FREE THYROXINE INDEX: 3.3 (ref 1.4–3.8)
T3 UPTAKE: 29 % (ref 22–35)
T4 TOTAL: 11.5 ug/dL (ref 4.5–12.0)
TSH: 0.41 mIU/L

## 2015-11-16 LAB — CBC WITH DIFFERENTIAL/PLATELET
BASOS PCT: 0 %
Basophils Absolute: 0 cells/uL (ref 0–200)
EOS ABS: 126 {cells}/uL (ref 15–500)
EOS PCT: 2 %
HCT: 41.2 % (ref 35.0–45.0)
Hemoglobin: 13.5 g/dL (ref 11.7–15.5)
Lymphocytes Relative: 26 %
Lymphs Abs: 1638 cells/uL (ref 850–3900)
MCH: 27.9 pg (ref 27.0–33.0)
MCHC: 32.8 g/dL (ref 32.0–36.0)
MCV: 85.1 fL (ref 80.0–100.0)
MONOS PCT: 10 %
MPV: 9.3 fL (ref 7.5–12.5)
Monocytes Absolute: 630 cells/uL (ref 200–950)
NEUTROS ABS: 3906 {cells}/uL (ref 1500–7800)
Neutrophils Relative %: 62 %
PLATELETS: 259 10*3/uL (ref 140–400)
RBC: 4.84 MIL/uL (ref 3.80–5.10)
RDW: 12.9 % (ref 11.0–15.0)
WBC: 6.3 10*3/uL (ref 3.8–10.8)

## 2015-11-16 LAB — COMPLETE METABOLIC PANEL WITH GFR
ALT: 32 U/L — ABNORMAL HIGH (ref 6–29)
AST: 24 U/L (ref 10–30)
Albumin: 4.6 g/dL (ref 3.6–5.1)
Alkaline Phosphatase: 51 U/L (ref 33–115)
BILIRUBIN TOTAL: 1 mg/dL (ref 0.2–1.2)
BUN: 10 mg/dL (ref 7–25)
CO2: 24 mmol/L (ref 20–31)
Calcium: 9.4 mg/dL (ref 8.6–10.2)
Chloride: 105 mmol/L (ref 98–110)
Creat: 0.49 mg/dL — ABNORMAL LOW (ref 0.50–1.10)
Glucose, Bld: 83 mg/dL (ref 65–99)
Potassium: 4.1 mmol/L (ref 3.5–5.3)
Sodium: 140 mmol/L (ref 135–146)
TOTAL PROTEIN: 7.5 g/dL (ref 6.1–8.1)

## 2015-11-16 LAB — LIPID PANEL
Cholesterol: 153 mg/dL (ref 125–200)
HDL: 34 mg/dL — ABNORMAL LOW (ref >=46)
LDL CALC: 106 mg/dL (ref <130)
Total CHOL/HDL Ratio: 4.5 Ratio (ref <=5.0)
Triglycerides: 66 mg/dL (ref <150)
VLDL: 13 mg/dL (ref <30)

## 2015-11-16 LAB — HIV ANTIBODY (ROUTINE TESTING W REFLEX): HIV: NONREACTIVE

## 2015-11-17 ENCOUNTER — Encounter: Payer: Self-pay | Admitting: Family Medicine

## 2015-11-17 LAB — PAP IG AND CT-NG NAA
Chlamydia Probe Amp: NOT DETECTED
GC Probe Amp: NOT DETECTED

## 2015-11-17 LAB — RPR

## 2015-11-17 NOTE — Progress Notes (Signed)
November 17, 2015   Anne Newton [address] [place] [address]   Dear Ms. Coote,  Below are the results from your recent visit were as expected.  Resulted Orders  Pap IG and Chlamydia/Gonococcus, NAA  Result Value Ref Range   Specimen adequacy: SEE NOTE      Comment:     SATISFACTORY.  Endocervical/transformation zone component present.   FINAL DIAGNOSIS: SEE NOTE      Comment:     - NEGATIVE FOR INTRAEPITHELIAL LESIONS OR MALIGNANCY.    COMMENTS: SEE NOTE      Comment:     This Pap test has been evaluated with computer assisted technology.   Cytotechnologist: SEE NOTE      Comment:     JRW, BS CT(ASCP)   Chlamydia Probe Amp NOT DETECTED      Comment:                        **Normal Reference Range: NOT DETECTED**   This test was performed using the APTIMA COMBO2 Assay ([place].).   The analytical performance characteristics of this assay, when used to test SurePath specimens have been determined by [facility]      GC Probe Amp NOT DETECTED      Comment:                        **Normal Reference Range: NOT DETECTED**   This test was performed using the APTIMA COMBO2 Assay ([place].).   The analytical performance characteristics of this assay, when used to test SurePath specimens have been determined by [facility]   *  The Pap is a screening test for cervical cancer. It is not a  diagnostic test and is subject to false negative and false positive  results. It is most reliable when a satisfactory sample, regularly  obtained, is submitted with relevant clinical findings and history,  and when the Pap result is evaluated along with historic and current  clinical information.    Narrative   Performed at:  [facility]                [address]                [place], [place] [address]  COMPLETE METABOLIC PANEL WITH GFR  Result Value Ref Range   Sodium 140 135 - 146 mmol/L   Potassium 4.1 3.5 -  5.3 mmol/L   Chloride 105 98 - 110 mmol/L   CO2 24 20 - 31 mmol/L   Glucose, Bld 83 65 - 99 mg/dL   BUN 10 7 - 25 mg/dL   Creat 0.49 (L) 0.50 - 1.10 mg/dL   Total Bilirubin 1.0 0.2 - 1.2 mg/dL   Alkaline Phosphatase 51 33 - 115 U/L   AST 24 10 - 30 U/L   ALT 32 (H) 6 - 29 U/L   Total Protein 7.5 6.1 - 8.1 g/dL   Albumin 4.6 3.6 - 5.1 g/dL   Calcium 9.4 8.6 - 10.2 mg/dL   GFR, Est African American >89 >=60 mL/min   GFR, Est Non African American >89 >=60 mL/min   Narrative   Performed at:  [place], [address]  [place], [place] [address]  CBC with Differential/Platelet  Result Value Ref Range   WBC 6.3 3.8 - 10.8 K/uL   RBC 4.84 3.80 - 5.10 MIL/uL   Hemoglobin 13.5 11.7 - 15.5 g/dL   HCT 41.2 35.0 - 45.0 %   MCV 85.1 80.0 - 100.0 fL   MCH 27.9 27.0 - 33.0 pg   MCHC 32.8 32.0 - 36.0 g/dL   RDW 12.9 11.0 - 15.0 %   Platelets 259 140 - 400 K/uL   MPV 9.3 7.5 - 12.5 fL   Neutro Abs 3,906 1,500 - 7,800 cells/uL   Lymphs Abs 1,638 850 - 3,900 cells/uL   Monocytes Absolute 630 200 - 950 cells/uL   Eosinophils Absolute 126 15 - 500 cells/uL   Basophils Absolute 0 0 - 200 cells/uL   Neutrophils Relative % 62 %   Lymphocytes Relative 26 %   Monocytes Relative 10 %   Eosinophils Relative 2 %   Basophils Relative 0 %   Smear Review Criteria for review not met    Narrative   Performed at:  [address], [address]                [place], [place] [address]  HIV antibody (with reflex)  Result Value Ref Range   HIV 1&2 Ab, 4th Generation NONREACTIVE NONREACTIVE     Comment:       HIV-1 antigen and HIV-1/HIV-2 antibodies were not detected.  There is no laboratory evidence of HIV infection.   HIV-1/2 Antibody Diff        Not indicated. HIV-1 RNA, Qual TMA          Not indicated.     PLEASE NOTE: This information has been disclosed to you from records whose confidentiality may be protected by  state law. If your state requires such protection, then the state law prohibits you from making any further disclosure of the information without the specific written consent of the person to whom it pertains, or as otherwise permitted by law. A general authorization for the release of medical or other information is NOT sufficient for this purpose.   The performance of this assay has not been clinically validated in patients less than 56 years old.   For additional information please refer to http://education.[facility].com/faq/FAQ106.  (This link is being provided for informational/educational purposes only.)      Narrative   Performed at:  [facility] [address], [address]                [place],  [address]  Thyroid Panel With TSH  Result Value Ref Range   T4, Total 11.5 4.5 - 12.0 ug/dL   T3 Uptake 29 22 - 35 %   Free Thyroxine Index 3.3 1.4 - 3.8   TSH 0.41 mIU/L     Comment:       Reference Range   > or = 20 Years  0.40-4.50   Pregnancy Range First trimester  0.26-2.66 Second trimester 0.55-2.73 Third trimester  0.43-2.91      Narrative   Performed at:  [place] [place], [address]  [place], [place] [address]  RPR  Result Value Ref Range   RPR Ser Ql NON REAC NON REAC   Narrative   Performed at:  [address], [address]                [place], [place] [address]  Lipid panel  Result Value Ref Range   Cholesterol 153 125 - 200 mg/dL   Triglycerides 66 <150 mg/dL   HDL 34 (L) >=46 mg/dL   Total CHOL/HDL Ratio 4.5 <=5.0 Ratio   VLDL 13 <30 mg/dL   LDL Cholesterol 106 <130 mg/dL     Comment:       Total Cholesterol/HDL Ratio:CHD Risk                        Coronary Heart Disease Risk Table                                        Men       Women          1/2 Average Risk              3.4        3.3              Average Risk               5.0        4.4           2X Average Risk              9.6        7.1           3X Average Risk             23.4       11.0 Use the calculated Patient Ratio above and the CHD Risk table  to determine the patient's CHD Risk.    Narrative   Performed at:  [place], [address]                [place], [place] [address]     If you have any questions or concerns, please don't hesitate to call.  Sincerely,   Carroll Sage. Kenton Kingfisher, MSN, FNP-C [facility]

## 2015-12-27 ENCOUNTER — Ambulatory Visit: Payer: Self-pay | Admitting: Otolaryngology

## 2015-12-27 NOTE — H&P (Signed)
PREOPERATIVE H&P  Chief Complaint: trouble breathing through the ride side of my nose  HPI: Anne Newton is a 25 y.o. female who presents for evaluation of right sided nasal obstruction. She had previous nasal surgery 5/16. She did well for a while but has noticed increased difficulty breathing through her right side. She's taken to the OR for revision septoplasty and turbinate reduction.  Past Medical History:  Diagnosis Date  . Asthma    prn inhaler  . History of thyroid cancer 2014  . Hypothyroidism   . Nasal septal deviation 06/2014  . Nasal turbinate hypertrophy 06/2014   Past Surgical History:  Procedure Laterality Date  . SEPTOPLASTY N/A 06/30/2014   Procedure: SEPTOPLASTY;  Surgeon: Rozetta Nunnery, MD;  Location: [facility];  Service: ENT;  Laterality: N/A;  . THYROIDECTOMY Left 08/28/2012   Procedure: LEFT THYROID LOBECTOMY;  Surgeon: Rozetta Nunnery, MD;  Location: [facility];  Service: ENT;  Laterality: Left;  . THYROIDECTOMY Right 10/21/2012   Procedure: COMPLETION THYROIDECTOMY (RIGHT THYROID LOBECTOMY);  Surgeon: Rozetta Nunnery, MD;  Location: [facility];  Service: ENT;  Laterality: Right;  . TURBINATE REDUCTION N/A 06/30/2014   Procedure: [place] REDUCTION;  Surgeon: Rozetta Nunnery, MD;  Location: [facility];  Service: ENT;  Laterality: N/A;   Social History   Social History  . Marital status: Married    Spouse name: N/A  . Number of children: N/A  . Years of education: N/A   Social History Main Topics  . Smoking status: Never Smoker  . Smokeless tobacco: Never Used  . Alcohol use No  . Drug use: No  . Sexual activity: Yes    Birth control/ protection: None   Other Topics Concern  . Not on file   Social History Narrative  . No narrative on file   No family history on file. Allergies  Allergen Reactions  . Chicken Allergy Shortness Of Breath    EXACERBATES ASTHMA   Prior to Admission medications   Medication Sig  Start Date End Date Taking? Authorizing Provider  albuterol (PROVENTIL HFA;VENTOLIN HFA) 108 (90 BASE) MCG/ACT inhaler Inhale 2 puffs into the lungs every 6 (six) hours as needed for wheezing. 09/28/14   Tishira R Brewington, PA-C  levothyroxine (SYNTHROID, LEVOTHROID) 75 MCG tablet Take 75 mcg by mouth daily before breakfast.    Historical Provider, MD     Positive ROS: trouble breathing per HPI  All other systems have been reviewed and were otherwise negative with the exception of those mentioned in the HPI and as above.  Physical Exam: There were no vitals filed for this visit.  General: Alert, no acute distress Oral: Normal oral mucosa and tonsils Nasal: Septal deviation to the right with moderate size turbinates. Neck: No palpable adenopathy or thyroid nodules Ear: Ear canal is clear with normal appearing TMs Cardiovascular: Regular rate and rhythm, no murmur.  Respiratory: Clear to auscultation Neurologic: Alert and oriented x 3   Assessment/Plan: SEPTAL DEVIATION, TURBINATE HYPERTROPHY Plan for Procedure(s): NASAL SEPTOPLASTY WITH BILATERAL TURBINATE REDUCTION   Melony Overly, MD 12/27/2015 4:14 PM

## 2016-01-03 ENCOUNTER — Encounter (HOSPITAL_BASED_OUTPATIENT_CLINIC_OR_DEPARTMENT_OTHER): Payer: Self-pay | Admitting: *Deleted

## 2016-01-04 ENCOUNTER — Ambulatory Visit (HOSPITAL_BASED_OUTPATIENT_CLINIC_OR_DEPARTMENT_OTHER): Admission: RE | Admit: 2016-01-04 | Payer: BLUE CROSS/BLUE SHIELD | Source: Ambulatory Visit | Admitting: Otolaryngology

## 2016-01-04 SURGERY — SEPTOPLASTY, NOSE, WITH NASAL TURBINATE REDUCTION
Anesthesia: General | Laterality: Bilateral

## 2017-01-11 ENCOUNTER — Ambulatory Visit: Payer: Self-pay | Admitting: Physician Assistant

## 2017-01-29 ENCOUNTER — Other Ambulatory Visit: Payer: Self-pay

## 2017-01-29 ENCOUNTER — Ambulatory Visit (INDEPENDENT_AMBULATORY_CARE_PROVIDER_SITE_OTHER): Payer: BLUE CROSS/BLUE SHIELD | Admitting: Physician Assistant

## 2017-01-29 ENCOUNTER — Encounter: Payer: Self-pay | Admitting: Physician Assistant

## 2017-01-29 VITALS — BP 112/68 | HR 98 | Temp 98.6°F | Resp 16 | Ht 62.0 in | Wt 117.6 lb

## 2017-01-29 DIAGNOSIS — Z1329 Encounter for screening for other suspected endocrine disorder: Secondary | ICD-10-CM

## 2017-01-29 DIAGNOSIS — Z13228 Encounter for screening for other metabolic disorders: Secondary | ICD-10-CM

## 2017-01-29 DIAGNOSIS — Z0000 Encounter for general adult medical examination without abnormal findings: Secondary | ICD-10-CM

## 2017-01-29 DIAGNOSIS — Z13220 Encounter for screening for lipoid disorders: Secondary | ICD-10-CM

## 2017-01-29 DIAGNOSIS — Z113 Encounter for screening for infections with a predominantly sexual mode of transmission: Secondary | ICD-10-CM | POA: Diagnosis not present

## 2017-01-29 DIAGNOSIS — Z130 Encounter for screening for diseases of the blood and blood-forming organs and certain disorders involving the immune mechanism: Secondary | ICD-10-CM | POA: Diagnosis not present

## 2017-01-29 DIAGNOSIS — Z124 Encounter for screening for malignant neoplasm of cervix: Secondary | ICD-10-CM

## 2017-01-29 DIAGNOSIS — Z23 Encounter for immunization: Secondary | ICD-10-CM | POA: Diagnosis not present

## 2017-01-29 NOTE — Progress Notes (Signed)
PRIMARY CARE AT [facility] [address] [place] [address] [phone]  Date:  01/29/2017   Name:  Anne Newton   DOB:  Apr 08, 1990   MRN:  [id number]  PCP:  Foye Spurling, MD    History of Present Illness:  Anne Newton is a 27 y.o. female patient who presents to PCP with cc of annual physical exam.  DIET: noodle soup.  Rice fish, vegetables.  No chicken.  Water: 32oz.  Not a daily content of caffeine  BM: NORMAL.  No constipation or diarrhea.  No blood or black stool  URINATION: No dysuria, hematuria, no frequency  SLEEP: 8 HOURS.  SOCIAL ACTIVITY: hang out with friends.   EtOH: 3-4 beer per week. Tobacco or vaping: none Illicit drug use: none  Sexually active multiple partners.  Both with condom and not.  She does not want to use a birth control pill because she wants her period.  She was sexually active 2 weeks ago with one partner, then another yesterday without condom, though this was not completed. lmp 01/06/2017 No hx of pregnancies. No history of blood clots. Hx chlamydia.   Patient Active Problem List   Diagnosis Date Noted  . Hepatitis B immune 12/15/2014  . Thyroid cancer ([place]) 12/16/2013  . Hypothyroidism 12/16/2013  . Unspecified asthma(493.90) 07/03/2012  . Enlargement of lymph nodes 07/03/2012    Past Medical History:  Diagnosis Date  . Asthma    prn inhaler  . Hypothyroidism   . Nasal septal deviation 12/2015  . Nasal turbinate hypertrophy 12/2015    Past Surgical History:  Procedure Laterality Date  . SEPTOPLASTY N/A 06/30/2014   Procedure: SEPTOPLASTY;  Surgeon: Rozetta Nunnery, MD;  Location: [facility];  Service: ENT;  Laterality: N/A;  . THYROIDECTOMY Left 08/28/2012   Procedure: LEFT THYROID LOBECTOMY;  Surgeon: Rozetta Nunnery, MD;  Location: [facility];  Service: ENT;  Laterality: Left;  . THYROIDECTOMY Right 10/21/2012   Procedure: COMPLETION THYROIDECTOMY (RIGHT THYROID LOBECTOMY);  Surgeon: Rozetta Nunnery, MD;   Location: [facility];  Service: ENT;  Laterality: Right;  . TURBINATE REDUCTION N/A 06/30/2014   Procedure: [place] REDUCTION;  Surgeon: Rozetta Nunnery, MD;  Location: [facility];  Service: ENT;  Laterality: N/A;    Social History   Tobacco Use  . Smoking status: Never Smoker  . Smokeless tobacco: Never Used  Substance Use Topics  . Alcohol use: No  . Drug use: No    History reviewed. No pertinent family history.  Allergies  Allergen Reactions  . Chicken Allergy Shortness Of Breath    EXACERBATES ASTHMA  . Shrimp [Shellfish Allergy] Shortness Of Breath    EXACERBATES ASTHMA  . Ibuprofen Swelling    Eye redness and swelling     Medication list has been reviewed and updated.  Current Outpatient Medications on File Prior to Visit  Medication Sig Dispense Refill  . albuterol (PROVENTIL HFA;VENTOLIN HFA) 108 (90 BASE) MCG/ACT inhaler Inhale 2 puffs into the lungs every 6 (six) hours as needed for wheezing. 1 Inhaler 1  . levothyroxine (SYNTHROID, LEVOTHROID) 88 MCG tablet Take 88 mcg by mouth daily before breakfast.     No current facility-administered medications on file prior to visit.     Review of Systems  Constitutional: Negative for chills and fever.  HENT: Negative for ear discharge, ear pain and sore throat.   Eyes: Negative for blurred vision and double vision.  Respiratory: Negative for cough, shortness of  breath and wheezing.   Cardiovascular: Negative for chest pain, palpitations and leg swelling.  Gastrointestinal: Negative for diarrhea, nausea and vomiting.  Genitourinary: Negative for dysuria, frequency and hematuria.  Skin: Negative for itching and rash.  Neurological: Negative for dizziness and headaches.   ROS otherwise unremarkable unless listed above.  Physical Examination: BP 112/68   Pulse 98   Temp 98.6 F (37 C)   Resp 16   Ht '5\' 2"'  (1.575 m)   Wt 117 lb 9.6 oz (53.3 kg)   SpO2 98%   BMI 21.51 kg/m  Ideal Body Weight:  Weight in (lb) to have BMI = 25: 136.4  Physical Exam  Constitutional: She is oriented to person, place, and time. She appears well-developed and well-nourished. No distress.  HENT:  Head: Normocephalic and atraumatic.  Right Ear: Tympanic membrane, external ear and ear canal normal.  Left Ear: Tympanic membrane, external ear and ear canal normal.  Nose: Right sinus exhibits no maxillary sinus tenderness and no frontal sinus tenderness. Left sinus exhibits no maxillary sinus tenderness and no frontal sinus tenderness.  Mouth/Throat: Oropharynx is clear and moist. No uvula swelling. No oropharyngeal exudate, posterior oropharyngeal edema or posterior oropharyngeal erythema.  Eyes: Conjunctivae and EOM are normal. Pupils are equal, round, and reactive to light.  Neck: Normal range of motion. Neck supple. No thyromegaly present.  Cardiovascular: Normal rate, regular rhythm, normal heart sounds and intact distal pulses. Exam reveals no gallop, no distant heart sounds and no friction rub.  No murmur heard. Pulmonary/Chest: Effort normal and breath sounds normal. No respiratory distress. She has no decreased breath sounds. She has no wheezes. She has no rhonchi.  Abdominal: Soft. Bowel sounds are normal. She exhibits no distension and no mass. There is no tenderness.  Genitourinary: Vagina normal and uterus normal. Pelvic exam was performed with patient supine. Cervix exhibits discharge. Right adnexum displays no mass. Left adnexum displays no mass.  Genitourinary Comments: Cervix inflammation possible  Musculoskeletal: Normal range of motion. She exhibits no edema or tenderness.  Lymphadenopathy:       Head (right side): No submandibular, no tonsillar, no preauricular and no posterior auricular adenopathy present.       Head (left side): No submandibular, no tonsillar, no preauricular and no posterior auricular adenopathy present.    She has no cervical adenopathy.  Neurological: She is alert and  oriented to person, place, and time. No cranial nerve deficit. She exhibits normal muscle tone. Coordination normal.  Skin: Skin is warm and dry. She is not diaphoretic.  Psychiatric: She has a normal mood and affect. Her behavior is normal.     Assessment and Plan: Anne Newton is a 27 y.o. female who is here today for cc of physical exam. OCP information given.  Discussed at length that this can continue period at right regimen, and them importance of protection and contraception to avoid pregnancy.   Annual physical exam - Plan: CBC, TSH, Lipid panel, CMP14+EGFR, HPV 9-valent vaccine,Recombinat  Screening for deficiency anemia - Plan: CBC  Screening for thyroid disorder - Plan: TSH  Screening for lipid disorders - Plan: Lipid panel  Screening for metabolic disorder - Plan: CMP14+EGFR  Screening for cervical cancer - Plan: Pap IG and Chlamydia/Gonococcus, NAA  Screening for STD (sexually transmitted disease) - Plan: HIV antibody, RPR  Need for HPV vaccination - Plan: HPV 9-valent vaccine,Recombinat  Ivar Drape, PA-C [facility] 1/7/20191:42 PM \

## 2017-01-29 NOTE — Patient Instructions (Addendum)
Oral Contraception Information Oral contraceptive pills (OCPs) are medicines taken to prevent pregnancy. OCPs work by preventing the ovaries from releasing eggs. The hormones in OCPs also cause the cervical mucus to thicken, preventing the sperm from entering the uterus. The hormones also cause the uterine lining to become thin, not allowing a fertilized egg to attach to the inside of the uterus. OCPs are highly effective when taken exactly as prescribed. However, OCPs do not prevent sexually transmitted diseases (STDs). Safe sex practices, such as using condoms along with the pill, can help prevent STDs. Before taking the pill, you may have a physical exam and Pap test. Your health care provider may order blood tests. The health care provider will make sure you are a good candidate for oral contraception. Discuss with your health care provider the possible side effects of the OCP you may be prescribed. When starting an OCP, it can take 2 to 3 months for the body to adjust to the changes in hormone levels in your body. Types of oral contraception  The combination pill-This pill contains estrogen and progestin (synthetic progesterone) hormones. The combination pill comes in 21-day, 28-day, or 91-day packs. Some types of combination pills are meant to be taken continuously (365-day pills). With 21-day packs, you do not take pills for 7 days after the last pill. With 28-day packs, the pill is taken every day. The last 7 pills are without hormones. Certain types of pills have more than 21 hormone-containing pills. With 91-day packs, the first 84 pills contain both hormones, and the last 7 pills contain no hormones or contain estrogen only.  The minipill-This pill contains the progesterone hormone only. The pill is taken every day continuously. It is very important to take the pill at the same time each day. The minipill comes in packs of 28 pills. All 28 pills contain the hormone. Advantages of oral  contraceptive pills  Decreases premenstrual symptoms.  Treats menstrual period cramps.  Regulates the menstrual cycle.  Decreases a heavy menstrual flow.  May treatacne, depending on the type of pill.  Treats abnormal uterine bleeding.  Treats polycystic ovarian syndrome.  Treats endometriosis.  Can be used as emergency contraception. Things that can make oral contraceptive pills less effective OCPs can be less effective if:  You forget to take the pill at the same time every day.  You have a stomach or intestinal disease that lessens the absorption of the pill.  You take OCPs with other medicines that make OCPs less effective, such as antibiotics, certain HIV medicines, and some seizure medicines.  You take expired OCPs.  You forget to restart the pill on day 7, when using the packs of 21 pills.  Risks associated with oral contraceptive pills Oral contraceptive pills can sometimes cause side effects, such as:  Headache.  Nausea.  Breast tenderness.  Irregular bleeding or spotting.  Combination pills are also associated with a small increased risk of:  Blood clots.  Heart attack.  Stroke.  This information is not intended to replace advice given to you by your health care provider. Make sure you discuss any questions you have with your health care provider. Document Released: 04/01/2002 Document Revised: 06/17/2015 Document Reviewed: 06/30/2012 Elsevier Interactive Patient Education  2018 Elsevier Inc.  Human Papillomavirus Quadrivalent Vaccine suspension for injection What is this medicine? HUMAN PAPILLOMAVIRUS VACCINE (HYOO muhn pap uh LOH muh vahy ruhs vak SEEN) is a vaccine. It is used to prevent infections of four types of the human papillomavirus. In  women, the vaccine may lower your risk of getting cervical, vaginal, vulvar, or anal cancer and genital warts. In men, the vaccine may lower your risk of getting genital warts and anal cancer. You cannot  get these diseases from the vaccine. This vaccine does not treat these diseases. This medicine may be used for other purposes; ask your health care provider or pharmacist if you have questions. COMMON BRAND NAME(S): Gardasil What should I tell my health care provider before I take this medicine? They need to know if you have any of these conditions: -fever or infection -hemophilia -HIV infection or AIDS -immune system problems -low platelet count -an unusual reaction to Human Papillomavirus Vaccine, yeast, other medicines, foods, dyes, or preservatives -pregnant or trying to get pregnant -breast-feeding How should I use this medicine? This vaccine is for injection in a muscle on your upper arm or thigh. It is given by a health care professional. Anne Newton will be observed for 15 minutes after each dose. Sometimes, fainting happens after the vaccine is given. You may be asked to sit or lie down during the 15 minutes. Three doses are given. The second dose is given 2 months after the first dose. The last dose is given 4 months after the second dose. A copy of a Vaccine Information Statement will be given before each vaccination. Read this sheet carefully each time. The sheet may change frequently. Talk to your pediatrician regarding the use of this medicine in children. While this drug may be prescribed for children as young as 71 years of age for selected conditions, precautions do apply. Overdosage: If you think you have taken too much of this medicine contact a poison control center or emergency room at once. NOTE: This medicine is only for you. Do not share this medicine with others. What if I miss a dose? All 3 doses of the vaccine should be given within 6 months. Remember to keep appointments for follow-up doses. Your health care provider will tell you when to return for the next vaccine. Ask your health care professional for advice if you are unable to keep an appointment or miss a scheduled  dose. What may interact with this medicine? -other vaccines This list may not describe all possible interactions. Give your health care provider a list of all the medicines, herbs, non-prescription drugs, or dietary supplements you use. Also tell them if you smoke, drink alcohol, or use illegal drugs. Some items may interact with your medicine. What should I watch for while using this medicine? This vaccine may not fully protect everyone. Continue to have regular pelvic exams and cervical or anal cancer screenings as directed by your doctor. The Human Papillomavirus is a sexually transmitted disease. It can be passed by any kind of sexual activity that involves genital contact. The vaccine works best when given before you have any contact with the virus. Many people who have the virus do not have any signs or symptoms. Tell your doctor or health care professional if you have any reaction or unusual symptom after getting the vaccine. What side effects may I notice from receiving this medicine? Side effects that you should report to your doctor or health care professional as soon as possible: -allergic reactions like skin rash, itching or hives, swelling of the face, lips, or tongue -breathing problems -feeling faint or lightheaded, falls Side effects that usually do not require medical attention (report to your doctor or health care professional if they continue or are bothersome): -cough -dizziness -fever -headache -  nausea -redness, warmth, swelling, pain, or itching at site where injected This list may not describe all possible side effects. Call your doctor for medical advice about side effects. You may report side effects to FDA at 1-800-FDA-1088. Where should I keep my medicine? This drug is given in a hospital or clinic and will not be stored at home. NOTE: This sheet is a summary. It may not cover all possible information. If you have questions about this medicine, talk to your doctor,  pharmacist, or health care provider.  2018 Elsevier/Gold Standard (2013-03-03 13:14:33)     IF you received an x-ray today, you will receive an invoice from [facility]. Please contact [facility] at [phone] with questions or concerns regarding your invoice.   IF you received labwork today, you will receive an invoice from [facility]. Please contact [facility] at [phone] with questions or concerns regarding your invoice.   Our billing staff will not be able to assist you with questions regarding bills from these companies.  You will be contacted with the lab results as soon as they are available. The fastest way to get your results is to activate your My Chart account. Instructions are located on the last page of this paperwork. If you have not heard from [place] regarding the results in 2 weeks, please contact this office.

## 2017-01-30 LAB — CMP14+EGFR
A/G RATIO: 1.6 (ref 1.2–2.2)
ALT: 15 IU/L (ref 0–32)
AST: 16 IU/L (ref 0–40)
Albumin: 5.1 g/dL (ref 3.5–5.5)
Alkaline Phosphatase: 59 IU/L (ref 39–117)
BUN/Creatinine Ratio: 13 (ref 9–23)
BUN: 10 mg/dL (ref 6–20)
Bilirubin Total: 1.3 mg/dL — ABNORMAL HIGH (ref 0.0–1.2)
CALCIUM: 10.1 mg/dL (ref 8.7–10.2)
CO2: 22 mmol/L (ref 20–29)
Chloride: 101 mmol/L (ref 96–106)
Creatinine, Ser: 0.79 mg/dL (ref 0.57–1.00)
GFR, EST AFRICAN AMERICAN: 120 mL/min/{1.73_m2} (ref >59)
GFR, EST NON AFRICAN AMERICAN: 104 mL/min/{1.73_m2} (ref >59)
GLOBULIN, TOTAL: 3.1 g/dL (ref 1.5–4.5)
Glucose: 90 mg/dL (ref 65–99)
POTASSIUM: 4 mmol/L (ref 3.5–5.2)
Sodium: 143 mmol/L (ref 134–144)
Total Protein: 8.2 g/dL (ref 6.0–8.5)

## 2017-01-30 LAB — CBC
Hematocrit: 42.6 % (ref 34.0–46.6)
Hemoglobin: 14.3 g/dL (ref 11.1–15.9)
MCH: 28.8 pg (ref 26.6–33.0)
MCHC: 33.6 g/dL (ref 31.5–35.7)
MCV: 86 fL (ref 79–97)
PLATELETS: 276 10*3/uL (ref 150–379)
RBC: 4.96 x10E6/uL (ref 3.77–5.28)
RDW: 13.1 % (ref 12.3–15.4)
WBC: 7.9 10*3/uL (ref 3.4–10.8)

## 2017-01-30 LAB — LIPID PANEL
Chol/HDL Ratio: 3.2 ratio (ref 0.0–4.4)
Cholesterol, Total: 168 mg/dL (ref 100–199)
HDL: 52 mg/dL (ref >39)
LDL Calculated: 98 mg/dL (ref 0–99)
Triglycerides: 90 mg/dL (ref 0–149)
VLDL Cholesterol Cal: 18 mg/dL (ref 5–40)

## 2017-01-30 LAB — HIV ANTIBODY (ROUTINE TESTING W REFLEX): HIV Screen 4th Generation wRfx: NONREACTIVE

## 2017-01-30 LAB — RPR: RPR Ser Ql: NONREACTIVE

## 2017-01-30 LAB — TSH: TSH: 2.62 u[IU]/mL (ref 0.450–4.500)

## 2017-01-31 LAB — PAP IG AND CT-NG NAA
CHLAMYDIA, NUC. ACID AMP: NEGATIVE
Gonococcus by Nucleic Acid Amp: NEGATIVE
PAP Smear Comment: 0

## 2017-02-09 ENCOUNTER — Telehealth: Payer: Self-pay | Admitting: Physician Assistant

## 2017-02-09 ENCOUNTER — Other Ambulatory Visit: Payer: Self-pay | Admitting: Internal Medicine

## 2017-02-09 NOTE — Telephone Encounter (Signed)
Pt is requesting a refill for levothyroxine   Pharmacy: [facility] at [address] [place], [place] [address]

## 2017-02-09 NOTE — Telephone Encounter (Signed)
Patient seen 01/29/2017 for physical with Ivar Drape.  Requested Prescriptions   Pending Prescriptions Disp Refills  . levothyroxine (SYNTHROID, LEVOTHROID) 88 MCG tablet 90 tablet 3    Sig: Take 1 tablet (88 mcg total) by mouth daily before breakfast.    Lab Results  Component Value Date   TSH 2.620 01/29/2017   Refilling medication is outside of clinical staff protocol.   Provider, please prescribe pended medication if appropriate.

## 2017-02-09 NOTE — Telephone Encounter (Signed)
Copied from [place] [id number]. Topic: Quick Communication - Rx Refill/Question >> Feb 09, 2017  5:27 PM [name] wrote: Reason for CRM: levothyroxine [name], LEVOTHROID) 88 MCG tablet [[id number]]  Send to [facility] in [place], [place]

## 2017-02-09 NOTE — Telephone Encounter (Signed)
See refill encounter dated 02/09/17.

## 2017-02-10 NOTE — Telephone Encounter (Signed)
Unfortunately she did not discuss this with me, and we should confirm that she is on the synthroid at this dose.  Or she can contact the physician who gave her this medication for refill.

## 2017-02-12 ENCOUNTER — Encounter: Payer: Self-pay | Admitting: Physician Assistant

## 2017-02-12 ENCOUNTER — Other Ambulatory Visit: Payer: Self-pay

## 2017-02-12 ENCOUNTER — Ambulatory Visit: Payer: BLUE CROSS/BLUE SHIELD | Admitting: Physician Assistant

## 2017-02-12 VITALS — BP 110/72 | HR 89 | Temp 98.0°F | Resp 16 | Ht 62.0 in | Wt 117.6 lb

## 2017-02-12 DIAGNOSIS — J4520 Mild intermittent asthma, uncomplicated: Secondary | ICD-10-CM | POA: Diagnosis not present

## 2017-02-12 MED ORDER — ALBUTEROL SULFATE HFA 108 (90 BASE) MCG/ACT IN AERS: 2.0000 | INHALATION_SPRAY | Freq: Four times a day (QID) | RESPIRATORY_TRACT | 1 refills | Status: DC | PRN

## 2017-02-12 NOTE — Patient Instructions (Signed)
Take some Zyrtec 5-10 mg daily for the next 2 weeks or so.  This can help with allergies but can also help your breathing.  I would suggest using Flonase at least once daily on the right nostril.  This can help shrink mucosal tissue in your nose and may improve your breathing despite having a deviated septum.

## 2017-02-12 NOTE — Progress Notes (Signed)
02/12/2017 12:28 PM   DOB: 1990-09-12 / MRN: [id number]  SUBJECTIVE:  Anne Newton is a 27 y.o. female presenting for history of asthma.  Tells me that she has been coughing and having chest tightness for about the last month.  She took a friend's albuterol inhaler and tells me this resolved her symptoms.  She is asymptomatic at this time and would like a refill.    Most recently seen for a physical with PA English in this office and labs were largely unremarkable at that time.  She has a history of deviated septum and wonders if her asthma is related.  She is allergic to chicken allergy; shrimp [shellfish allergy]; and ibuprofen.   She  has a past medical history of Asthma, Hypothyroidism, Nasal septal deviation (12/2015), and Nasal turbinate hypertrophy (12/2015).    She  reports that  has never smoked. she has never used smokeless tobacco. She reports that she does not drink alcohol or use drugs. She  reports that she currently engages in sexual activity. She reports using the following method of birth control/protection: None. The patient  has a past surgical history that includes Thyroidectomy (Left, 08/28/2012); Thyroidectomy (Right, 10/21/2012); Septoplasty (N/A, 06/30/2014); and Turbinate reduction (N/A, 06/30/2014).  Her family history is not on file.  Review of Systems  Constitutional: Negative for chills, diaphoresis and fever.  Eyes: Negative.   Respiratory: Negative for cough, hemoptysis, sputum production, shortness of breath and wheezing.   Cardiovascular: Negative for chest pain, orthopnea and leg swelling.  Gastrointestinal: Negative for nausea.  Skin: Negative for rash.  Neurological: Negative for dizziness, sensory change, speech change, focal weakness and headaches.    The problem list and medications were reviewed and updated by myself where necessary and exist elsewhere in the encounter.   OBJECTIVE:  BP 110/72 (BP Location: Right Arm, Patient Position: Sitting, Cuff  Size: Normal)   Pulse 89   Temp 98 F (36.7 C) (Oral)   Resp 16   Ht 5\' 2"  (1.575 m)   Wt 117 lb 9.6 oz (53.3 kg)   SpO2 100%   BMI 21.51 kg/m   Physical Exam  Constitutional: She is active.  Non-toxic appearance.  HENT:  Right Ear: Hearing, tympanic membrane, external ear and ear canal normal.  Left Ear: Hearing, tympanic membrane, external ear and ear canal normal.  Nose: Nose normal. Right sinus exhibits no maxillary sinus tenderness and no frontal sinus tenderness. Left sinus exhibits no maxillary sinus tenderness and no frontal sinus tenderness.  Mouth/Throat: Uvula is midline, oropharynx is clear and moist and mucous membranes are normal. Mucous membranes are not dry. No oropharyngeal exudate, posterior oropharyngeal edema or tonsillar abscesses.  Both nares widely patent on inspection.  She is congested.  Cardiovascular: Normal rate.  Pulmonary/Chest: Effort normal. No tachypnea.  Lymphadenopathy:       Head (right side): No submandibular and no tonsillar adenopathy present.       Head (left side): No submandibular and no tonsillar adenopathy present.    She has no cervical adenopathy.  Neurological: She is alert.  Skin: Skin is warm and dry. She is not diaphoretic. No pallor.    No results found for this or any previous visit (from the past 72 hour(s)).  No results found.  ASSESSMENT AND PLAN:  Anne Newton was seen today for asthma.  Diagnoses and all orders for this visit:  Asthma in adult, mild intermittent, uncomplicated: I am refilling her medicine.  Her lung exam is reassuring today. -  albuterol (PROVENTIL HFA;VENTOLIN HFA) 108 (90 Base) MCG/ACT inhaler; Inhale 2 puffs into the lungs every 6 (six) hours as needed for wheezing.    The patient is advised to call or return to clinic if she does not see an improvement in symptoms, or to seek the care of the closest emergency department if she worsens with the above plan.   Philis Fendt, MHS, PA-C [facility] at  [facility] 02/12/2017 12:28 PM

## 2017-02-18 ENCOUNTER — Encounter: Payer: Self-pay | Admitting: Physician Assistant

## 2017-02-18 NOTE — Progress Notes (Signed)
Letter or phone Labs are normal. Kidney and liver function appear unremarkable. No anemia or change in blood cells Thyroid function appear normal. hiv and syphilis are negative. Pap normal without gonorrhea or chlamydia. Sincerely, Ivar Drape, PA-C

## 2017-03-28 ENCOUNTER — Ambulatory Visit: Payer: BLUE CROSS/BLUE SHIELD | Admitting: Physician Assistant

## 2017-03-28 VITALS — BP 102/70 | HR 77 | Temp 98.2°F | Resp 18 | Ht 61.22 in | Wt 124.0 lb

## 2017-03-28 DIAGNOSIS — Z23 Encounter for immunization: Secondary | ICD-10-CM

## 2017-03-28 NOTE — Progress Notes (Unsigned)
Pt is here for her second HPV injection. Pt is within scheduled window. Pt received injection in her right deltoid.

## 2017-04-24 ENCOUNTER — Encounter: Payer: Self-pay | Admitting: Physician Assistant

## 2017-06-21 ENCOUNTER — Encounter: Payer: Self-pay | Admitting: Physician Assistant

## 2017-06-21 ENCOUNTER — Ambulatory Visit: Payer: Self-pay | Admitting: Otolaryngology

## 2017-06-21 ENCOUNTER — Other Ambulatory Visit: Payer: Self-pay

## 2017-06-21 ENCOUNTER — Encounter (HOSPITAL_BASED_OUTPATIENT_CLINIC_OR_DEPARTMENT_OTHER): Payer: Self-pay | Admitting: *Deleted

## 2017-06-21 ENCOUNTER — Ambulatory Visit (INDEPENDENT_AMBULATORY_CARE_PROVIDER_SITE_OTHER): Payer: BLUE CROSS/BLUE SHIELD | Admitting: Physician Assistant

## 2017-06-21 VITALS — BP 100/60 | HR 73 | Temp 98.7°F | Resp 16 | Ht 61.0 in | Wt 119.4 lb

## 2017-06-21 DIAGNOSIS — E890 Postprocedural hypothyroidism: Secondary | ICD-10-CM

## 2017-06-21 NOTE — H&P (Signed)
PREOPERATIVE H&P  Chief Complaint: trouble breathing through the right side of her nose  HPI: Anne Newton is a 26 y.o. female who presents for evaluation of of chronic trouble breathing through the right side of her nose. She especially has trouble breathing when she is exercising especially on the right side. She had previous septoplasty performed 3 years ago by myself. On exam she has persistent deviation of the septum to the right with moderate turbinate hypertrophy especially on the left side. She will require revision septoplasty and turbinate reductions.  Past Medical History:  Diagnosis Date  . Asthma    prn inhaler  . Hypothyroidism   . Nasal septal deviation 05/2017  . Nasal turbinate hypertrophy 05/2017   Past Surgical History:  Procedure Laterality Date  . SEPTOPLASTY N/A 06/30/2014   Procedure: SEPTOPLASTY;  Surgeon: Rozetta Nunnery, MD;  Location: [facility];  Service: ENT;  Laterality: N/A;  . THYROIDECTOMY Left 08/28/2012   Procedure: LEFT THYROID LOBECTOMY;  Surgeon: Rozetta Nunnery, MD;  Location: [facility];  Service: ENT;  Laterality: Left;  . THYROIDECTOMY Right 10/21/2012   Procedure: COMPLETION THYROIDECTOMY (RIGHT THYROID LOBECTOMY);  Surgeon: Rozetta Nunnery, MD;  Location: [facility];  Service: ENT;  Laterality: Right;  . TURBINATE REDUCTION N/A 06/30/2014   Procedure: [place] REDUCTION;  Surgeon: Rozetta Nunnery, MD;  Location: [facility];  Service: ENT;  Laterality: N/A;   Social History   Socioeconomic History  . Marital status: Married    Spouse name: Not on file  . Number of children: Not on file  . Years of education: Not on file  . Highest education level: Not on file  Occupational History  . Not on file  Social Needs  . Financial resource strain: Not on file  . Food insecurity:    Worry: Not on file    Inability: Not on file  . Transportation needs:    Medical: Not on file    Non-medical: Not on file   Tobacco Use  . Smoking status: Never Smoker  . Smokeless tobacco: Never Used  Substance and Sexual Activity  . Alcohol use: No  . Drug use: No  . Sexual activity: Yes    Birth control/protection: None  Lifestyle  . Physical activity:    Days per week: Not on file    Minutes per session: Not on file  . Stress: Not on file  Relationships  . Social connections:    Talks on phone: Not on file    Gets together: Not on file    Attends religious service: Not on file    Active member of club or organization: Not on file    Attends meetings of clubs or organizations: Not on file    Relationship status: Not on file  Other Topics Concern  . Not on file  Social History Narrative  . Not on file   History reviewed. No pertinent family history. Allergies  Allergen Reactions  . Chicken Allergy Shortness Of Breath    EXACERBATES ASTHMA  . Shrimp [Shellfish Allergy] Shortness Of Breath    EXACERBATES ASTHMA   Prior to Admission medications   Medication Sig Start Date End Date Taking? Authorizing Provider  albuterol (PROVENTIL HFA;VENTOLIN HFA) 108 (90 Base) MCG/ACT inhaler Inhale 2 puffs into the lungs every 6 (six) hours as needed for wheezing. 02/12/17  Yes Tereasa Coop, PA-C  levothyroxine (SYNTHROID, LEVOTHROID) 88 MCG tablet Take 88 mcg by mouth daily before breakfast.  Yes [provider]     Positive ROS: trouble breathing through the right side  All other systems have been reviewed and were otherwise negative with the exception of those mentioned in the HPI and as above.  Physical Exam: There were no vitals filed for this visit.  General: Alert, no acute distress Oral: Normal oral mucosa and tonsils Nasal: septal deviation to the right with compensatory turbinate hypertrophy. No polyps noted. No signs of infection. Neck: No palpable adenopathy or thyroid nodules Ear: Ear canal is clear with normal appearing TMs Cardiovascular: Regular rate and rhythm, no  murmur.  Respiratory: Clear to auscultation Neurologic: Alert and oriented x 3   Assessment/Plan: DEVIATED SEPTUM Plan for Procedure(s): SEPTOPLASTY BILATERAL TURBINATE REDUCTION   Melony Overly, MD 06/21/2017 7:04 PM

## 2017-06-21 NOTE — Progress Notes (Signed)
   Anne Newton  MRN: [id number] DOB: [birth year]  PCP: Foye Spurling, MD  Chief Complaint  Patient presents with  . Medication Refill    Subjective:  Pt presents to clinic for check of her hypothyroidism.  She has a surgically absent thyroid and thinks it has been a long time since her last medication adjustment.  She is having no symptoms.  History is obtained by patient.  Review of Systems  Patient Active Problem List   Diagnosis Date Noted  . Hepatitis B immune 12/15/2014  . Thyroid cancer ([place]) 12/16/2013  . Hypothyroidism 12/16/2013  . Unspecified asthma(493.90) 07/03/2012  . Enlargement of lymph nodes 07/03/2012    Current Outpatient Medications on File Prior to Visit  Medication Sig Dispense Refill  . albuterol (PROVENTIL HFA;VENTOLIN HFA) 108 (90 Base) MCG/ACT inhaler Inhale 2 puffs into the lungs every 6 (six) hours as needed for wheezing. 1 Inhaler 1  . levothyroxine (SYNTHROID, LEVOTHROID) 88 MCG tablet Take 88 mcg by mouth daily before breakfast.     No current facility-administered medications on file prior to visit.     Allergies  Allergen Reactions  . Chicken Allergy Shortness Of Breath    EXACERBATES ASTHMA  . Shrimp [Shellfish Allergy] Shortness Of Breath    EXACERBATES ASTHMA    Past Medical History:  Diagnosis Date  . Asthma    prn inhaler  . Hypothyroidism   . Nasal septal deviation 05/2017  . Nasal turbinate hypertrophy 05/2017   Social History   Social History Narrative  . Not on file   Social History   Tobacco Use  . Smoking status: Never Smoker  . Smokeless tobacco: Never Used  Substance Use Topics  . Alcohol use: No  . Drug use: No   family history is not on file.     Objective:  BP 100/60 (BP Location: Left Arm, Patient Position: Sitting, Cuff Size: Normal)   Pulse 73   Temp 98.7 F (37.1 C) (Oral)   Resp 16   Ht 5\' 1"  (1.549 m)   Wt 119 lb 6.4 oz (54.2 kg)   LMP 05/22/2017   SpO2 98%   BMI 22.56 kg/m  Body mass  index is 22.56 kg/m.  Physical Exam  Constitutional: She is oriented to person, place, and time.  HENT:  Head: Normocephalic and atraumatic.  Right Ear: Hearing and external ear normal.  Left Ear: Hearing and external ear normal.  Eyes: Conjunctivae are normal.  Neck: Normal range of motion.    Cardiovascular: Normal rate, regular rhythm and normal heart sounds.  No murmur heard. Pulmonary/Chest: Effort normal and breath sounds normal. She has no wheezes.  Neurological: She is alert and oriented to person, place, and time.  Skin: Skin is warm and dry.  Psychiatric: Judgment normal.  Vitals reviewed.   Assessment and Plan :  Postoperative hypothyroidism - Plan: TSH check labs and adjust medications is necessary based on lab results.  Will send in medication after the results are back.  Windell Hummingbird PA-C  [facility] 06/21/2017 4:19 PM

## 2017-06-21 NOTE — Patient Instructions (Addendum)
  When we get your lab results I will send in your medication to the pharmacy.  I will contact you with your lab results as soon as they are available.   If you have not heard from me in 2 weeks, please contact me.  The fastest way to get your results is to register for My Chart (see the instructions on this printout).    IF you received an x-ray today, you will receive an invoice from [facility]. Please contact [facility] at [phone] with questions or concerns regarding your invoice.   IF you received labwork today, you will receive an invoice from [employer]. Please contact [facility] at [phone] with questions or concerns regarding your invoice.   Our billing staff will not be able to assist you with questions regarding bills from these companies.  You will be contacted with the lab results as soon as they are available. The fastest way to get your results is to activate your My Chart account. Instructions are located on the last page of this paperwork. If you have not heard from [place] regarding the results in 2 weeks, please contact this office.

## 2017-06-22 LAB — TSH: TSH: 0.534 u[IU]/mL (ref 0.450–4.500)

## 2017-06-25 ENCOUNTER — Telehealth: Payer: Self-pay | Admitting: Physician Assistant

## 2017-06-25 ENCOUNTER — Encounter: Payer: Self-pay | Admitting: *Deleted

## 2017-06-25 MED ORDER — LEVOTHYROXINE SODIUM 88 MCG PO TABS
88.0000 ug | ORAL_TABLET | Freq: Every day | ORAL | 1 refills | Status: DC
Start: 2017-06-25 — End: 2017-11-16

## 2017-06-25 NOTE — Telephone Encounter (Signed)
Pt given results per notes of Anne Newton on 06/25/17 Pt verbalized understanding.Unable to document in result note due to result note not being routed to [facility].  Pt also stating that medication was not covered due to insurance but was unsure as to why the medication was not covered. [facility] on [address]  contacted who states that medicaid would not cover a 90 day supply so prescription was ran as a 30 day supply to be covered by [employer].  Pt called and notified that medication would be available for pick up today. Pt verbalized understanding.

## 2017-06-25 NOTE — Addendum Note (Signed)
Addended by: Mancel Bale on: 06/25/2017 11:48 AM   Modules accepted: Orders

## 2017-06-26 ENCOUNTER — Encounter (HOSPITAL_BASED_OUTPATIENT_CLINIC_OR_DEPARTMENT_OTHER): Payer: Self-pay | Admitting: Emergency Medicine

## 2017-06-26 ENCOUNTER — Ambulatory Visit (HOSPITAL_BASED_OUTPATIENT_CLINIC_OR_DEPARTMENT_OTHER): Payer: BLUE CROSS/BLUE SHIELD | Admitting: Anesthesiology

## 2017-06-26 ENCOUNTER — Other Ambulatory Visit: Payer: Self-pay

## 2017-06-26 ENCOUNTER — Encounter (HOSPITAL_BASED_OUTPATIENT_CLINIC_OR_DEPARTMENT_OTHER)
Admission: RE | Disposition: A | Discharge status: Home / Self Care | Payer: Self-pay | Source: Ambulatory Visit | Attending: Otolaryngology

## 2017-06-26 ENCOUNTER — Ambulatory Visit (HOSPITAL_BASED_OUTPATIENT_CLINIC_OR_DEPARTMENT_OTHER)
Admission: RE | Admit: 2017-06-26 | Discharge: 2017-06-26 | Disposition: A | Discharge status: Home / Self Care | Payer: BLUE CROSS/BLUE SHIELD | Source: Ambulatory Visit | Attending: Otolaryngology | Admitting: Otolaryngology

## 2017-06-26 DIAGNOSIS — J45909 Unspecified asthma, uncomplicated: Secondary | ICD-10-CM | POA: Insufficient documentation

## 2017-06-26 DIAGNOSIS — E039 Hypothyroidism, unspecified: Secondary | ICD-10-CM | POA: Insufficient documentation

## 2017-06-26 DIAGNOSIS — J343 Hypertrophy of nasal turbinates: Secondary | ICD-10-CM | POA: Insufficient documentation

## 2017-06-26 DIAGNOSIS — Z79899 Other long term (current) drug therapy: Secondary | ICD-10-CM | POA: Insufficient documentation

## 2017-06-26 DIAGNOSIS — J342 Deviated nasal septum: Secondary | ICD-10-CM | POA: Insufficient documentation

## 2017-06-26 HISTORY — PX: TURBINATE REDUCTION: SHX6157

## 2017-06-26 HISTORY — PX: SEPTOPLASTY: SHX2393

## 2017-06-26 LAB — POCT PREGNANCY, URINE
Preg Test, Ur: NEGATIVE
Preg Test, Ur: NEGATIVE

## 2017-06-26 SURGERY — SEPTOPLASTY, NOSE
Anesthesia: General | Site: Nose

## 2017-06-26 MED ORDER — SODIUM CHLORIDE 0.9 % IV SOLN
INTRAVENOUS | Status: AC | PRN
  Administered 2017-06-26: 200 mL via INTRAMUSCULAR

## 2017-06-26 MED ORDER — SUCCINYLCHOLINE CHLORIDE 200 MG/10ML IV SOSY
PREFILLED_SYRINGE | INTRAVENOUS | Status: AC
  Filled 2017-06-26: qty 10

## 2017-06-26 MED ORDER — LIDOCAINE HCL (CARDIAC) PF 100 MG/5ML IV SOSY
PREFILLED_SYRINGE | INTRAVENOUS | Status: DC | PRN
  Administered 2017-06-26: 100 mg via INTRAVENOUS

## 2017-06-26 MED ORDER — MUPIROCIN 2 % EX OINT
TOPICAL_OINTMENT | CUTANEOUS | Status: DC | PRN
  Administered 2017-06-26 (×2): 1 via NASAL

## 2017-06-26 MED ORDER — ROCURONIUM BROMIDE 100 MG/10ML IV SOLN
INTRAVENOUS | Status: DC | PRN
  Administered 2017-06-26: 40 mg via INTRAVENOUS

## 2017-06-26 MED ORDER — LACTATED RINGERS IV SOLN
INTRAVENOUS | Status: DC
  Administered 2017-06-26 (×3): via INTRAVENOUS

## 2017-06-26 MED ORDER — DEXAMETHASONE SODIUM PHOSPHATE 4 MG/ML IJ SOLN
INTRAMUSCULAR | Status: DC | PRN
  Administered 2017-06-26: 10 mg via INTRAVENOUS

## 2017-06-26 MED ORDER — ROCURONIUM BROMIDE 10 MG/ML (PF) SYRINGE
PREFILLED_SYRINGE | INTRAVENOUS | Status: AC
  Filled 2017-06-26: qty 10

## 2017-06-26 MED ORDER — PHENYLEPHRINE 40 MCG/ML (10ML) SYRINGE FOR IV PUSH (FOR BLOOD PRESSURE SUPPORT)
PREFILLED_SYRINGE | INTRAVENOUS | Status: AC
  Filled 2017-06-26: qty 10

## 2017-06-26 MED ORDER — CHLORHEXIDINE GLUCONATE CLOTH 2 % EX PADS: 6.0000 | MEDICATED_PAD | Freq: Once | CUTANEOUS | Status: DC

## 2017-06-26 MED ORDER — PROMETHAZINE HCL 25 MG/ML IJ SOLN
INTRAMUSCULAR | Status: AC
  Filled 2017-06-26: qty 1

## 2017-06-26 MED ORDER — MIDAZOLAM HCL 2 MG/2ML IJ SOLN
1.0000 mg | INTRAMUSCULAR | Status: DC | PRN
  Administered 2017-06-26: 2 mg via INTRAVENOUS

## 2017-06-26 MED ORDER — MEPERIDINE HCL 25 MG/ML IJ SOLN: 6.2500 mg | INTRAMUSCULAR | Status: DC | PRN

## 2017-06-26 MED ORDER — METHYLPREDNISOLONE ACETATE 80 MG/ML IJ SUSP
INTRAMUSCULAR | Status: AC
  Filled 2017-06-26: qty 1

## 2017-06-26 MED ORDER — LIDOCAINE-EPINEPHRINE 1 %-1:100000 IJ SOLN
INTRAMUSCULAR | Status: DC | PRN
  Administered 2017-06-26: 7 mL

## 2017-06-26 MED ORDER — CEFAZOLIN SODIUM-DEXTROSE 2-4 GM/100ML-% IV SOLN
INTRAVENOUS | Status: AC
  Filled 2017-06-26: qty 100

## 2017-06-26 MED ORDER — CEFAZOLIN SODIUM-DEXTROSE 2-4 GM/100ML-% IV SOLN
2.0000 g | INTRAVENOUS | Status: AC
  Administered 2017-06-26: 2 g via INTRAVENOUS

## 2017-06-26 MED ORDER — ONDANSETRON HCL 4 MG/2ML IJ SOLN
INTRAMUSCULAR | Status: AC
  Filled 2017-06-26: qty 2

## 2017-06-26 MED ORDER — SCOPOLAMINE 1 MG/3DAYS TD PT72: 1.0000 | MEDICATED_PATCH | Freq: Once | TRANSDERMAL | Status: DC | PRN

## 2017-06-26 MED ORDER — OXYMETAZOLINE HCL 0.05 % NA SOLN
NASAL | Status: DC | PRN
  Administered 2017-06-26: 1 via TOPICAL

## 2017-06-26 MED ORDER — MIDAZOLAM HCL 2 MG/2ML IJ SOLN
INTRAMUSCULAR | Status: AC
  Filled 2017-06-26: qty 2

## 2017-06-26 MED ORDER — DEXAMETHASONE SODIUM PHOSPHATE 10 MG/ML IJ SOLN
INTRAMUSCULAR | Status: AC
  Filled 2017-06-26: qty 1

## 2017-06-26 MED ORDER — ONDANSETRON HCL 4 MG/2ML IJ SOLN
INTRAMUSCULAR | Status: DC | PRN
  Administered 2017-06-26: 4 mg via INTRAVENOUS

## 2017-06-26 MED ORDER — EPHEDRINE SULFATE 50 MG/ML IJ SOLN
INTRAMUSCULAR | Status: DC | PRN
  Administered 2017-06-26 (×2): 5 mg via INTRAVENOUS
  Administered 2017-06-26: 10 mg via INTRAVENOUS

## 2017-06-26 MED ORDER — FENTANYL CITRATE (PF) 100 MCG/2ML IJ SOLN: 50.0000 ug | INTRAMUSCULAR | Status: DC | PRN

## 2017-06-26 MED ORDER — SUFENTANIL CITRATE 50 MCG/ML IV SOLN
INTRAVENOUS | Status: DC | PRN
  Administered 2017-06-26: 20 ug via INTRAVENOUS

## 2017-06-26 MED ORDER — SUFENTANIL CITRATE 50 MCG/ML IV SOLN
INTRAVENOUS | Status: AC
  Filled 2017-06-26: qty 1

## 2017-06-26 MED ORDER — HYDROMORPHONE HCL 1 MG/ML IJ SOLN: 0.2500 mg | INTRAMUSCULAR | Status: DC | PRN

## 2017-06-26 MED ORDER — MUPIROCIN 2 % EX OINT
TOPICAL_OINTMENT | CUTANEOUS | Status: AC
  Filled 2017-06-26: qty 22

## 2017-06-26 MED ORDER — LIDOCAINE HCL (CARDIAC) PF 100 MG/5ML IV SOSY
PREFILLED_SYRINGE | INTRAVENOUS | Status: AC
  Filled 2017-06-26: qty 5

## 2017-06-26 MED ORDER — SUGAMMADEX SODIUM 500 MG/5ML IV SOLN
INTRAVENOUS | Status: AC
  Filled 2017-06-26: qty 5

## 2017-06-26 MED ORDER — OXYCODONE HCL 5 MG/5ML PO SOLN: 5.0000 mg | Freq: Once | ORAL | Status: DC | PRN

## 2017-06-26 MED ORDER — PROMETHAZINE HCL 25 MG/ML IJ SOLN
6.2500 mg | INTRAMUSCULAR | Status: DC | PRN
  Administered 2017-06-26: 12.5 mg via INTRAVENOUS

## 2017-06-26 MED ORDER — OXYCODONE HCL 5 MG PO TABS: 5.0000 mg | ORAL_TABLET | Freq: Once | ORAL | Status: DC | PRN

## 2017-06-26 MED ORDER — OXYMETAZOLINE HCL 0.05 % NA SOLN
NASAL | Status: AC
  Filled 2017-06-26: qty 15

## 2017-06-26 MED ORDER — EPHEDRINE SULFATE 50 MG/ML IJ SOLN
INTRAMUSCULAR | Status: AC
  Filled 2017-06-26: qty 1

## 2017-06-26 MED ORDER — PROPOFOL 500 MG/50ML IV EMUL
INTRAVENOUS | Status: AC
  Filled 2017-06-26: qty 50

## 2017-06-26 MED ORDER — CEPHALEXIN 500 MG PO CAPS: 500.0000 mg | ORAL_CAPSULE | Freq: Two times a day (BID) | ORAL | 0 refills | Status: DC

## 2017-06-26 MED ORDER — SUGAMMADEX SODIUM 200 MG/2ML IV SOLN
INTRAVENOUS | Status: DC | PRN
  Administered 2017-06-26: 125 mg via INTRAVENOUS

## 2017-06-26 MED ORDER — HYDROCODONE-ACETAMINOPHEN 5-325 MG PO TABS: 1.0000 | ORAL_TABLET | ORAL | 0 refills | Status: DC | PRN

## 2017-06-26 MED ORDER — LIDOCAINE-EPINEPHRINE 1 %-1:100000 IJ SOLN
INTRAMUSCULAR | Status: AC
  Filled 2017-06-26: qty 1

## 2017-06-26 SURGICAL SUPPLY — 42 items
APPLICATOR COTTON TIP 6 STRL (MISCELLANEOUS) IMPLANT
APPLICATOR COTTON TIP 6IN STRL (MISCELLANEOUS)
ATTRACTOMAT 16X20 MAGNETIC DRP (DRAPES) ×3 IMPLANT
BLADE EAR TYMPAN 2.5 60D BEAV (BLADE) ×3 IMPLANT
BLADE INF TURB ROT M4 2 5PK (BLADE) ×3 IMPLANT
BLADE SURG 15 STRL LF DISP TIS (BLADE) ×2 IMPLANT
BLADE SURG 15 STRL SS (BLADE) ×1
CANISTER SUCT 1200ML W/VALVE (MISCELLANEOUS) ×3 IMPLANT
COAGULATOR SUCT 8FR VV (MISCELLANEOUS) ×3 IMPLANT
COVER MAYO STAND STRL (DRAPES) ×3 IMPLANT
DECANTER SPIKE VIAL GLASS SM (MISCELLANEOUS) IMPLANT
DRSG TELFA 3X8 NADH (GAUZE/BANDAGES/DRESSINGS) ×3 IMPLANT
ELECT REM PT RETURN 9FT ADLT (ELECTROSURGICAL) ×3
ELECTRODE REM PT RTRN 9FT ADLT (ELECTROSURGICAL) ×2 IMPLANT
GLOVE BIO SURGEON STRL SZ 6.5 (GLOVE) ×3 IMPLANT
GLOVE BIOGEL PI IND STRL 7.0 (GLOVE) ×4 IMPLANT
GLOVE BIOGEL PI INDICATOR 7.0 (GLOVE) ×2
GLOVE SS BIOGEL STRL SZ 7.5 (GLOVE) ×2 IMPLANT
GLOVE SUPERSENSE BIOGEL SZ 7.5 (GLOVE) ×1
GOWN STRL REUS W/ TWL LRG LVL3 (GOWN DISPOSABLE) ×6 IMPLANT
GOWN STRL REUS W/TWL LRG LVL3 (GOWN DISPOSABLE) ×3
IV NS 500ML (IV SOLUTION)
IV NS 500ML BAXH (IV SOLUTION) IMPLANT
NEEDLE PRECISIONGLIDE 27X1.5 (NEEDLE) ×3 IMPLANT
NS IRRIG 1000ML POUR BTL (IV SOLUTION) IMPLANT
PACK BASIN DAY SURGERY FS (CUSTOM PROCEDURE TRAY) ×3 IMPLANT
PACK ENT DAY SURGERY (CUSTOM PROCEDURE TRAY) ×3 IMPLANT
PATTIES SURGICAL .5 X3 (DISPOSABLE) ×3 IMPLANT
SHEET MEDIUM DRAPE 40X70 STRL (DRAPES) ×3 IMPLANT
SLEEVE SCD COMPRESS KNEE MED (MISCELLANEOUS) ×3 IMPLANT
SPONGE GAUZE 2X2 8PLY STRL LF (GAUZE/BANDAGES/DRESSINGS) ×3 IMPLANT
SUT CHROMIC 4 0 PS 2 18 (SUTURE) ×3 IMPLANT
SUT ETHILON 3 0 PS 1 (SUTURE) ×3 IMPLANT
SUT SILK 2 0 PERMA HAND 18 BK (SUTURE) ×3 IMPLANT
SUT VIC AB 4-0 P-3 18XBRD (SUTURE) IMPLANT
SUT VIC AB 4-0 P3 18 (SUTURE)
SYR 3ML 18GX1 1/2 (SYRINGE) IMPLANT
SYR CONTROL 10ML LL (SYRINGE) ×3 IMPLANT
TOWEL GREEN STERILE FF (TOWEL DISPOSABLE) ×6 IMPLANT
TRAY DSU PREP LF (CUSTOM PROCEDURE TRAY) ×3 IMPLANT
TUBE CONNECTING 20X1/4 (TUBING) ×3 IMPLANT
YANKAUER SUCT BULB TIP NO VENT (SUCTIONS) ×3 IMPLANT

## 2017-06-26 NOTE — Transfer of Care (Signed)
Immediate Anesthesia Transfer of Care Note  [name]  Procedure(s) Performed: SEPTOPLASTY (N/A Nose) BILATERAL TURBINATE REDUCTION (Bilateral Nose)  Patient Location: PACU  Anesthesia Type:General  Level of Consciousness: awake, alert  and drowsy  Airway & Oxygen Therapy: Patient Spontanous Breathing and Patient connected to face mask oxygen  Post-op Assessment: Report given to RN and Post -op Vital signs reviewed and stable  Post vital signs: Reviewed and stable  Last Vitals:  Vitals Value Taken Time  BP 122/70 06/26/2017  9:53 AM  Temp    Pulse 94 06/26/2017  9:55 AM  Resp 20 06/26/2017  9:55 AM  SpO2 100 % 06/26/2017  9:55 AM  Vitals shown include unvalidated device data.  Last Pain:  Vitals:   06/26/17 0646  TempSrc: Oral  PainSc: 0-No pain         Complications: No apparent anesthesia complications

## 2017-06-26 NOTE — Anesthesia Procedure Notes (Signed)

## 2017-06-26 NOTE — Anesthesia Procedure Notes (Signed)
Performed by: Aram Domzalski D, CRNA       

## 2017-06-26 NOTE — Anesthesia Postprocedure Evaluation (Signed)
Anesthesia Post Note  [name]  Procedure(s) Performed: SEPTOPLASTY (N/A Nose) BILATERAL TURBINATE REDUCTION (Bilateral Nose)     Patient location during evaluation: PACU Anesthesia Type: General Level of consciousness: sedated and patient cooperative Pain management: pain level controlled Vital Signs Assessment: post-procedure vital signs reviewed and stable Respiratory status: spontaneous breathing Cardiovascular status: stable Anesthetic complications: no    Last Vitals:  Vitals:   06/26/17 0646 06/26/17 0953  BP: 103/68 122/70  Pulse: 73 99  Resp: 16 17  Temp: 36.7 C 37 C  SpO2: 100% 100%    Last Pain:  Vitals:   06/26/17 1215  TempSrc:   PainSc: 0-No pain                 Nolon Nations

## 2017-06-26 NOTE — Anesthesia Preprocedure Evaluation (Signed)
Anesthesia Evaluation  Patient identified by MRN, date of birth, ID band Patient awake    Reviewed: Allergy & Precautions, NPO status , Patient's Chart, lab work & pertinent test results  Airway Mallampati: II   Neck ROM: full    Dental   Pulmonary asthma ,    breath sounds clear to auscultation       Cardiovascular negative cardio ROS   Rhythm:regular Rate:Normal     Neuro/Psych    GI/Hepatic   Endo/Other  Hypothyroidism   Renal/GU      Musculoskeletal   Abdominal   Peds  Hematology   Anesthesia Other Findings   Reproductive/Obstetrics                             Anesthesia Physical  Anesthesia Plan  ASA: II  Anesthesia Plan: General   Post-op Pain Management:    Induction: Intravenous  PONV Risk Score and Plan: 4 or greater and Ondansetron, Dexamethasone, Midazolam and Scopolamine patch - Pre-op  Airway Management Planned: Oral ETT  Additional Equipment:   Intra-op Plan:   Post-operative Plan: Extubation in OR  Informed Consent: I have reviewed the patients History and Physical, chart, labs and discussed the procedure including the risks, benefits and alternatives for the proposed anesthesia with the patient or authorized representative who has indicated his/her understanding and acceptance.   Dental advisory given  Plan Discussed with: CRNA  Anesthesia Plan Comments:         Anesthesia Quick Evaluation

## 2017-06-26 NOTE — Brief Op Note (Signed)
06/26/2017  9:45 AM  PATIENT:  Anne Newton  27 y.o. female  PRE-OPERATIVE DIAGNOSIS:  DEVIATED SEPTUM TURBINATE HYPERTROPHY  POST-OPERATIVE DIAGNOSIS:  DEVIATED SEPTUM TURBINATE HYPERTROPHY  PROCEDURE:  Procedure(s): SEPTOPLASTY (N/A) BILATERAL TURBINATE REDUCTION (Bilateral)  SURGEON:  Surgeon(s) and Role:    Rozetta Nunnery, MD - Primary  PHYSICIAN ASSISTANT:   ASSISTANTS: none   ANESTHESIA:   general  EBL:  minimal   BLOOD ADMINISTERED:none  DRAINS: none   LOCAL MEDICATIONS USED:  XYLOCAINE with EPI  7 cc  SPECIMEN:  No Specimen  DISPOSITION OF SPECIMEN:  N/A  COUNTS:  YES  TOURNIQUET:  * No tourniquets in log *  DICTATION: .Other Dictation: Dictation Number [id number]  PLAN OF CARE: Discharge to home after PACU  PATIENT DISPOSITION:  PACU - hemodynamically stable.   Delay start of Pharmacological VTE agent (>24hrs) due to surgical blood loss or risk of bleeding: yes

## 2017-06-26 NOTE — Interval H&P Note (Signed)
History and Physical Interval Note:  06/26/2017 7:29 AM  Anne Newton  has presented today for surgery, with the diagnosis of DEVIATED SEPTUM  The various methods of treatment have been discussed with the patient and family. After consideration of risks, benefits and other options for treatment, the patient has consented to  Procedure(s): SEPTOPLASTY (N/A) BILATERAL TURBINATE REDUCTION (Bilateral) as a surgical intervention .  The patient's history has been reviewed, patient examined, no change in status, stable for surgery.  I have reviewed the patient's chart and labs.  Questions were answered to the patient's satisfaction.     Melony Overly

## 2017-06-26 NOTE — Discharge Instructions (Addendum)
°  Can apply cool compress to nose to reduce swelling and bleeding. Start Keflex 500 mg twice per day tonight Tylenol or hydrocodone tabs 5 mg 1 every 4 hrs prn pain Return to Dr Pollie Friar office tomorrow at 11:15 to have nasal packs removed     Post Anesthesia Home Care Instructions  Activity: Get plenty of rest for the remainder of the day. A responsible individual must stay with you for 24 hours following the procedure.  For the next 24 hours, DO NOT: -Drive a car -Paediatric nurse -Drink alcoholic beverages -Take any medication unless instructed by your physician -Make any legal decisions or sign important papers.  Meals: Start with liquid foods such as gelatin or soup. Progress to regular foods as tolerated. Avoid greasy, spicy, heavy foods. If nausea and/or vomiting occur, drink only clear liquids until the nausea and/or vomiting subsides. Call your physician if vomiting continues.  Special Instructions/Symptoms: Your throat may feel dry or sore from the anesthesia or the breathing tube placed in your throat during surgery. If this causes discomfort, gargle with warm salt water. The discomfort should disappear within 24 hours.  If you had a scopolamine patch placed behind your ear for the management of post- operative nausea and/or vomiting:  1. The medication in the patch is effective for 72 hours, after which it should be removed.  Wrap patch in a tissue and discard in the trash. Wash hands thoroughly with soap and water. 2. You may remove the patch earlier than 72 hours if you experience unpleasant side effects which may include dry mouth, dizziness or visual disturbances. 3. Avoid touching the patch. Wash your hands with soap and water after contact with the patch.

## 2017-06-27 ENCOUNTER — Encounter (HOSPITAL_BASED_OUTPATIENT_CLINIC_OR_DEPARTMENT_OTHER): Payer: Self-pay | Admitting: Otolaryngology

## 2017-06-27 NOTE — Op Note (Signed)
NAME: Anne Newton, Anne Newton MEDICAL RECORD TD:[id number] ACCOUNT [id number] DATE OF BIRTH:22-Feb-1990 FACILITY: [facility] LOCATION: [facility] PHYSICIAN:CHRISTOPHER Lincoln Maxin, MD  OPERATIVE REPORT  DATE OF PROCEDURE:  06/26/2017  PREOPERATIVE DIAGNOSIS:  Septal deviation to the right with turbinate hypertrophy.  POSTOPERATIVE DIAGNOSIS:  Septal deviation to the right with turbinate hypertrophy.  PROCEDURE PERFORMED:  Septoplasty with bilateral inferior turbinate reductions with Medtronic turbinate blade.  SURGEON:  Melony Overly, MD  ANESTHESIA:  General endotracheal.  COMPLICATIONS:  None.  BRIEF CLINICAL NOTE:  The patient is a 26 year old female who has had a previous septoplasty and turbinate reductions performed 3 years ago.  She has persistent blockage on the right side with recurrent anterior cartilaginous deviation to the right  airway causing a very narrow right nasal pathway compared to the left.  She has moderate sized turbinates.  Remaining nasal exam was clear.  She was taken to the operating room at this time for revision septoplasty and bilateral inferior turbinate  reductions.  DESCRIPTION OF PROCEDURE:  After adequate endotracheal anesthesia, the patient received 2 grams Ancef IV preoperatively.  The nose was decongested with cotton pledgets soaked in Afrin.  On examination of the septum, the patient had septum bowed more to  the right side, although anteriorly the caudal edge of the septum protruded more to the left.  This caused a narrow right nasal passageway.  More posteriorly, there was no septal spurs.  It was elected to perform a hemitransfixion incision along the left  side of the septum on the caudal side inferiorly.  There were some adhesions in this area and this was carefully dissected free and the dissection was carried down along the mucoperichondrium on the right side of the cartilaginous septum.  The anterior  1-2 mm of cartilage that bowed into the left side was  removed.  The more posteriorly, a portion of the cartilaginous septum had deviated off of the maxillary crest and a small 1-2 mm strip of inferior cartilaginous septum was removed off of the maxillary  crest.  More posteriorly, another portion of the cartilage septum bowed to the right and this was removed.  Then, at the attachment of the bony and cartilaginous septum superiorly, a vertical incision was made and some of the bony septum that protruded  more to the right side was removed with the Takahashi forceps.  This allowed the septum removed much better toward midline, although there is still some slight curvature of the anterior cartilaginous septum to the right.  The maxillary crest was  fractured slightly to the left side to allow more central positioning of the septum.  This completed the septoplasty portion of the procedure.  Next, the inferior turbinate reductions were performed with microdebrider bilaterally after injecting the  turbinates with Xylocaine with saline.  After performing submucosal resections of the inferior turbinates, the remaining turbinate bone was outfractured.  Hemostasis on the turbinates was obtained with suction cautery.  This completed the turbinate  reductions.  The hemitransfixion incision was closed with 2 interrupted 4-0 chromic sutures.  The septum was ____ with a 4-0 chromic suture.  Then, splints were secured to either side of the septum with a 3-0 nylon suture.  The nose was packed with a  Telfa soaked in bacitracin ointment and placed on either side.  This completed the procedure.  Oropharynx was suctioned of any remaining blood and the patient was awoken from anesthesia and transferred to recovery room postop doing well.  DISPOSITION:  She was discharged home later this  morning on Keflex 500 mg b.i.d. for [redacted] week along with Tylenol and hydrocodone p.r.n. pain.  We will have her follow up in my office tomorrow to have her nasal packs removed and will follow up in a  week to  have her splints removed.  GN/NUANCE  D:06/26/2017 T:06/26/2017 JOB:[id number]

## 2017-06-29 ENCOUNTER — Ambulatory Visit: Payer: BLUE CROSS/BLUE SHIELD

## 2017-07-04 ENCOUNTER — Other Ambulatory Visit: Payer: Self-pay

## 2017-07-04 ENCOUNTER — Ambulatory Visit (INDEPENDENT_AMBULATORY_CARE_PROVIDER_SITE_OTHER): Payer: BLUE CROSS/BLUE SHIELD | Admitting: Physician Assistant

## 2017-07-04 ENCOUNTER — Encounter: Payer: Self-pay | Admitting: Physician Assistant

## 2017-07-04 VITALS — BP 100/70 | HR 72 | Temp 98.5°F | Ht 61.0 in | Wt 119.4 lb

## 2017-07-04 DIAGNOSIS — Z23 Encounter for immunization: Secondary | ICD-10-CM | POA: Diagnosis not present

## 2017-07-04 DIAGNOSIS — Z30011 Encounter for initial prescription of contraceptive pills: Secondary | ICD-10-CM

## 2017-07-04 DIAGNOSIS — N926 Irregular menstruation, unspecified: Secondary | ICD-10-CM | POA: Diagnosis not present

## 2017-07-04 DIAGNOSIS — R4589 Other symptoms and signs involving emotional state: Secondary | ICD-10-CM

## 2017-07-04 LAB — POCT URINE PREGNANCY: Preg Test, Ur: NEGATIVE

## 2017-07-04 MED ORDER — LEVONORGESTREL-ETHINYL ESTRAD 0.1-20 MG-MCG PO TABS: 1.0000 | ORAL_TABLET | Freq: Every day | ORAL | 11 refills | Status: DC

## 2017-07-04 NOTE — Progress Notes (Signed)
Anne Newton  MRN: [id number] DOB: 02/24/1990  PCP: Foye Spurling, MD  Subjective:  Pt is a 27 year old female presents to clinic for questions about birth control.  She is married. "We do not get along" She has intercourse with more than one sexual partner. Uses condoms most of the time.   LMP April 27. Historically, periods have been regular. Denies spotting, n/v, abdominal pain, abnormal cramping, back pain, fever, chills. She does not want STD screening today. She is asymptomatic. Tested negative about 2 months ago.   No hx of pregnancies. Never been on birth control.  She had chlamydia 2 years ago.  She would like to get pregnant next summer because she wants to give birth in 2021 "I like that number".   Last PAP 01/29/2017 - negative.   She believes she has depression. "I am sad all the time" Does not want to go outside. Feels tired a lot of the time.  She has no interest in doing things that made her happy before.  She works as a Scientist, forensic.  Her family situation makes her very sad. Does not have a good relationship with her husband. "we do not get along".  Denies SI/HI.   Review of Systems  Gastrointestinal: Negative for abdominal pain, nausea and vomiting.  Genitourinary: Positive for menstrual problem (missed period). Negative for difficulty urinating, dyspareunia, dysuria, enuresis, flank pain, frequency, hematuria, urgency, vaginal bleeding, vaginal discharge and vaginal pain.  Musculoskeletal: Negative for back pain.  Psychiatric/Behavioral: Positive for dysphoric mood. Negative for self-injury and suicidal ideas. The patient is not nervous/anxious.     Patient Active Problem List   Diagnosis Date Noted  . Hepatitis B immune 12/15/2014  . Thyroid cancer ([place]) 12/16/2013  . Hypothyroidism 12/16/2013  . Unspecified asthma(493.90) 07/03/2012  . Enlargement of lymph nodes 07/03/2012    Current Outpatient Medications on File Prior to Visit  Medication Sig  Dispense Refill  . albuterol (PROVENTIL HFA;VENTOLIN HFA) 108 (90 Base) MCG/ACT inhaler Inhale 2 puffs into the lungs every 6 (six) hours as needed for wheezing. 1 Inhaler 1  . levothyroxine (SYNTHROID, LEVOTHROID) 88 MCG tablet Take 1 tablet (88 mcg total) by mouth daily before breakfast. 90 tablet 1  . HYDROcodone-acetaminophen (NORCO/VICODIN) 5-325 MG tablet Take 1 tablet by mouth every 4 (four) hours as needed for moderate pain. (Patient not taking: Reported on 07/04/2017) 12 tablet 0   No current facility-administered medications on file prior to visit.     Allergies  Allergen Reactions  . Chicken Allergy Shortness Of Breath    EXACERBATES ASTHMA  . Shrimp [Shellfish Allergy] Shortness Of Breath    EXACERBATES ASTHMA     Objective:  BP 100/70 (BP Location: Left Arm, Patient Position: Sitting, Cuff Size: Normal)   Pulse 72   Temp 98.5 F (36.9 C) (Oral)   Ht 5\' 1"  (1.549 m)   Wt 119 lb 6.4 oz (54.2 kg)   SpO2 99%   BMI 22.56 kg/m   Physical Exam  Constitutional: She is oriented to person, place, and time. No distress.  Cardiovascular: Normal rate, regular rhythm and normal heart sounds.  Abdominal: There is no tenderness. There is no CVA tenderness.  Neurological: She is alert and oriented to person, place, and time.  Skin: Skin is warm and dry.  Psychiatric: Judgment normal.  Vitals reviewed.   Depression screen [facility] 2/9 07/04/2017 01/29/2017 11/15/2015 01/25/2015 01/19/2015  Decreased Interest 3 2 0 0 0  Down, Depressed, Hopeless 2 3  0 - 0  PHQ - 2 Score 5 5 0 0 0  Altered sleeping 1 0 - 0 -  Tired, decreased energy 2 2 - 3 -  Change in appetite 0 0 - 0 -  Feeling bad or failure about yourself  3 0 - 1 -  Trouble concentrating 3 0 - 0 -  Moving slowly or fidgety/restless 0 0 - 0 -  Suicidal thoughts 0 0 - 2 -  PHQ-9 Score 14 7 - 6 -  Difficult doing work/chores Extremely dIfficult Not difficult at all - Very difficult -    Results for orders placed or performed in  visit on 07/04/17  POCT urine pregnancy  Result Value Ref Range   Preg Test, Ur Negative Negative    Assessment and Plan :  1. Missed period - Pt presents for counseling regarding birth control. She is interested in becoming pregnant next summer. Urine hcg is negative today. She is unsure which birth control method she would like to start. I advised pt think about options, use condome with every sexual encounter. Discussed STD prevention vs pregnancy prevention. RTC in one month.  - POCT urine pregnancy  2. Need for HPV vaccination - Administered by CMA.  - HPV 9-valent vaccine,Recombinat  3. Encounter for initial prescription of contraceptive pills - levonorgestrel-ethinyl estradiol (AVIANE) 0.1-20 MG-MCG tablet; Take 1 tablet by mouth daily.  Dispense: 1 Package; Refill: 11  4. Dysphoric mood - Pt is asking for medication for depression. PHQ-9 score is 14. Denies SI or HI. When talking about options she does not wan to take a pill every day "why do I have to do that". Pharmacokinetics discussed. Advised and encouraged pt to speak with a therapist. Numbers printed out for pt. RTC in one month.    Mercer Pod, PA-C  [facility] [place] 07/04/2017 2:11 PM

## 2017-07-04 NOTE — Patient Instructions (Addendum)
  Go to [facility] to see the different birth control options.  Come back and see me in 3-4 weeks so we can start you on a birth control.  Use a condom every time you have sex to prevent sexually transmitted diseases.    Please call one of the following therapist offices to schedule an appointment to speak with a therapist:   [facility] [address] [place], [place], [place], [place] [address] Phone: [phone]  [facility]  [address], [place], [place] [address]  Phone:([phone]) [facility] [address] [place],  [address]  [phone]     Thank you for coming in today. I hope you feel we met your needs.  Feel free to call PCP if you have any questions or further requests.  Please consider signing up for MyChart if you do not already have it, as this is a great way to communicate with me.  Best,  Whitney McVey, PA-C    IF you received an x-ray today, you will receive an invoice from [facility]. Please contact [facility] at [phone] with questions or concerns regarding your invoice.   IF you received labwork today, you will receive an invoice from [facility]. Please contact [facility] at [phone] with questions or concerns regarding your invoice.   Our billing staff will not be able to assist you with questions regarding bills from these companies.  You will be contacted with the lab results as soon as they are available. The fastest way to get your results is to activate your My Chart account. Instructions are located on the last page of this paperwork. If you have not heard from [place] regarding the results in 2 weeks, please contact this office.

## 2017-07-28 ENCOUNTER — Ambulatory Visit: Payer: BLUE CROSS/BLUE SHIELD

## 2017-07-30 ENCOUNTER — Ambulatory Visit: Payer: BLUE CROSS/BLUE SHIELD | Admitting: Physician Assistant

## 2017-07-30 ENCOUNTER — Encounter: Payer: Self-pay | Admitting: Physician Assistant

## 2017-07-30 VITALS — BP 84/50 | HR 73 | Temp 98.2°F | Ht 61.0 in | Wt 117.0 lb

## 2017-07-30 DIAGNOSIS — F32A Depression, unspecified: Secondary | ICD-10-CM

## 2017-07-30 DIAGNOSIS — F329 Major depressive disorder, single episode, unspecified: Secondary | ICD-10-CM

## 2017-07-30 MED ORDER — FLUOXETINE HCL 20 MG PO TABS: 20.0000 mg | ORAL_TABLET | Freq: Every day | ORAL | 3 refills | Status: DC
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
## 2017-07-30 NOTE — Patient Instructions (Addendum)
Start taking Prozac 58m daily. If you are not feeling better after 3 weeks, take 428m(two pills) daily.  Come back and see me in 4-6 weeks to check in.   Please call one of the following therapist offices to schedule an appointment to speak with a therapist:  [facility]v[place] [address]hone: [phone] City[address], GrMassanetta Springs[place] [phone] Grant StreetGrCoconut CreekNC [phone] Go to [facility] website to see the different birth control options.   Use a condom every time you have sex to prevent sexually transmitted diseases.   Fluoxetine capsules or tablets (Depression/Mood Disorders) What is this medicine? FLUOXETINE (floo OX e teen) belongs to a class of drugs known as selective serotonin reuptake inhibitors (SSRIs). It helps to treat mood problems such as depression, obsessive compulsive disorder, and panic attacks. It can also treat certain eating disorders. This medicine may be used for other purposes; ask your health care provider or pharmacist if you have questions. COMMON BRAND NAME(S): Prozac What should I tell my health care provider before I take this medicine? They need to know if you have any of these conditions: -bipolar disorder or a family history of bipolar disorder -bleeding disorders -glaucoma -heart disease -liver disease -low levels of sodium in the blood -seizures -suicidal thoughts, plans, or attempt; a previous suicide attempt by you or a family member -take MAOIs like Carbex, Eldepryl, Marplan, Nardil, and Parnate -take medicines that treat or prevent blood clots -thyroid disease -an unusual or allergic reaction to fluoxetine, other medicines, foods, dyes, or preservatives -pregnant or trying to get pregnant -breast-feeding How should I use this  medicine? Take this medicine by mouth with a glass of water. Follow the directions on the prescription label. You can take this medicine with or without food. Take your medicine at regular intervals. Do not take it more often than directed. Do not stop taking this medicine suddenly except upon the advice of your doctor. Stopping this medicine too quickly may cause serious side effects or your condition may worsen. A special MedGuide will be given to you by the pharmacist with each prescription and refill. Be sure to read this information carefully each time. Talk to your pediatrician regarding the use of this medicine in children. While this drug may be prescribed for children as young as 7 years for selected conditions, precautions do apply. Overdosage: If you think you have taken too much of this medicine contact a poison control center or emergency room at once. NOTE: This medicine is only for you. Do not share this medicine with others. What if I miss a dose? If you miss a dose, skip the missed dose and go back to your regular dosing schedule. Do not take double or extra doses. What may interact with this medicine? Do not take this medicine with any of the following medications: -other medicines containing fluoxetine, like Sarafem or Symbyax -cisapride -linezolid -MAOIs like Carbex, Eldepryl, Marplan, Nardil, and Parnate -methylene blue (injected into a vein) -pimozide -thioridazine This medicine may also interact with the following medications: -alcohol -amphetamines -aspirin and aspirin-like medicines -carbamazepine -certain medicines for depression, anxiety, or psychotic disturbances -certain medicines for migraine headaches like almotriptan, eletriptan, frovatriptan, naratriptan, rizatriptan, sumatriptan, zolmitriptan -digoxin -diuretics -fentanyl -flecainide -furazolidone -isoniazid -lithium -medicines for sleep -medicines that treat or prevent blood clots like warfarin,  enoxaparin, and dalteparin -NSAIDs, medicines for pain and inflammation, like ibuprofen or naproxen -phenytoin -procarbazine -propafenone -rasagiline -ritonavir -supplements like St. John's wort, kava kava, valerian -tramadol -tryptophan -vinblastine This list may not describe all possible interactions. Give your health care provider a list of all the medicines, herbs, non-prescription drugs, or dietary supplements you use. Also tell them if you smoke, drink alcohol, or use illegal drugs. Some items may interact with your medicine. What should I watch for while using this medicine? Tell your doctor if your symptoms do not get better or if they get worse. Visit your doctor or health care professional for regular checks on your progress. Because it may take several weeks to see the full effects of this medicine, it is important to continue your treatment as prescribed by your doctor. Patients and their families should watch out for new or worsening thoughts of suicide or depression. Also watch out for sudden changes in feelings such as feeling anxious, agitated, panicky, irritable, hostile, aggressive, impulsive, severely restless, overly excited and hyperactive, or not being able to sleep. If this happens, especially at the beginning of treatment or after a change in dose, call your health care professional. Dennis Bast may get drowsy or dizzy. Do not drive, use machinery, or do anything that needs mental alertness until you know how this medicine affects you. Do not stand or sit up quickly, especially if you are an older patient. This reduces the risk of dizzy or fainting spells. Alcohol may interfere with the effect of this medicine. Avoid alcoholic drinks. Your mouth may get dry. Chewing sugarless gum or sucking hard candy, and drinking plenty of water may help. Contact your doctor if the problem does not go away or is severe. This medicine may affect blood sugar levels. If you have diabetes, check with  your doctor or health care professional before you change your diet or the dose of your diabetic medicine. What side effects may I notice from receiving this medicine? Side effects that you should report to your doctor or health care professional as soon as possible: -allergic reactions like skin rash, itching or hives, swelling of the face, lips, or tongue -anxious -black, tarry stools -breathing problems -changes in vision -confusion -elevated mood, decreased need for sleep, racing thoughts, impulsive behavior -eye pain -fast, irregular heartbeat -feeling faint or lightheaded, falls -feeling agitated, angry, or irritable -hallucination, loss of contact with reality -loss of balance or coordination -loss of memory -painful or prolonged erections -restlessness, pacing, inability to keep still -seizures -stiff muscles -suicidal thoughts or other mood changes -trouble sleeping -unusual bleeding or bruising -unusually weak or tired -vomiting Side effects that usually do not require medical attention (report to your doctor or health care professional if they continue or are bothersome): -change in appetite or weight -change in sex drive or performance -diarrhea -dry mouth -headache -increased sweating -nausea -tremors This list may not describe all possible side effects. Call your doctor for medical advice about side effects. You may report side effects to FDA at 1-800-FDA-1088. Where should I keep my medicine? Keep out of the reach of children. Store at room temperature between 15 and 30 degrees C (59 and 86 degrees F). Throw away any unused medicine after the expiration date. NOTE: This sheet is a summary. It may not cover all possible information. If you have questions about this medicine, talk to your doctor, pharmacist, or health care provider.  2018 Elsevier/Gold Standard (2015-06-12 15:55:27)  Thank you for coming in today. I hope  you feel we met your needs.  Feel free  to call PCP if you have any questions or further requests.  Please consider signing up for MyChart if you do not already have it, as this is a great way to communicate with me.  Best,  Whitney McVey, PA-C  IF you received an x-ray today, you will receive an invoice from [facility]. Please contact [facility] at [phone] with questions or concerns regarding your invoice.   IF you received labwork today, you will receive an invoice from [facility]. Please contact [facility] at [phone] with questions or concerns regarding your invoice.   Our billing staff will not be able to assist you with questions regarding bills from these companies.  You will be contacted with the lab results as soon as they are available. The fastest way to get your results is to activate your My Chart account. Instructions are located on the last page of this paperwork. If you have not heard from [place] regarding the results in 2 weeks, please contact this office.

## 2017-09-04 ENCOUNTER — Ambulatory Visit: Payer: BLUE CROSS/BLUE SHIELD | Admitting: Physician Assistant

## 2017-09-14 ENCOUNTER — Telehealth: Payer: Self-pay | Admitting: Physician Assistant

## 2017-09-14 NOTE — Telephone Encounter (Signed)
Called patient in regards to her appt she has with Windell Hummingbird on 12/18/2017. The provider is leaving our office and needs to be rescheduled/cancelled. Did not give permission to leave a VM on cell phone, and home phone didn't have a VM set up.

## 2017-11-16 ENCOUNTER — Ambulatory Visit: Payer: Self-pay | Admitting: Physician Assistant

## 2017-11-16 ENCOUNTER — Encounter: Payer: Self-pay | Admitting: Physician Assistant

## 2017-11-16 ENCOUNTER — Other Ambulatory Visit: Payer: Self-pay

## 2017-11-16 VITALS — BP 106/72 | HR 83 | Temp 98.4°F | Resp 18 | Ht 61.0 in | Wt 114.8 lb

## 2017-11-16 DIAGNOSIS — Z113 Encounter for screening for infections with a predominantly sexual mode of transmission: Secondary | ICD-10-CM

## 2017-11-16 DIAGNOSIS — K1370 Unspecified lesions of oral mucosa: Secondary | ICD-10-CM

## 2017-11-16 MED ORDER — MAGIC MOUTHWASH W/LIDOCAINE: 10.0000 mL | ORAL | 0 refills | Status: DC | PRN

## 2017-11-16 NOTE — Patient Instructions (Addendum)
Use mouthwash as directed. Eat soft foods. This should resolve within 2 weeks. Return to clinic if symptoms worsen, do not improve, or as needed.   If you have lab work done today you will be contacted with your lab results within the next 2 weeks.  If you have not heard from [place] then please contact us. The fastest way to get your results is to register for My Chart.   Canker Sores Canker sores are small, painful sores that develop inside your mouth. They may also be called aphthous ulcers. You can get canker sores on the inside of your lips or cheeks, on your tongue, or anywhere inside your mouth. You can have just one canker sore or several of them. Canker sores cannot be passed from one person to another (noncontagious). These sores are different than the sores that you may get on the outside of your lips (cold sores or fever blisters). Canker sores usually start as painful red bumps. Then they turn into small white, yellow, or gray ulcers that have red borders. The ulcers may be quite painful. The pain may be worse when you eat or drink. What are the causes? The cause of this condition is not known. What increases the risk? This condition is more likely to develop in:  Women.  People in their teens or 42s.  Women who are having their menstrual period.  People who are under a lot of emotional stress.  People who do not get enough iron or B vitamins.  People who have poor oral hygiene.  People who have an injury inside the mouth. This can happen after having dental work or from chewing something hard.  What are the signs or symptoms? Along with the canker sore, symptoms may also include:  Fever.  Fatigue.  Swollen lymph nodes in your neck.  How is this diagnosed? This condition can be diagnosed based on your symptoms. Your health care provider will also examine your mouth. Your health care provider may also do tests if you get canker sores often or if they are very bad. Tests  may include:  Blood tests to rule out other causes of canker sores.  Taking swabs from the sore to check for infection.  Taking a small piece of skin from the sore (biopsy) to test it for cancer.  How is this treated? Most canker sores clear up without treatment in about 10 days. Home care is usually the only treatment that you will need. Over-the-counter medicines can relieve discomfort.If you have severe canker sores, your health care provider may prescribe:  Numbing ointment to relieve pain.  Vitamins.  Steroid medicines. These may be given as: ? Oral pills. ? Mouth rinses. ? Gels.  Antibiotic mouth rinse.  Follow these instructions at home:  Apply, take, or use medicines only as directed by your health care provider. These include vitamins.  If you were prescribed an antibiotic mouth rinse, finish all of it even if you start to feel better.  Until the sores are healed: ? Do not drink coffee or citrus juices. ? Do not eat spicy or salty foods.  Use a mild, over-the-counter mouth rinse as directed by your health care provider.  Practice good oral hygiene. ? Floss your teeth every day. ? Brush your teeth with a soft brush twice each day. Contact a health care provider if:  Your symptoms do not get better after two weeks.  You also have a fever or swollen glands.  You get canker sores  often.  You have a canker sore that is getting larger.  You cannot eat or drink due to your canker sores. This information is not intended to replace advice given to you by your health care provider. Make sure you discuss any questions you have with your health care provider. Document Released: 05/06/2010 Document Revised: 06/17/2015 Document Reviewed: 12/10/2013 Elsevier Interactive Patient Education  2018 [employer].  IF you received an x-ray today, you will receive an invoice from [facility]. Please contact [facility] at [phone] with questions or  concerns regarding your invoice.   IF you received labwork today, you will receive an invoice from [place]. Please contact [facility] at [phone] with questions or concerns regarding your invoice.   Our billing staff will not be able to assist you with questions regarding bills from these companies.  You will be contacted with the lab results as soon as they are available. The fastest way to get your results is to activate your My Chart account. Instructions are located on the last page of this paperwork. If you have not heard from [place] regarding the results in 2 weeks, please contact this office.

## 2017-11-16 NOTE — Progress Notes (Signed)
MRN: [id number] DOB: Feb 02, 1990  Subjective:   Anne Newton is a 27 y.o. female presenting for chief complaint of Sore Throat (believes there is a blister or sore in throat x2days ) .  Reports 2 day history of sore throat, feels like a canker sore. Has tried advil with no relief.  Denies fever, sinus congestion, rhinorrhea, ear pain, wheezing, shortness of breath, chest tightness, chest pain and myalgia, night sweats, chills, fatigue, malaise, weight loss, nausea, vomiting, abdominal pain and diarrhea. Has been more stressed lately. Well balanced diet.  Has not had sick contact with anyone. Denies smoking. Recent new sexual partner. They do perform oral sexual intercourse, no known exposure to this type of lesion. No PMH of HSV or HIV. PMH of thyroid cancer.  Denies any other aggravating or relieving factors, no other questions or concerns.  Geraldyn has a current medication list which includes the following prescription(s): albuterol, levothyroxine, fluoxetine, and levonorgestrel-ethinyl estradiol. Also is allergic to chicken allergy and shrimp [shellfish allergy].  Kamdyn  has a past medical history of Asthma, Hypothyroidism, Nasal septal deviation (05/2017), and Nasal turbinate hypertrophy (05/2017). Also  has a past surgical history that includes Thyroidectomy (Left, 08/28/2012); Thyroidectomy (Right, 10/21/2012); Septoplasty (N/A, 06/30/2014); Turbinate reduction (N/A, 06/30/2014); Septoplasty (N/A, 06/26/2017); and Turbinate reduction (Bilateral, 06/26/2017).   Objective:   Vitals: BP 106/72   Pulse 83   Temp 98.4 F (36.9 C) (Oral)   Resp 18   Ht 5\' 1"  (1.549 m)   Wt 114 lb 12.8 oz (52.1 kg)   LMP 11/16/2017   SpO2 94%   BMI 21.69 kg/m   Physical Exam  Constitutional: She is oriented to person, place, and time. She appears well-developed and well-nourished.  HENT:  Head: Normocephalic and atraumatic.  Mouth/Throat: Uvula is midline and mucous membranes are normal. Oral lesions (One ~75mm  ulcerated lesion in oropharynx) present. No tonsillar abscesses. Tonsils are 1+ on the right. Tonsils are 1+ on the left. No tonsillar exudate.  Eyes: Conjunctivae are normal.  Neck: Normal range of motion.  Pulmonary/Chest: Effort normal.  Lymphadenopathy:       Head (right side): No submental, no submandibular, no tonsillar, no preauricular, no posterior auricular and no occipital adenopathy present.       Head (left side): No submental, no submandibular, no tonsillar, no preauricular, no posterior auricular and no occipital adenopathy present.    She has no cervical adenopathy.       Right: No supraclavicular adenopathy present.       Left: No supraclavicular adenopathy present.  Neurological: She is alert and oriented to person, place, and time.  Skin: Skin is warm and dry.  Psychiatric: She has a normal mood and affect.  Vitals reviewed.   No results found for this or any previous visit (from the past 24 hour(s)).  Assessment and Plan :  1. Oral lesiond Hx and appearance consistent with aphthous ulcer. Labs pending. Given sx tx. Advised pt to return to clinic if symptoms worsen, do not improve, or as needed.  - CBC with Differential/Platelet - HIV Antibody (routine testing w rflx) - RPR - Herpes simplex virus culture - magic mouthwash w/lidocaine SOLN; Take 10 mLs by mouth every 2 (two) hours as needed for mouth pain.  Dispense: 360 mL; Refill: 0  2. Screen for STD (sexually transmitted disease) - GC/Chlamydia Probe Amp - Hepatitis panel, acute - HIV Antibody (routine testing w rflx) - RPR - Trichomonas vaginalis, RNA   Tenna Delaine, PA-C  [facility] 11/16/2017 4:16 PM

## 2017-11-17 LAB — CBC WITH DIFFERENTIAL/PLATELET
BASOS ABS: 0 10*3/uL (ref 0.0–0.2)
Basos: 0 %
EOS (ABSOLUTE): 0.3 10*3/uL (ref 0.0–0.4)
Eos: 4 %
Hematocrit: 42.7 % (ref 34.0–46.6)
Hemoglobin: 13.9 g/dL (ref 11.1–15.9)
Immature Grans (Abs): 0 10*3/uL (ref 0.0–0.1)
Immature Granulocytes: 0 %
LYMPHS ABS: 1.6 10*3/uL (ref 0.7–3.1)
LYMPHS: 20 %
MCH: 28.3 pg (ref 26.6–33.0)
MCHC: 32.6 g/dL (ref 31.5–35.7)
MCV: 87 fL (ref 79–97)
MONOS ABS: 0.7 10*3/uL (ref 0.1–0.9)
Monocytes: 9 %
NEUTROS ABS: 5.1 10*3/uL (ref 1.4–7.0)
Neutrophils: 67 %
Platelets: 304 10*3/uL (ref 150–450)
RBC: 4.92 x10E6/uL (ref 3.77–5.28)
RDW: 12.8 % (ref 12.3–15.4)
WBC: 7.7 10*3/uL (ref 3.4–10.8)

## 2017-11-17 LAB — HEPATITIS PANEL, ACUTE
Hep A IgM: NEGATIVE
Hep B C IgM: NEGATIVE
Hep C Virus Ab: <0.1 s/co ratio (ref 0.0–0.9)
Hepatitis B Surface Ag: NEGATIVE

## 2017-11-17 LAB — HIV ANTIBODY (ROUTINE TESTING W REFLEX): HIV SCREEN 4TH GENERATION: NONREACTIVE

## 2017-11-17 LAB — RPR: RPR: NONREACTIVE

## 2017-11-18 LAB — GC/CHLAMYDIA PROBE AMP
CHLAMYDIA, DNA PROBE: NEGATIVE
Neisseria gonorrhoeae by PCR: NEGATIVE

## 2017-11-18 LAB — TRICHOMONAS VAGINALIS, PROBE AMP: Trich vag by NAA: NEGATIVE

## 2017-11-19 LAB — HERPES SIMPLEX VIRUS CULTURE

## 2017-12-09 ENCOUNTER — Emergency Department (HOSPITAL_COMMUNITY): Payer: Self-pay

## 2017-12-09 ENCOUNTER — Other Ambulatory Visit: Payer: Self-pay

## 2017-12-09 ENCOUNTER — Encounter (HOSPITAL_COMMUNITY): Payer: Self-pay | Admitting: Obstetrics and Gynecology

## 2017-12-09 ENCOUNTER — Inpatient Hospital Stay (HOSPITAL_COMMUNITY): Payer: Self-pay

## 2017-12-09 ENCOUNTER — Inpatient Hospital Stay (HOSPITAL_COMMUNITY)
Admission: EM | Admit: 2017-12-09 | Discharge: 2017-12-14 | DRG: 871 | Disposition: A | Discharge status: Home / Self Care | Payer: Self-pay | Attending: Internal Medicine | Admitting: Internal Medicine

## 2017-12-09 DIAGNOSIS — K750 Abscess of liver: Secondary | ICD-10-CM | POA: Diagnosis present

## 2017-12-09 DIAGNOSIS — J45909 Unspecified asthma, uncomplicated: Secondary | ICD-10-CM | POA: Diagnosis present

## 2017-12-09 DIAGNOSIS — R7989 Other specified abnormal findings of blood chemistry: Secondary | ICD-10-CM

## 2017-12-09 DIAGNOSIS — N739 Female pelvic inflammatory disease, unspecified: Secondary | ICD-10-CM | POA: Diagnosis present

## 2017-12-09 DIAGNOSIS — E890 Postprocedural hypothyroidism: Secondary | ICD-10-CM | POA: Diagnosis present

## 2017-12-09 DIAGNOSIS — E876 Hypokalemia: Secondary | ICD-10-CM

## 2017-12-09 DIAGNOSIS — K769 Liver disease, unspecified: Secondary | ICD-10-CM

## 2017-12-09 DIAGNOSIS — A419 Sepsis, unspecified organism: Principal | ICD-10-CM | POA: Diagnosis present

## 2017-12-09 DIAGNOSIS — B379 Candidiasis, unspecified: Secondary | ICD-10-CM

## 2017-12-09 DIAGNOSIS — N760 Acute vaginitis: Secondary | ICD-10-CM | POA: Diagnosis present

## 2017-12-09 DIAGNOSIS — R6521 Severe sepsis with septic shock: Secondary | ICD-10-CM | POA: Diagnosis present

## 2017-12-09 DIAGNOSIS — R945 Abnormal results of liver function studies: Secondary | ICD-10-CM

## 2017-12-09 DIAGNOSIS — Z85850 Personal history of malignant neoplasm of thyroid: Secondary | ICD-10-CM

## 2017-12-09 DIAGNOSIS — E039 Hypothyroidism, unspecified: Secondary | ICD-10-CM | POA: Diagnosis present

## 2017-12-09 DIAGNOSIS — Z79899 Other long term (current) drug therapy: Secondary | ICD-10-CM

## 2017-12-09 DIAGNOSIS — D649 Anemia, unspecified: Secondary | ICD-10-CM | POA: Diagnosis present

## 2017-12-09 DIAGNOSIS — Z79890 Hormone replacement therapy (postmenopausal): Secondary | ICD-10-CM

## 2017-12-09 LAB — CBC WITH DIFFERENTIAL/PLATELET
Abs Immature Granulocytes: 0.06 10*3/uL (ref 0.00–0.07)
BASOS ABS: 0 10*3/uL (ref 0.0–0.1)
BASOS PCT: 0 %
EOS PCT: 0 %
Eosinophils Absolute: 0 10*3/uL (ref 0.0–0.5)
HCT: 45.2 % (ref 36.0–46.0)
HEMOGLOBIN: 13.8 g/dL (ref 12.0–15.0)
Immature Granulocytes: 1 %
LYMPHS ABS: 0.5 10*3/uL — AB (ref 0.7–4.0)
Lymphocytes Relative: 6 %
MCH: 27.1 pg (ref 26.0–34.0)
MCHC: 30.5 g/dL (ref 30.0–36.0)
MCV: 88.8 fL (ref 80.0–100.0)
MONOS PCT: 0 %
Monocytes Absolute: 0 10*3/uL — ABNORMAL LOW (ref 0.1–1.0)
NEUTROS ABS: 8 10*3/uL — AB (ref 1.7–7.7)
NEUTROS PCT: 93 %
PLATELETS: 280 10*3/uL (ref 150–400)
RBC: 5.09 MIL/uL (ref 3.87–5.11)
RDW: 12.7 % (ref 11.5–15.5)
WBC: 8.6 10*3/uL (ref 4.0–10.5)
nRBC: 0 % (ref 0.0–0.2)

## 2017-12-09 LAB — URINALYSIS, ROUTINE W REFLEX MICROSCOPIC
Bilirubin Urine: NEGATIVE
GLUCOSE, UA: NEGATIVE mg/dL
Hgb urine dipstick: NEGATIVE
Ketones, ur: NEGATIVE mg/dL
Leukocytes, UA: NEGATIVE
NITRITE: NEGATIVE
PH: 6 (ref 5.0–8.0)
Protein, ur: NEGATIVE mg/dL
SPECIFIC GRAVITY, URINE: 1.005 (ref 1.005–1.030)

## 2017-12-09 LAB — MONONUCLEOSIS SCREEN: MONO SCREEN: NEGATIVE

## 2017-12-09 LAB — CG4 I-STAT (LACTIC ACID)
LACTIC ACID, VENOUS: 4.64 mmol/L — AB (ref 0.5–1.9)
Lactic Acid, Venous: 1.14 mmol/L (ref 0.5–1.9)

## 2017-12-09 LAB — COMPREHENSIVE METABOLIC PANEL
ALK PHOS: 104 U/L (ref 38–126)
ALT: 50 U/L — ABNORMAL HIGH (ref 0–44)
ANION GAP: 14 (ref 5–15)
AST: 77 U/L — ABNORMAL HIGH (ref 15–41)
Albumin: 4.3 g/dL (ref 3.5–5.0)
BILIRUBIN TOTAL: 1.6 mg/dL — AB (ref 0.3–1.2)
BUN: 18 mg/dL (ref 6–20)
CALCIUM: 9.4 mg/dL (ref 8.9–10.3)
CO2: 24 mmol/L (ref 22–32)
Chloride: 103 mmol/L (ref 98–111)
Creatinine, Ser: 0.9 mg/dL (ref 0.44–1.00)
GFR calc Af Amer: >60 mL/min (ref >60)
GFR calc non Af Amer: >60 mL/min (ref >60)
Glucose, Bld: 113 mg/dL — ABNORMAL HIGH (ref 70–99)
POTASSIUM: 3.3 mmol/L — AB (ref 3.5–5.1)
Sodium: 141 mmol/L (ref 135–145)
Total Protein: 8.4 g/dL — ABNORMAL HIGH (ref 6.5–8.1)

## 2017-12-09 LAB — I-STAT BETA HCG BLOOD, ED (NOT ORDERABLE)

## 2017-12-09 LAB — WET PREP, GENITAL
SPERM: NONE SEEN
TRICH WET PREP: NONE SEEN

## 2017-12-09 LAB — INFLUENZA PANEL BY PCR (TYPE A & B)
INFLAPCR: NEGATIVE
INFLBPCR: NEGATIVE

## 2017-12-09 LAB — GROUP A STREP BY PCR: Group A Strep by PCR: NOT DETECTED

## 2017-12-09 LAB — PROTIME-INR
INR: 1.05
PROTHROMBIN TIME: 13.6 s (ref 11.4–15.2)

## 2017-12-09 MED ORDER — ACETAMINOPHEN 325 MG PO TABS
650.0000 mg | ORAL_TABLET | Freq: Once | ORAL | Status: AC
  Administered 2017-12-09: 650 mg via ORAL
  Filled 2017-12-09: qty 2

## 2017-12-09 MED ORDER — SODIUM CHLORIDE 0.9 % IV BOLUS (SEPSIS)
500.0000 mL | Freq: Once | INTRAVENOUS | Status: AC
  Administered 2017-12-09: 500 mL via INTRAVENOUS

## 2017-12-09 MED ORDER — POTASSIUM CHLORIDE CRYS ER 20 MEQ PO TBCR
40.0000 meq | EXTENDED_RELEASE_TABLET | Freq: Once | ORAL | Status: AC
  Administered 2017-12-09: 40 meq via ORAL
  Filled 2017-12-09: qty 2

## 2017-12-09 MED ORDER — SODIUM CHLORIDE 0.9 % IV BOLUS (SEPSIS)
250.0000 mL | Freq: Once | INTRAVENOUS | Status: AC
  Administered 2017-12-09: 250 mL via INTRAVENOUS

## 2017-12-09 MED ORDER — SODIUM CHLORIDE 0.9 % IV BOLUS (SEPSIS)
1000.0000 mL | Freq: Once | INTRAVENOUS | Status: AC
  Administered 2017-12-09: 1000 mL via INTRAVENOUS

## 2017-12-09 MED ORDER — IOHEXOL 300 MG/ML  SOLN: 30.0000 mL | Freq: Once | INTRAMUSCULAR | Status: DC | PRN

## 2017-12-09 MED ORDER — IBUPROFEN 200 MG PO TABS
600.0000 mg | ORAL_TABLET | Freq: Once | ORAL | Status: AC
  Administered 2017-12-09: 600 mg via ORAL
  Filled 2017-12-09: qty 3

## 2017-12-09 MED ORDER — VANCOMYCIN HCL IN DEXTROSE 1-5 GM/200ML-% IV SOLN
1000.0000 mg | Freq: Once | INTRAVENOUS | Status: AC
  Administered 2017-12-09: 1000 mg via INTRAVENOUS
  Filled 2017-12-09: qty 200

## 2017-12-09 MED ORDER — METRONIDAZOLE IN NACL 5-0.79 MG/ML-% IV SOLN
500.0000 mg | Freq: Three times a day (TID) | INTRAVENOUS | Status: DC
  Administered 2017-12-09 – 2017-12-14 (×16): 500 mg via INTRAVENOUS
  Filled 2017-12-09 (×15): qty 100

## 2017-12-09 MED ORDER — SODIUM CHLORIDE 0.9 % IV SOLN
2.0000 g | Freq: Once | INTRAVENOUS | Status: AC
  Administered 2017-12-09: 2 g via INTRAVENOUS
  Filled 2017-12-09: qty 2

## 2017-12-09 MED ORDER — DIPHENHYDRAMINE HCL 50 MG/ML IJ SOLN
25.0000 mg | Freq: Once | INTRAMUSCULAR | Status: AC
  Administered 2017-12-09: 25 mg via INTRAVENOUS
  Filled 2017-12-09: qty 1

## 2017-12-09 MED ORDER — SODIUM CHLORIDE 0.9 % IV BOLUS
1000.0000 mL | Freq: Once | INTRAVENOUS | Status: AC
  Administered 2017-12-09: 1000 mL via INTRAVENOUS

## 2017-12-09 NOTE — Progress Notes (Signed)
A consult was received from an ED physician for vancomycin and cefepime per pharmacy dosing.  The patient's profile has been reviewed for ht/wt/allergies/indication/available labs.   A one time order has been placed for vancomycin 1gm and cefepime 2gm.   Further antibiotics/pharmacy consults should be ordered by admitting physician if indicated.                       Thank you, Dolly Rias RPh 12/09/2017, 5:04 PM Pager [phone]

## 2017-12-09 NOTE — ED Notes (Signed)
Patient called out for rash on right side of neck. Stopped vancomycin and made [place], [place] aware. Orders for benadryl.

## 2017-12-09 NOTE — ED Notes (Signed)
Patient ambulated to bathroom with minimal assistance. Explained to patient that she would be signing out AMA if she wanted to leave and she has agreed to stay to receive treatment. Will call floor for handoff report.

## 2017-12-09 NOTE — ED Notes (Signed)
EDP [facility] notified of POC Latic of 4.64

## 2017-12-09 NOTE — ED Triage Notes (Signed)
Pt reports flu like URI symptoms, vaginal pain, and fever. Pt reports she washed her vagina and has still having pain.

## 2017-12-09 NOTE — ED Notes (Signed)
Patient transported to X-ray 

## 2017-12-09 NOTE — ED Notes (Signed)
ED TO INPATIENT HANDOFF REPORT  Name/Age/Gender Anne Newton 27 y.o. female  Code Status   Home/SNF/Other Home  Chief Complaint Flu Like Symptoms   Level of Care/Admitting Diagnosis ED Disposition    ED Disposition Condition Comment   Admit  Hospital Area: [facility] [[id number]]  Level of Care: Stepdown [14]  Admit to SDU based on following criteria: Severe physiological/psychological symptoms:  Any diagnosis requiring assessment & intervention at least every 4 hours on an ongoing basis to obtain desired patient outcomes including stability and rehabilitation  Diagnosis: Sepsis [facility]) [[id number]]  Admitting Physician: Rise Patience [phone]  Attending Physician: Rise Patience [phone]  Estimated length of stay: past midnight tomorrow  Certification:: I certify this patient will need inpatient services for at least 2 midnights  PT Class (Do Not Modify): Inpatient [101]  PT Acc Code (Do Not Modify): Private [1]       Medical History Past Medical History:  Diagnosis Date  . Asthma    prn inhaler  . Hypothyroidism   . Nasal septal deviation 05/2017  . Nasal turbinate hypertrophy 05/2017    Allergies Allergies  Allergen Reactions  . Chicken Allergy Shortness Of Breath    EXACERBATES ASTHMA  . Shrimp [Shellfish Allergy] Shortness Of Breath    EXACERBATES ASTHMA    IV Location/Drains/Wounds Patient Lines/Drains/Airways Status   Active Line/Drains/Airways    Name:   Placement date:   Placement time:   Site:   Days:   Peripheral IV 12/09/17 Left Antecubital   12/09/17    1700    Antecubital   less than 1   Peripheral IV 12/09/17 Right Antecubital   12/09/17    1728    Antecubital   less than 1   Incision 08/28/12 Neck Other (Comment)   08/28/12    1444     1929   Incision 10/21/12 Neck Right   10/21/12    1152     1875   Incision (Closed) 06/30/14 Nose Bilateral   06/30/14    0857     1258   Incision (Closed) 06/26/17 Nose Other (Comment)    06/26/17    0936     166          Labs/Imaging Results for orders placed or performed during the hospital encounter of 12/09/17 (from the past 48 hour(s))  Comprehensive metabolic panel     Status: Abnormal   Collection Time: 12/09/17  4:49 PM  Result Value Ref Range   Sodium 141 135 - 145 mmol/L   Potassium 3.3 (L) 3.5 - 5.1 mmol/L   Chloride 103 98 - 111 mmol/L   CO2 24 22 - 32 mmol/L   Glucose, Bld 113 (H) 70 - 99 mg/dL   BUN 18 6 - 20 mg/dL   Creatinine, Ser 0.90 0.44 - 1.00 mg/dL   Calcium 9.4 8.9 - 10.3 mg/dL   Total Protein 8.4 (H) 6.5 - 8.1 g/dL   Albumin 4.3 3.5 - 5.0 g/dL   AST 77 (H) 15 - 41 U/L   ALT 50 (H) 0 - 44 U/L   Alkaline Phosphatase 104 38 - 126 U/L   Total Bilirubin 1.6 (H) 0.3 - 1.2 mg/dL   GFR calc non Af Amer >60 >60 mL/min   GFR calc Af Amer >60 >60 mL/min    Comment: (NOTE) The eGFR has been calculated using the CKD EPI equation. This calculation has not been validated in all clinical situations. eGFR's persistently <60 mL/min signify possible  Chronic Kidney Disease.    Anion gap 14 5 - 15    Comment: Performed at [facility], [place] [address], [place], [place] [address]  CBC with Differential     Status: Abnormal   Collection Time: 12/09/17  4:49 PM  Result Value Ref Range   WBC 8.6 4.0 - 10.5 K/uL   RBC 5.09 3.87 - 5.11 MIL/uL   Hemoglobin 13.8 12.0 - 15.0 g/dL   HCT 45.2 36.0 - 46.0 %   MCV 88.8 80.0 - 100.0 fL   MCH 27.1 26.0 - 34.0 pg   MCHC 30.5 30.0 - 36.0 g/dL   RDW 12.7 11.5 - 15.5 %   Platelets 280 150 - 400 K/uL   nRBC 0.0 0.0 - 0.2 %   Neutrophils Relative % 93 %   Neutro Abs 8.0 (H) 1.7 - 7.7 K/uL   Lymphocytes Relative 6 %   Lymphs Abs 0.5 (L) 0.7 - 4.0 K/uL   Monocytes Relative 0 %   Monocytes Absolute 0.0 (L) 0.1 - 1.0 K/uL   Eosinophils Relative 0 %   Eosinophils Absolute 0.0 0.0 - 0.5 K/uL   Basophils Relative 0 %   Basophils Absolute 0.0 0.0 - 0.1 K/uL   Immature Granulocytes 1 %   Abs Immature  Granulocytes 0.06 0.00 - 0.07 K/uL    Comment: Performed at [facility], [place] [address], [place], [place] [address]  Protime-INR     Status: None   Collection Time: 12/09/17  4:49 PM  Result Value Ref Range   Prothrombin Time 13.6 11.4 - 15.2 seconds   INR 1.05     Comment: Performed at [facility], [place] [address] [place], [place] [address]  I-Stat beta hCG blood, ED     Status: None   Collection Time: 12/09/17  4:55 PM  Result Value Ref Range   I-stat hCG, quantitative <5.0 <5 mIU/mL   Comment 3            Comment:   GEST. AGE      CONC.  (mIU/mL)   <=1 WEEK        5 - 50     2 WEEKS       50 - 500     3 WEEKS       100 - 10,000     4 WEEKS     1,000 - 30,000        FEMALE AND NON-PREGNANT FEMALE:     LESS THAN 5 mIU/mL   CG4 I-STAT (Lactic acid)     Status: Abnormal   Collection Time: 12/09/17  4:56 PM  Result Value Ref Range   Lactic Acid, Venous 4.64 (HH) 0.5 - 1.9 mmol/L   Comment NOTIFIED PHYSICIAN   Group A Strep by PCR     Status: None   Collection Time: 12/09/17  4:58 PM  Result Value Ref Range   Group A Strep by PCR NOT DETECTED NOT DETECTED    Comment: Performed at [facility], [place] [address], [place], [place] [address]  Mononucleosis screen     Status: None   Collection Time: 12/09/17  4:58 PM  Result Value Ref Range   Mono Screen NEGATIVE NEGATIVE    Comment: Performed at [facility], [place] [address] [place], [place] [address]  Influenza panel by PCR (type A & B)     Status: None   Collection Time: 12/09/17  4:58 PM  Result Value  Ref Range   Influenza A By PCR NEGATIVE NEGATIVE   Influenza B By PCR NEGATIVE NEGATIVE    Comment: (NOTE) The Xpert Xpress Flu assay is intended as an aid in the diagnosis of  influenza and should not be used as a sole basis for treatment.  This  assay is FDA approved for nasopharyngeal swab specimens only. Nasal  washings and aspirates are unacceptable  for Xpert Xpress Flu testing. Performed at [facility], [place] [address] [place], [place] [address]   Wet prep, genital     Status: Abnormal   Collection Time: 12/09/17  5:11 PM  Result Value Ref Range   Yeast Wet Prep HPF POC FEW (A) NONE SEEN   Trich, Wet Prep NONE SEEN NONE SEEN   Clue Cells Wet Prep HPF POC PRESENT (A) NONE SEEN   WBC, Wet Prep HPF POC MANY (A) NONE SEEN   Sperm NONE SEEN     Comment: Performed at [facility], [place] [address], [place], [place] [address]  CG4 I-STAT (Lactic acid)     Status: None   Collection Time: 12/09/17  8:53 PM  Result Value Ref Range   Lactic Acid, Venous 1.14 0.5 - 1.9 mmol/L  Urinalysis, Routine w reflex microscopic     Status: None   Collection Time: 12/09/17  9:00 PM  Result Value Ref Range   Color, Urine YELLOW YELLOW   APPearance CLEAR CLEAR   Specific Gravity, Urine 1.005 1.005 - 1.030   pH 6.0 5.0 - 8.0   Glucose, UA NEGATIVE NEGATIVE mg/dL   Hgb urine dipstick NEGATIVE NEGATIVE   Bilirubin Urine NEGATIVE NEGATIVE   Ketones, ur NEGATIVE NEGATIVE mg/dL   Protein, ur NEGATIVE NEGATIVE mg/dL   Nitrite NEGATIVE NEGATIVE   Leukocytes, UA NEGATIVE NEGATIVE    Comment: Performed at [place] [address], [place], [place] [address]   Dg Chest 2 View  Result Date: 12/09/2017 CLINICAL DATA:  Possible sepsis EXAM: CHEST - 2 VIEW COMPARISON:  10/16/2012 FINDINGS: The heart size and mediastinal contours are within normal limits. Both lungs are clear. The visualized skeletal structures are unremarkable. IMPRESSION: No active cardiopulmonary disease. Electronically Signed   By: Donavan Foil M.D.   On: 12/09/2017 18:19   US Transvaginal Non-ob  Result Date: 12/09/2017 CLINICAL DATA:  Pelvic pain EXAM: TRANSABDOMINAL AND TRANSVAGINAL ULTRASOUND OF PELVIS DOPPLER ULTRASOUND OF OVARIES TECHNIQUE: Both transabdominal and transvaginal ultrasound examinations of the pelvis were  performed. Transabdominal technique was performed for global imaging of the pelvis including uterus, ovaries, adnexal regions, and pelvic cul-de-sac. It was necessary to proceed with endovaginal exam following the transabdominal exam to visualize the endometrium and ovaries. Color and duplex Doppler ultrasound was utilized to evaluate blood flow to the ovaries. COMPARISON:  None. FINDINGS: Uterus Measurements: 8.5 x 4.3 x 5.0 cm = volume: 96 mL. No fibroids or other mass visualized. Endometrium Thickness: 9.  No focal abnormality visualized. Right ovary Measurements: 2.9 x 1.8 x 3.3 cm = volume: 9 mL. Normal appearance/no adnexal mass. Left ovary Measurements: 3.7 x 2.7 x 3.2 cm = volume: 16.7 mL. Normal appearance/no adnexal mass. Pulsed Doppler evaluation of both ovaries demonstrates normal low-resistance arterial and venous waveforms. Other findings Trace free fluid noted in the cul-de-sac. IMPRESSION: Unremarkable study. No findings to explain the patient's history of pain. Electronically Signed   By: Misty Stanley M.D.   On: 12/09/2017 20:04   US Pelvis Complete  Result Date: 12/09/2017 CLINICAL DATA:  Pelvic pain EXAM: TRANSABDOMINAL AND TRANSVAGINAL ULTRASOUND OF PELVIS DOPPLER ULTRASOUND OF OVARIES TECHNIQUE: Both transabdominal and transvaginal ultrasound examinations of the pelvis were performed. Transabdominal technique was performed for global imaging of the pelvis including uterus, ovaries, adnexal regions, and pelvic cul-de-sac. It was necessary to proceed with endovaginal exam following the transabdominal exam to visualize the endometrium and ovaries. Color and duplex Doppler ultrasound was utilized to evaluate blood flow to the ovaries. COMPARISON:  None. FINDINGS: Uterus Measurements: 8.5 x 4.3 x 5.0 cm = volume: 96 mL. No fibroids or other mass visualized. Endometrium Thickness: 9.  No focal abnormality visualized. Right ovary Measurements: 2.9 x 1.8 x 3.3 cm = volume: 9 mL. Normal  appearance/no adnexal mass. Left ovary Measurements: 3.7 x 2.7 x 3.2 cm = volume: 16.7 mL. Normal appearance/no adnexal mass. Pulsed Doppler evaluation of both ovaries demonstrates normal low-resistance arterial and venous waveforms. Other findings Trace free fluid noted in the cul-de-sac. IMPRESSION: Unremarkable study. No findings to explain the patient's history of pain. Electronically Signed   By: Misty Stanley M.D.   On: 12/09/2017 20:04   [place] Art/ven Flow Abd Pelv Doppler  Result Date: 12/09/2017 CLINICAL DATA:  Pelvic pain EXAM: TRANSABDOMINAL AND TRANSVAGINAL ULTRASOUND OF PELVIS DOPPLER ULTRASOUND OF OVARIES TECHNIQUE: Both transabdominal and transvaginal ultrasound examinations of the pelvis were performed. Transabdominal technique was performed for global imaging of the pelvis including uterus, ovaries, adnexal regions, and pelvic cul-de-sac. It was necessary to proceed with endovaginal exam following the transabdominal exam to visualize the endometrium and ovaries. Color and duplex Doppler ultrasound was utilized to evaluate blood flow to the ovaries. COMPARISON:  None. FINDINGS: Uterus Measurements: 8.5 x 4.3 x 5.0 cm = volume: 96 mL. No fibroids or other mass visualized. Endometrium Thickness: 9.  No focal abnormality visualized. Right ovary Measurements: 2.9 x 1.8 x 3.3 cm = volume: 9 mL. Normal appearance/no adnexal mass. Left ovary Measurements: 3.7 x 2.7 x 3.2 cm = volume: 16.7 mL. Normal appearance/no adnexal mass. Pulsed Doppler evaluation of both ovaries demonstrates normal low-resistance arterial and venous waveforms. Other findings Trace free fluid noted in the cul-de-sac. IMPRESSION: Unremarkable study. No findings to explain the patient's history of pain. Electronically Signed   By: Misty Stanley M.D.   On: 12/09/2017 20:04    Pending Labs Unresulted Labs (From admission, onward)    Start     Ordered   12/09/17 1711  RPR  (STI Exposure)  Once,   STAT     12/09/17 1710   12/09/17  1711  HIV antibody  (STI Exposure)  Once,   STAT     12/09/17 1710   12/09/17 1622  Culture, blood (Routine x 2)  BLOOD CULTURE X 2,   STAT     12/09/17 1622          Vitals/Pain Today's Vitals   12/09/17 2030 12/09/17 2100 12/09/17 2126 12/09/17 2130  BP: (!) 86/48 (!) 89/49  (!) 86/47  Pulse: (!) 120 (!) 115  (!) 117  Resp: (!) 27 (!) 22  (!) 22  Temp:   (!) 100.6 F (38.1 C)   TempSrc:   Oral   SpO2: 98% 97%  97%  PainSc:        Isolation Precautions No active isolations  Medications Medications  metroNIDAZOLE (FLAGYL) IVPB 500 mg (0 mg Intravenous Stopped 12/09/17 1858)  iohexol (OMNIPAQUE) 300 MG/ML solution 30 mL (has no administration in time range)  sodium chloride 0.9 % bolus 1,000 mL (0 mLs Intravenous  Stopped 12/09/17 1828)  acetaminophen (TYLENOL) tablet 650 mg (650 mg Oral Given 12/09/17 1658)  sodium chloride 0.9 % bolus 1,000 mL (0 mLs Intravenous Stopped 12/09/17 1759)    And  sodium chloride 0.9 % bolus 500 mL (0 mLs Intravenous Stopped 12/09/17 2044)    And  sodium chloride 0.9 % bolus 250 mL (0 mLs Intravenous Stopped 12/09/17 1858)  ceFEPIme (MAXIPIME) 2 g in sodium chloride 0.9 % 100 mL IVPB (0 g Intravenous Stopped 12/09/17 1759)  vancomycin (VANCOCIN) IVPB 1000 mg/200 mL premix (0 mg Intravenous Stopped 12/09/17 1917)  potassium chloride SA (K-DUR,KLOR-CON) CR tablet 40 mEq (40 mEq Oral Given 12/09/17 1826)  diphenhydrAMINE (BENADRYL) injection 25 mg (25 mg Intravenous Given 12/09/17 1918)  ibuprofen (ADVIL,MOTRIN) tablet 600 mg (600 mg Oral Given 12/09/17 2036)    Mobility walks

## 2017-12-09 NOTE — ED Notes (Signed)
Patient is stating that she would like to go home as she is feeling better. Claiborne Billings PA made aware.

## 2017-12-09 NOTE — ED Notes (Signed)
Spoke with ultrasound. They will not be here till after 7.

## 2017-12-09 NOTE — ED Provider Notes (Signed)
[facility] DEPT Provider Note   CSN: [id number] Arrival date & time: 12/09/17  1611     History   Chief Complaint Chief Complaint  Patient presents with  . Blood Infection    HPI Anne Newton is a 27 y.o. female who presents with a fever. PMH significant for asthma, depression, hypothyroidism. She states that she has had a fever for a couple days. She had a fever a couple weeks ago and took Tylenol and it got better. She also has a headache, sore throat, a mild cough, and lower abdominal pain/vaginal pain. She was seen by primary care and had a normal STD screen on 10/25. She was around a friend who had a fever as well but didn't know what she was sick from. No runny nose, nasal congestion, chest pain, SOB, back pain/flank pain, dysuria, hematuria. She also mentions she is having vaginal pain and discharge and she she cleans the area she feels "cold". No prior abdominal surgeries. She has never been pregnant.  HPI  Past Medical History:  Diagnosis Date  . Asthma    prn inhaler  . Hypothyroidism   . Nasal septal deviation 05/2017  . Nasal turbinate hypertrophy 05/2017    Patient Active Problem List   Diagnosis Date Noted  . Hepatitis B immune 12/15/2014  . Thyroid cancer ([place]) 12/16/2013  . Hypothyroidism 12/16/2013  . Unspecified asthma(493.90) 07/03/2012  . Enlargement of lymph nodes 07/03/2012    Past Surgical History:  Procedure Laterality Date  . SEPTOPLASTY N/A 06/30/2014   Procedure: SEPTOPLASTY;  Surgeon: Rozetta Nunnery, MD;  Location: [facility];  Service: ENT;  Laterality: N/A;  . SEPTOPLASTY N/A 06/26/2017   Procedure: SEPTOPLASTY;  Surgeon: Rozetta Nunnery, MD;  Location: [place];  Service: ENT;  Laterality: N/A;  . THYROIDECTOMY Left 08/28/2012   Procedure: LEFT THYROID LOBECTOMY;  Surgeon: Rozetta Nunnery, MD;  Location: [facility];  Service: ENT;  Laterality: Left;  . THYROIDECTOMY Right  10/21/2012   Procedure: COMPLETION THYROIDECTOMY (RIGHT THYROID LOBECTOMY);  Surgeon: Rozetta Nunnery, MD;  Location: [place];  Service: ENT;  Laterality: Right;  . TURBINATE REDUCTION N/A 06/30/2014   Procedure: [place] REDUCTION;  Surgeon: Rozetta Nunnery, MD;  Location: [facility];  Service: ENT;  Laterality: N/A;  . TURBINATE REDUCTION Bilateral 06/26/2017   Procedure: BILATERAL TURBINATE REDUCTION;  Surgeon: Rozetta Nunnery, MD;  Location: [place];  Service: ENT;  Laterality: Bilateral;     OB History   None      Home Medications    Prior to Admission medications   Medication Sig Start Date End Date Taking? Authorizing Provider  albuterol (PROVENTIL HFA;VENTOLIN HFA) 108 (90 Base) MCG/ACT inhaler Inhale 2 puffs into the lungs every 6 (six) hours as needed for wheezing. 02/12/17   Tereasa Coop, PA-C  FLUoxetine (PROZAC) 20 MG tablet Take 1 tablet (20 mg total) by mouth daily. If no improvement after 3 weeks, start taking 2 pills/daily. Patient not taking: Reported on 11/16/2017 07/30/17   McVey, Gelene Mink, PA-C  levonorgestrel-ethinyl estradiol (AVIANE) 0.1-20 MG-MCG tablet Take 1 tablet by mouth daily. Patient not taking: Reported on 07/30/2017 07/04/17   McVey, Gelene Mink, PA-C  magic mouthwash w/lidocaine SOLN Take 10 mLs by mouth every 2 (two) hours as needed for mouth pain. 11/16/17   Leonie Douglas, PA-C    Family History No family history on file.  Social History Social History  Tobacco Use  . Smoking status: Never Smoker  . Smokeless tobacco: Never Used  Substance Use Topics  . Alcohol use: No  . Drug use: No     Allergies   Chicken allergy and Shrimp [shellfish allergy]   Review of Systems Review of Systems  Constitutional: Positive for chills and fever.  HENT: Positive for sore throat. Negative for congestion, ear pain and rhinorrhea.   Respiratory: Positive for cough. Negative for shortness  of breath.   Cardiovascular: Negative for chest pain.  Gastrointestinal: Positive for abdominal pain. Negative for diarrhea, nausea and vomiting.  Genitourinary: Positive for dyspareunia, pelvic pain and vaginal discharge. Negative for dysuria, flank pain, menstrual problem and vaginal bleeding.  All other systems reviewed and are negative.    Physical Exam Updated Vital Signs BP (!) 92/54 (BP Location: Left Arm)   Pulse (!) 130   Temp (!) 103 F (39.4 C) (Oral)   Resp 20   LMP 11/16/2017   SpO2 95%   Physical Exam  Constitutional: She is oriented to person, place, and time. She appears well-developed and well-nourished. No distress.  Calm and cooperative. Appears uncomfortable and mildly ill  HENT:  Head: Normocephalic and atraumatic.  Eyes: Pupils are equal, round, and reactive to light. Conjunctivae are normal. Right eye exhibits no discharge. Left eye exhibits no discharge. No scleral icterus.  Neck: Normal range of motion.  Cardiovascular: Tachycardia present. Exam reveals no gallop and no friction rub.  No murmur heard. Pulmonary/Chest: Effort normal and breath sounds normal. No respiratory distress.  Abdominal: Soft. Bowel sounds are normal. She exhibits no distension and no mass. There is tenderness (diffuse lower abdominal tenderness). There is no rebound and no guarding. No hernia.  Genitourinary:  Genitourinary Comments: Pelvic: No inguinal lymphadenopathy or inguinal hernia noted. Normal external genitalia. No pain with speculum insertion. Closed cervical os with normal appearance - no rash or lesions. No significant discharge or bleeding noted from cervix or in vaginal vault. On bimanual there is +CMT and adnexal tenderness. Chaperone Rolla Plate, Therapist, sports) present during exam.    Neurological: She is alert and oriented to person, place, and time.  Skin: Skin is warm and dry.  Psychiatric: She has a normal mood and affect. Her behavior is normal.  Nursing note and vitals  reviewed.    ED Treatments / Results  Labs (all labs ordered are listed, but only abnormal results are displayed) Labs Reviewed  WET PREP, GENITAL - Abnormal; Notable for the following components:      Result Value   Yeast Wet Prep HPF POC FEW (*)    Clue Cells Wet Prep HPF POC PRESENT (*)    WBC, Wet Prep HPF POC MANY (*)    All other components within normal limits  COMPREHENSIVE METABOLIC PANEL - Abnormal; Notable for the following components:   Potassium 3.3 (*)    Glucose, Bld 113 (*)    Total Protein 8.4 (*)    AST 77 (*)    ALT 50 (*)    Total Bilirubin 1.6 (*)    All other components within normal limits  CBC WITH DIFFERENTIAL/PLATELET - Abnormal; Notable for the following components:   Neutro Abs 8.0 (*)    Lymphs Abs 0.5 (*)    Monocytes Absolute 0.0 (*)    All other components within normal limits  CG4 I-STAT (LACTIC ACID) - Abnormal; Notable for the following components:   Lactic Acid, Venous 4.64 (*)    All other components within normal limits  GROUP  A STREP BY PCR  CULTURE, BLOOD (ROUTINE X 2)  CULTURE, BLOOD (ROUTINE X 2)  PROTIME-INR  URINALYSIS, ROUTINE W REFLEX MICROSCOPIC  MONONUCLEOSIS SCREEN  INFLUENZA PANEL BY PCR (TYPE A & B)  RPR  HIV ANTIBODY (ROUTINE TESTING W REFLEX)  I-STAT CG4 LACTIC ACID, ED  I-STAT BETA HCG BLOOD, ED ([facility], [facility], [facility] ONLY)  I-STAT BETA HCG BLOOD, ED (NOT ORDERABLE)  I-STAT CG4 LACTIC ACID, ED  CG4 I-STAT (LACTIC ACID)  I-STAT CG4 LACTIC ACID, ED  GC/CHLAMYDIA PROBE AMP ([place]) NOT AT [facility]    EKG None  Radiology Dg Chest 2 View  Result Date: 12/09/2017 CLINICAL DATA:  Possible sepsis EXAM: CHEST - 2 VIEW COMPARISON:  10/16/2012 FINDINGS: The heart size and mediastinal contours are within normal limits. Both lungs are clear. The visualized skeletal structures are unremarkable. IMPRESSION: No active cardiopulmonary disease. Electronically Signed   By: Donavan Foil M.D.   On: 12/09/2017 18:19   US Transvaginal  Non-ob  Result Date: 12/09/2017 CLINICAL DATA:  Pelvic pain EXAM: TRANSABDOMINAL AND TRANSVAGINAL ULTRASOUND OF PELVIS DOPPLER ULTRASOUND OF OVARIES TECHNIQUE: Both transabdominal and transvaginal ultrasound examinations of the pelvis were performed. Transabdominal technique was performed for global imaging of the pelvis including uterus, ovaries, adnexal regions, and pelvic cul-de-sac. It was necessary to proceed with endovaginal exam following the transabdominal exam to visualize the endometrium and ovaries. Color and duplex Doppler ultrasound was utilized to evaluate blood flow to the ovaries. COMPARISON:  None. FINDINGS: Uterus Measurements: 8.5 x 4.3 x 5.0 cm = volume: 96 mL. No fibroids or other mass visualized. Endometrium Thickness: 9.  No focal abnormality visualized. Right ovary Measurements: 2.9 x 1.8 x 3.3 cm = volume: 9 mL. Normal appearance/no adnexal mass. Left ovary Measurements: 3.7 x 2.7 x 3.2 cm = volume: 16.7 mL. Normal appearance/no adnexal mass. Pulsed Doppler evaluation of both ovaries demonstrates normal low-resistance arterial and venous waveforms. Other findings Trace free fluid noted in the cul-de-sac. IMPRESSION: Unremarkable study. No findings to explain the patient's history of pain. Electronically Signed   By: Misty Stanley M.D.   On: 12/09/2017 20:04   US Pelvis Complete  Result Date: 12/09/2017 CLINICAL DATA:  Pelvic pain EXAM: TRANSABDOMINAL AND TRANSVAGINAL ULTRASOUND OF PELVIS DOPPLER ULTRASOUND OF OVARIES TECHNIQUE: Both transabdominal and transvaginal ultrasound examinations of the pelvis were performed. Transabdominal technique was performed for global imaging of the pelvis including uterus, ovaries, adnexal regions, and pelvic cul-de-sac. It was necessary to proceed with endovaginal exam following the transabdominal exam to visualize the endometrium and ovaries. Color and duplex Doppler ultrasound was utilized to evaluate blood flow to the ovaries. COMPARISON:  None.  FINDINGS: Uterus Measurements: 8.5 x 4.3 x 5.0 cm = volume: 96 mL. No fibroids or other mass visualized. Endometrium Thickness: 9.  No focal abnormality visualized. Right ovary Measurements: 2.9 x 1.8 x 3.3 cm = volume: 9 mL. Normal appearance/no adnexal mass. Left ovary Measurements: 3.7 x 2.7 x 3.2 cm = volume: 16.7 mL. Normal appearance/no adnexal mass. Pulsed Doppler evaluation of both ovaries demonstrates normal low-resistance arterial and venous waveforms. Other findings Trace free fluid noted in the cul-de-sac. IMPRESSION: Unremarkable study. No findings to explain the patient's history of pain. Electronically Signed   By: Misty Stanley M.D.   On: 12/09/2017 20:04   [place] Art/ven Flow Abd Pelv Doppler  Result Date: 12/09/2017 CLINICAL DATA:  Pelvic pain EXAM: TRANSABDOMINAL AND TRANSVAGINAL ULTRASOUND OF PELVIS DOPPLER ULTRASOUND OF OVARIES TECHNIQUE: Both transabdominal and transvaginal ultrasound examinations of the  pelvis were performed. Transabdominal technique was performed for global imaging of the pelvis including uterus, ovaries, adnexal regions, and pelvic cul-de-sac. It was necessary to proceed with endovaginal exam following the transabdominal exam to visualize the endometrium and ovaries. Color and duplex Doppler ultrasound was utilized to evaluate blood flow to the ovaries. COMPARISON:  None. FINDINGS: Uterus Measurements: 8.5 x 4.3 x 5.0 cm = volume: 96 mL. No fibroids or other mass visualized. Endometrium Thickness: 9.  No focal abnormality visualized. Right ovary Measurements: 2.9 x 1.8 x 3.3 cm = volume: 9 mL. Normal appearance/no adnexal mass. Left ovary Measurements: 3.7 x 2.7 x 3.2 cm = volume: 16.7 mL. Normal appearance/no adnexal mass. Pulsed Doppler evaluation of both ovaries demonstrates normal low-resistance arterial and venous waveforms. Other findings Trace free fluid noted in the cul-de-sac. IMPRESSION: Unremarkable study. No findings to explain the patient's history of pain.  Electronically Signed   By: Misty Stanley M.D.   On: 12/09/2017 20:04    Procedures Procedures (including critical care time)  CRITICAL CARE Performed by: Recardo Evangelist   Total critical care time: 40 minutes  Critical care time was exclusive of separately billable procedures and treating other patients.  Critical care was necessary to treat or prevent imminent or life-threatening deterioration.  Critical care was time spent personally by me on the following activities: development of treatment plan with patient and/or surrogate as well as nursing, discussions with consultants, evaluation of patient's response to treatment, examination of patient, obtaining history from patient or surrogate, ordering and performing treatments and interventions, ordering and review of laboratory studies, ordering and review of radiographic studies, pulse oximetry and re-evaluation of patient's condition.   Medications Ordered in ED Medications  metroNIDAZOLE (FLAGYL) IVPB 500 mg (0 mg Intravenous Stopped 12/09/17 1858)  iohexol (OMNIPAQUE) 300 MG/ML solution 30 mL (has no administration in time range)  sodium chloride 0.9 % bolus 1,000 mL (0 mLs Intravenous Stopped 12/09/17 1828)  acetaminophen (TYLENOL) tablet 650 mg (650 mg Oral Given 12/09/17 1658)  sodium chloride 0.9 % bolus 1,000 mL (0 mLs Intravenous Stopped 12/09/17 1759)    And  sodium chloride 0.9 % bolus 500 mL (0 mLs Intravenous Stopped 12/09/17 2044)    And  sodium chloride 0.9 % bolus 250 mL (0 mLs Intravenous Stopped 12/09/17 1858)  ceFEPIme (MAXIPIME) 2 g in sodium chloride 0.9 % 100 mL IVPB (0 g Intravenous Stopped 12/09/17 1759)  vancomycin (VANCOCIN) IVPB 1000 mg/200 mL premix (0 mg Intravenous Stopped 12/09/17 1917)  potassium chloride SA (K-DUR,KLOR-CON) CR tablet 40 mEq (40 mEq Oral Given 12/09/17 1826)  diphenhydrAMINE (BENADRYL) injection 25 mg (25 mg Intravenous Given 12/09/17 1918)  ibuprofen (ADVIL,MOTRIN) tablet 600 mg  (600 mg Oral Given 12/09/17 2036)     Initial Impression / Assessment and Plan / ED Course  I have reviewed the triage vital signs and the nursing notes.  Pertinent labs & imaging results that were available during my care of the patient were reviewed by me and considered in my medical decision making (see chart for details).  27 year old presents with fever. She is not a great historian and tells me she has a sore throat and some lower abdominal tenderness and vaginal pain/discharge. She is febrile to 103 here. She is tachycardic in the 130s. She has soft BP although her baseline BP is 100s. Will initiate code sepsis.  6PM Initial lactic acid is 4.64. CBC is normal. CMP is remarkable for mild hypokalemia and mildly elevated LFT and bilirubin.  CXR is negative. She has no abdominal tenderness on exam. She does have CMT and adnexal tenderness on her pelvic but no significant discharge. Wet prep shows yeast, clue cells, and many WBC. UA is normal. Mono, strep, flu are negative.  6:30PM Pelvic US is normal.   7:21 PM Pt seems to be having a reaction to one of the IV antibiotics. She has itching and redness of the upper chest, right neck. Will order IV Benadryl.  9:37 PM After 2.75L fluid bolus and IV antibiotics and antipyretics her tachycardia and blood pressures have not improved although she feels better. Will admit to medicine. Discussed with Dr. Hal Hope who will admit. He would like a CT of her abdomen/pelvis to r/o appendicitis. Shared visit with Dr. Ashok Cordia   Final Clinical Impressions(s) / ED Diagnoses   Final diagnoses:  Sepsis, due to unspecified organism, unspecified whether acute organ dysfunction present [facility])  Elevated LFTs  Yeast infection  Hypokalemia    ED Discharge Orders    None       Recardo Evangelist, PA-C 12/09/17 2204    Lajean Saver, MD 12/10/17 1154

## 2017-12-10 ENCOUNTER — Inpatient Hospital Stay (HOSPITAL_COMMUNITY): Payer: Self-pay

## 2017-12-10 ENCOUNTER — Encounter (HOSPITAL_COMMUNITY): Payer: Self-pay | Admitting: Internal Medicine

## 2017-12-10 ENCOUNTER — Other Ambulatory Visit: Payer: Self-pay

## 2017-12-10 DIAGNOSIS — R945 Abnormal results of liver function studies: Secondary | ICD-10-CM

## 2017-12-10 DIAGNOSIS — A419 Sepsis, unspecified organism: Principal | ICD-10-CM

## 2017-12-10 DIAGNOSIS — E876 Hypokalemia: Secondary | ICD-10-CM

## 2017-12-10 DIAGNOSIS — R6521 Severe sepsis with septic shock: Secondary | ICD-10-CM

## 2017-12-10 LAB — BASIC METABOLIC PANEL
Anion gap: 6 (ref 5–15)
BUN: 11 mg/dL (ref 6–20)
CALCIUM: 7.3 mg/dL — AB (ref 8.9–10.3)
CO2: 19 mmol/L — AB (ref 22–32)
CREATININE: 0.65 mg/dL (ref 0.44–1.00)
Chloride: 116 mmol/L — ABNORMAL HIGH (ref 98–111)
GFR calc Af Amer: >60 mL/min (ref >60)
GFR calc non Af Amer: >60 mL/min (ref >60)
GLUCOSE: 126 mg/dL — AB (ref 70–99)
Potassium: 3.7 mmol/L (ref 3.5–5.1)
Sodium: 141 mmol/L (ref 135–145)

## 2017-12-10 LAB — CREATININE, SERUM
Creatinine, Ser: 0.75 mg/dL (ref 0.44–1.00)
GFR calc Af Amer: >60 mL/min (ref >60)

## 2017-12-10 LAB — CBC WITH DIFFERENTIAL/PLATELET
ABS IMMATURE GRANULOCYTES: 0.24 10*3/uL — AB (ref 0.00–0.07)
Basophils Absolute: 0.1 10*3/uL (ref 0.0–0.1)
Basophils Relative: 0 %
Eosinophils Absolute: 0 10*3/uL (ref 0.0–0.5)
Eosinophils Relative: 0 %
HEMATOCRIT: 35.3 % — AB (ref 36.0–46.0)
HEMOGLOBIN: 10.9 g/dL — AB (ref 12.0–15.0)
Immature Granulocytes: 1 %
LYMPHS ABS: 0.6 10*3/uL — AB (ref 0.7–4.0)
LYMPHS PCT: 3 %
MCH: 28.5 pg (ref 26.0–34.0)
MCHC: 30.9 g/dL (ref 30.0–36.0)
MCV: 92.4 fL (ref 80.0–100.0)
MONO ABS: 0.5 10*3/uL (ref 0.1–1.0)
MONOS PCT: 3 %
Neutro Abs: 18.4 10*3/uL — ABNORMAL HIGH (ref 1.7–7.7)
Neutrophils Relative %: 93 %
Platelets: 188 10*3/uL (ref 150–400)
RBC: 3.82 MIL/uL — AB (ref 3.87–5.11)
RDW: 13.2 % (ref 11.5–15.5)
WBC: 19.8 10*3/uL — AB (ref 4.0–10.5)
nRBC: 0 % (ref 0.0–0.2)

## 2017-12-10 LAB — MRSA PCR SCREENING: MRSA by PCR: NEGATIVE

## 2017-12-10 LAB — CBC
HEMATOCRIT: 36.8 % (ref 36.0–46.0)
Hemoglobin: 10.8 g/dL — ABNORMAL LOW (ref 12.0–15.0)
MCH: 28.1 pg (ref 26.0–34.0)
MCHC: 29.3 g/dL — AB (ref 30.0–36.0)
MCV: 95.8 fL (ref 80.0–100.0)
Platelets: 213 10*3/uL (ref 150–400)
RBC: 3.84 MIL/uL — ABNORMAL LOW (ref 3.87–5.11)
RDW: 13.2 % (ref 11.5–15.5)
WBC: 22 10*3/uL — AB (ref 4.0–10.5)
nRBC: 0 % (ref 0.0–0.2)

## 2017-12-10 LAB — RPR: RPR Ser Ql: NONREACTIVE

## 2017-12-10 LAB — HEPATIC FUNCTION PANEL
ALT: 66 U/L — AB (ref 0–44)
AST: 80 U/L — AB (ref 15–41)
Albumin: 2.8 g/dL — ABNORMAL LOW (ref 3.5–5.0)
Alkaline Phosphatase: 74 U/L (ref 38–126)
BILIRUBIN DIRECT: 0.3 mg/dL — AB (ref 0.0–0.2)
BILIRUBIN TOTAL: 1.5 mg/dL — AB (ref 0.3–1.2)
Indirect Bilirubin: 1.2 mg/dL — ABNORMAL HIGH (ref 0.3–0.9)
Total Protein: 5.8 g/dL — ABNORMAL LOW (ref 6.5–8.1)

## 2017-12-10 LAB — GC/CHLAMYDIA PROBE AMP (~~LOC~~) NOT AT ARMC
CHLAMYDIA, DNA PROBE: NEGATIVE
Neisseria Gonorrhea: NEGATIVE

## 2017-12-10 LAB — HIV ANTIBODY (ROUTINE TESTING W REFLEX): HIV Screen 4th Generation wRfx: NONREACTIVE

## 2017-12-10 LAB — LACTIC ACID, PLASMA
LACTIC ACID, VENOUS: 1.4 mmol/L (ref 0.5–1.9)
Lactic Acid, Venous: 1.5 mmol/L (ref 0.5–1.9)

## 2017-12-10 LAB — PROTIME-INR
INR: 1.31
Prothrombin Time: 16.1 seconds — ABNORMAL HIGH (ref 11.4–15.2)

## 2017-12-10 LAB — PROCALCITONIN: Procalcitonin: 88.84 ng/mL

## 2017-12-10 MED ORDER — ALBUTEROL SULFATE (2.5 MG/3ML) 0.083% IN NEBU: 3.0000 mL | INHALATION_SOLUTION | Freq: Four times a day (QID) | RESPIRATORY_TRACT | Status: DC | PRN

## 2017-12-10 MED ORDER — SODIUM CHLORIDE 0.9 % IV SOLN
2.0000 g | Freq: Two times a day (BID) | INTRAVENOUS | Status: DC
  Administered 2017-12-10 – 2017-12-11 (×3): 2 g via INTRAVENOUS
  Filled 2017-12-10 (×3): qty 2

## 2017-12-10 MED ORDER — VANCOMYCIN HCL IN DEXTROSE 750-5 MG/150ML-% IV SOLN
750.0000 mg | Freq: Two times a day (BID) | INTRAVENOUS | Status: DC
  Administered 2017-12-10 (×2): 750 mg via INTRAVENOUS
  Filled 2017-12-10 (×3): qty 150

## 2017-12-10 MED ORDER — GADOBUTROL 1 MMOL/ML IV SOLN
7.5000 mL | Freq: Once | INTRAVENOUS | Status: AC | PRN
  Administered 2017-12-10: 7 mL via INTRAVENOUS

## 2017-12-10 MED ORDER — LEVOTHYROXINE SODIUM 88 MCG PO TABS
88.0000 ug | ORAL_TABLET | Freq: Every day | ORAL | Status: DC
  Administered 2017-12-10 – 2017-12-14 (×5): 88 ug via ORAL
  Filled 2017-12-10 (×5): qty 1

## 2017-12-10 MED ORDER — ACETAMINOPHEN 650 MG RE SUPP: 650.0000 mg | Freq: Four times a day (QID) | RECTAL | Status: DC | PRN

## 2017-12-10 MED ORDER — ENOXAPARIN SODIUM 40 MG/0.4ML ~~LOC~~ SOLN: 40.0000 mg | Freq: Every day | SUBCUTANEOUS | Status: DC

## 2017-12-10 MED ORDER — ACETAMINOPHEN 325 MG PO TABS
650.0000 mg | ORAL_TABLET | Freq: Four times a day (QID) | ORAL | Status: DC | PRN
  Administered 2017-12-10 – 2017-12-13 (×2): 650 mg via ORAL
  Filled 2017-12-10 (×2): qty 2

## 2017-12-10 MED ORDER — POTASSIUM CHLORIDE CRYS ER 20 MEQ PO TBCR
20.0000 meq | EXTENDED_RELEASE_TABLET | Freq: Once | ORAL | Status: AC
  Administered 2017-12-10: 20 meq via ORAL
  Filled 2017-12-10: qty 1

## 2017-12-10 MED ORDER — SODIUM CHLORIDE 0.9 % IV SOLN
100.0000 mg | Freq: Two times a day (BID) | INTRAVENOUS | Status: DC
  Administered 2017-12-10 (×3): 100 mg via INTRAVENOUS
  Filled 2017-12-10 (×4): qty 100

## 2017-12-10 MED ORDER — IOHEXOL 300 MG/ML  SOLN
100.0000 mL | Freq: Once | INTRAMUSCULAR | Status: AC | PRN
  Administered 2017-12-10: 100 mL via INTRAVENOUS

## 2017-12-10 MED ORDER — IOPAMIDOL (ISOVUE-300) INJECTION 61%: 100.0000 mL | Freq: Once | INTRAVENOUS | Status: DC | PRN

## 2017-12-10 MED ORDER — ONDANSETRON HCL 4 MG PO TABS
4.0000 mg | ORAL_TABLET | Freq: Four times a day (QID) | ORAL | Status: DC | PRN
  Administered 2017-12-11: 4 mg via ORAL

## 2017-12-10 MED ORDER — SODIUM CHLORIDE (PF) 0.9 % IJ SOLN
INTRAMUSCULAR | Status: AC
  Administered 2017-12-10: 01:00:00
  Filled 2017-12-10: qty 50

## 2017-12-10 MED ORDER — SODIUM CHLORIDE 0.9 % IV SOLN
INTRAVENOUS | Status: AC
  Administered 2017-12-10 (×2): via INTRAVENOUS

## 2017-12-10 MED ORDER — ONDANSETRON HCL 4 MG/2ML IJ SOLN
4.0000 mg | Freq: Four times a day (QID) | INTRAMUSCULAR | Status: DC | PRN
  Administered 2017-12-11 – 2017-12-14 (×4): 4 mg via INTRAVENOUS
  Filled 2017-12-10 (×5): qty 2

## 2017-12-10 NOTE — Progress Notes (Addendum)
Pharmacy Antibiotic Note  Anne Newton is a 27 y.o. female admitted on 12/09/2017 with sepsis.  Pharmacy has been consulted for vancomycin dosing. Sepsis likely from PID.   Plan: Vancomycin 1 Gm x1 then 750 mg IV q12h for est AUC = 480 Goal AUC = 400-500 F/u scr/cultures/levels Doxy 100 mg IV q12h (MD) Cefotetan 2 Gm IV q12h (MD) Flagyl 500 mg IV q8h (MD)   Height: 5\' 1"  (154.9 cm) Weight: 123 lb 7.3 oz (56 kg) IBW/kg (Calculated) : 47.8  Temp (24hrs), Avg:100.3 F (37.9 C), Min:97.8 F (36.6 C), Max:103 F (39.4 C)  Recent Labs  Lab 12/09/17 1649 12/09/17 1656 12/09/17 2053 12/10/17 0112 12/10/17 0305  WBC 8.6  --   --  22.0* 19.8*  CREATININE 0.90  --   --  0.75 0.65  LATICACIDVEN  --  4.64* 1.14 1.4 1.5    Estimated Creatinine Clearance: 81.1 mL/min (by C-G formula based on SCr of 0.65 mg/dL).    Allergies  Allergen Reactions  . Chicken Allergy Shortness Of Breath    EXACERBATES ASTHMA  . Shrimp [Shellfish Allergy] Shortness Of Breath    EXACERBATES ASTHMA    Antimicrobials this admission: 11/18 cefepime >>  11/18 doxy >>  11/17 vancomycin >> 11/18 flagyl >> 11/17 cefepime >> x1 ED  Dose adjustments this admission:   Microbiology results:  BCx:   UCx:    Sputum:    MRSA PCR:   Thank you for allowing pharmacy to be a part of this patient's care.  Dorrene German 12/10/2017 6:27 AM

## 2017-12-10 NOTE — H&P (Addendum)
History and Physical    Anne Newton XVQ:[id number] DOB: July 21, 1990 DOA: 12/09/2017  PCP: Foye Spurling, MD  Patient coming from: Home.  Chief Complaint: Fever chills.  HPI: Anne Newton is a 27 y.o. female with history of papillary thyroid cancer status post thyroidectomy on Synthroid replacement therapy presents to the ER with complaints of fever and chills since morning.  Patient states over the last 2 days patient has been having pain around the vaginal area with some whitish discharge.  Denies any nausea vomiting or diarrhea has been having some sore throat-like feeling.  Denies any shortness of breath or productive cough states she was around a friend was sick.  Denies any dizziness or loss of consciousness or any headache or neck pain.  ED Course: In the ER on exam by the ER physician patient had pelvic tenderness.  Wet prep showed clue cells.  Patient blood pressure was in the low normal with elevated lactate was given fluid bolus for sepsis.  UA and chest x-ray unremarkable.  Pelvic ultrasound did not show anything acute.  Blood cultures were obtained and started on empiric antibiotics for PID.  Influenza PCR is negative patient is admitted for sepsis likely from PID.  CT abdomen and pelvis is pending.  Left is a mildly elevated.  Review of Systems: As per HPI, rest all negative.   Past Medical History:  Diagnosis Date  . Asthma    prn inhaler  . Hypothyroidism   . Nasal septal deviation 05/2017  . Nasal turbinate hypertrophy 05/2017    Past Surgical History:  Procedure Laterality Date  . SEPTOPLASTY N/A 06/30/2014   Procedure: SEPTOPLASTY;  Surgeon: Rozetta Nunnery, MD;  Location: [facility];  Service: ENT;  Laterality: N/A;  . SEPTOPLASTY N/A 06/26/2017   Procedure: SEPTOPLASTY;  Surgeon: Rozetta Nunnery, MD;  Location: [facility];  Service: ENT;  Laterality: N/A;  . THYROIDECTOMY Left 08/28/2012   Procedure: LEFT THYROID LOBECTOMY;   Surgeon: Rozetta Nunnery, MD;  Location: [facility];  Service: ENT;  Laterality: Left;  . THYROIDECTOMY Right 10/21/2012   Procedure: COMPLETION THYROIDECTOMY (RIGHT THYROID LOBECTOMY);  Surgeon: Rozetta Nunnery, MD;  Location: [facility];  Service: ENT;  Laterality: Right;  . TURBINATE REDUCTION N/A 06/30/2014   Procedure: [place] REDUCTION;  Surgeon: Rozetta Nunnery, MD;  Location: [facility];  Service: ENT;  Laterality: N/A;  . TURBINATE REDUCTION Bilateral 06/26/2017   Procedure: BILATERAL TURBINATE REDUCTION;  Surgeon: Rozetta Nunnery, MD;  Location: [facility];  Service: ENT;  Laterality: Bilateral;     reports that she has never smoked. She has never used smokeless tobacco. She reports that she does not drink alcohol or use drugs.  Allergies  Allergen Reactions  . Chicken Allergy Shortness Of Breath    EXACERBATES ASTHMA  . Shrimp [Shellfish Allergy] Shortness Of Breath    EXACERBATES ASTHMA    Family History  Problem Relation Age of Onset  . Diabetes Mellitus II Neg Hx     Prior to Admission medications   Medication Sig Start Date End Date Taking? Authorizing Provider  albuterol (PROVENTIL HFA;VENTOLIN HFA) 108 (90 Base) MCG/ACT inhaler Inhale 2 puffs into the lungs every 6 (six) hours as needed for wheezing. 02/12/17  Yes Tereasa Coop, PA-C  levothyroxine (SYNTHROID, LEVOTHROID) 88 MCG tablet Take 88 mcg by mouth daily before breakfast.   Yes [provider]  FLUoxetine (PROZAC) 20 MG tablet Take 1  tablet (20 mg total) by mouth daily. If no improvement after 3 weeks, start taking 2 pills/daily. Patient not taking: Reported on 11/16/2017 07/30/17   McVey, Gelene Mink, PA-C  levonorgestrel-ethinyl estradiol (AVIANE) 0.1-20 MG-MCG tablet Take 1 tablet by mouth daily. Patient not taking: Reported on 07/30/2017 07/04/17   McVey, Gelene Mink, PA-C  magic mouthwash w/lidocaine SOLN Take 10 mLs by mouth every 2 (two) hours as  needed for mouth pain. Patient not taking: Reported on 12/09/2017 11/16/17   Leonie Douglas, [place]    Physical Exam: Vitals:   12/09/17 2200 12/09/17 2230 12/09/17 2300 12/10/17 0027  BP: (!) 90/44 95/61 (!) 82/47   Pulse: (!) 110 (!) 109 (!) 107   Resp: (!) 27 19 (!) 22   Temp:  98.7 F (37.1 C)  99 F (37.2 C)  TempSrc:  Oral  Oral  SpO2: 97% 100% 98%   Height:    5\' 1"  (1.549 m)      Constitutional: Moderately built and nourished. Vitals:   12/09/17 2200 12/09/17 2230 12/09/17 2300 12/10/17 0027  BP: (!) 90/44 95/61 (!) 82/47   Pulse: (!) 110 (!) 109 (!) 107   Resp: (!) 27 19 (!) 22   Temp:  98.7 F (37.1 C)  99 F (37.2 C)  TempSrc:  Oral  Oral  SpO2: 97% 100% 98%   Height:    5\' 1"  (1.549 m)   Eyes: Anicteric no pallor. ENMT: No discharge from the ears eyes nose or mouth. Neck: No mass felt.  No neck rigidity.  No JVD appreciated. Respiratory: No rhonchi or crepitations. Cardiovascular: S1-S2 heard no murmurs appreciated. Abdomen: Mild lower abdominal tenderness no guarding or rigidity.  No rebound tenderness. Musculoskeletal: No edema. Skin: No rash. Neurologic: Alert awake oriented to time place and person.  Moves all extremities. Psychiatric: Appears normal per normal affect.   Labs on Admission: I have personally reviewed following labs and imaging studies  CBC: Recent Labs  Lab 12/09/17 1649  WBC 8.6  NEUTROABS 8.0*  HGB 13.8  HCT 45.2  MCV 88.8  PLT 947   Basic Metabolic Panel: Recent Labs  Lab 12/09/17 1649  NA 141  K 3.3*  CL 103  CO2 24  GLUCOSE 113*  BUN 18  CREATININE 0.90  CALCIUM 9.4   GFR: CrCl cannot be calculated (Unknown ideal weight.). Liver Function Tests: Recent Labs  Lab 12/09/17 1649  AST 77*  ALT 50*  ALKPHOS 104  BILITOT 1.6*  PROT 8.4*  ALBUMIN 4.3   No results for input(s): LIPASE, AMYLASE in the last 168 hours. No results for input(s): AMMONIA in the last 168 hours. Coagulation Profile: Recent  Labs  Lab 12/09/17 1649  INR 1.05   Cardiac Enzymes: No results for input(s): CKTOTAL, CKMB, CKMBINDEX, TROPONINI in the last 168 hours. BNP (last 3 results) No results for input(s): PROBNP in the last 8760 hours. HbA1C: No results for input(s): HGBA1C in the last 72 hours. CBG: No results for input(s): GLUCAP in the last 168 hours. Lipid Profile: No results for input(s): CHOL, HDL, LDLCALC, TRIG, CHOLHDL, LDLDIRECT in the last 72 hours. Thyroid Function Tests: No results for input(s): TSH, T4TOTAL, FREET4, T3FREE, THYROIDAB in the last 72 hours. Anemia Panel: No results for input(s): VITAMINB12, FOLATE, FERRITIN, TIBC, IRON, RETICCTPCT in the last 72 hours. Urine analysis:    Component Value Date/Time   COLORURINE YELLOW 12/09/2017 2100   APPEARANCEUR CLEAR 12/09/2017 2100   LABSPEC 1.005 12/09/2017 2100   PHURINE 6.0  12/09/2017 2100   GLUCOSEU NEGATIVE 12/09/2017 2100   HGBUR NEGATIVE 12/09/2017 2100   [place] NEGATIVE 12/09/2017 2100   BILIRUBINUR neg 09/28/2014 [place] 12/09/2017 2100   PROTEINUR NEGATIVE 12/09/2017 2100   UROBILINOGEN 0.2 09/28/2014 1257   NITRITE NEGATIVE 12/09/2017 2100   LEUKOCYTESUR NEGATIVE 12/09/2017 2100   Sepsis Labs: @LABRCNTIP (procalcitonin:4,lacticidven:4) ) Recent Results (from the past 240 hour(s))  Group A Strep by PCR     Status: None   Collection Time: 12/09/17  4:58 PM  Result Value Ref Range Status   Group A Strep by PCR NOT DETECTED NOT DETECTED Final    Comment: Performed at [facility], [place] [address] [place], [place] [address]  Culture, blood (Routine x 2)     Status: None (Preliminary result)   Collection Time: 12/09/17  5:07 PM  Result Value Ref Range Status   Specimen Description   Final    RIGHT ANTECUBITAL Performed at [facility] [address] [place], [place] [address]    Special Requests   Final    BOTTLES DRAWN AEROBIC AND ANAEROBIC Blood Culture  adequate volume   Culture PENDING  Incomplete   Report Status PENDING  Incomplete  Wet prep, genital     Status: Abnormal   Collection Time: 12/09/17  5:11 PM  Result Value Ref Range Status   Yeast Wet Prep HPF POC FEW (A) NONE SEEN Final   Trich, Wet Prep NONE SEEN NONE SEEN Final   Clue Cells Wet Prep HPF POC PRESENT (A) NONE SEEN Final   WBC, Wet Prep HPF POC MANY (A) NONE SEEN Final   Sperm NONE SEEN  Final    Comment: Performed at [facility], [place] [address] [place], [place] [address]     Radiological Exams on Admission: Dg Chest 2 View  Result Date: 12/09/2017 CLINICAL DATA:  Possible sepsis EXAM: CHEST - 2 VIEW COMPARISON:  10/16/2012 FINDINGS: The heart size and mediastinal contours are within normal limits. Both lungs are clear. The visualized skeletal structures are unremarkable. IMPRESSION: No active cardiopulmonary disease. Electronically Signed   By: Donavan Foil M.D.   On: 12/09/2017 18:19   US Transvaginal Non-ob  Result Date: 12/09/2017 CLINICAL DATA:  Pelvic pain EXAM: TRANSABDOMINAL AND TRANSVAGINAL ULTRASOUND OF PELVIS DOPPLER ULTRASOUND OF OVARIES TECHNIQUE: Both transabdominal and transvaginal ultrasound examinations of the pelvis were performed. Transabdominal technique was performed for global imaging of the pelvis including uterus, ovaries, adnexal regions, and pelvic cul-de-sac. It was necessary to proceed with endovaginal exam following the transabdominal exam to visualize the endometrium and ovaries. Color and duplex Doppler ultrasound was utilized to evaluate blood flow to the ovaries. COMPARISON:  None. FINDINGS: Uterus Measurements: 8.5 x 4.3 x 5.0 cm = volume: 96 mL. No fibroids or other mass visualized. Endometrium Thickness: 9.  No focal abnormality visualized. Right ovary Measurements: 2.9 x 1.8 x 3.3 cm = volume: 9 mL. Normal appearance/no adnexal mass. Left ovary Measurements: 3.7 x 2.7 x 3.2 cm = volume: 16.7 mL. Normal appearance/no  adnexal mass. Pulsed Doppler evaluation of both ovaries demonstrates normal low-resistance arterial and venous waveforms. Other findings Trace free fluid noted in the cul-de-sac. IMPRESSION: Unremarkable study. No findings to explain the patient's history of pain. Electronically Signed   By: Misty Stanley M.D.   On: 12/09/2017 20:04   US Pelvis Complete  Result Date: 12/09/2017 CLINICAL DATA:  Pelvic pain EXAM: TRANSABDOMINAL AND TRANSVAGINAL ULTRASOUND OF PELVIS DOPPLER ULTRASOUND OF OVARIES  TECHNIQUE: Both transabdominal and transvaginal ultrasound examinations of the pelvis were performed. Transabdominal technique was performed for global imaging of the pelvis including uterus, ovaries, adnexal regions, and pelvic cul-de-sac. It was necessary to proceed with endovaginal exam following the transabdominal exam to visualize the endometrium and ovaries. Color and duplex Doppler ultrasound was utilized to evaluate blood flow to the ovaries. COMPARISON:  None. FINDINGS: Uterus Measurements: 8.5 x 4.3 x 5.0 cm = volume: 96 mL. No fibroids or other mass visualized. Endometrium Thickness: 9.  No focal abnormality visualized. Right ovary Measurements: 2.9 x 1.8 x 3.3 cm = volume: 9 mL. Normal appearance/no adnexal mass. Left ovary Measurements: 3.7 x 2.7 x 3.2 cm = volume: 16.7 mL. Normal appearance/no adnexal mass. Pulsed Doppler evaluation of both ovaries demonstrates normal low-resistance arterial and venous waveforms. Other findings Trace free fluid noted in the cul-de-sac. IMPRESSION: Unremarkable study. No findings to explain the patient's history of pain. Electronically Signed   By: Misty Stanley M.D.   On: 12/09/2017 20:04   [place] Art/ven Flow Abd Pelv Doppler  Result Date: 12/09/2017 CLINICAL DATA:  Pelvic pain EXAM: TRANSABDOMINAL AND TRANSVAGINAL ULTRASOUND OF PELVIS DOPPLER ULTRASOUND OF OVARIES TECHNIQUE: Both transabdominal and transvaginal ultrasound examinations of the pelvis were performed.  Transabdominal technique was performed for global imaging of the pelvis including uterus, ovaries, adnexal regions, and pelvic cul-de-sac. It was necessary to proceed with endovaginal exam following the transabdominal exam to visualize the endometrium and ovaries. Color and duplex Doppler ultrasound was utilized to evaluate blood flow to the ovaries. COMPARISON:  None. FINDINGS: Uterus Measurements: 8.5 x 4.3 x 5.0 cm = volume: 96 mL. No fibroids or other mass visualized. Endometrium Thickness: 9.  No focal abnormality visualized. Right ovary Measurements: 2.9 x 1.8 x 3.3 cm = volume: 9 mL. Normal appearance/no adnexal mass. Left ovary Measurements: 3.7 x 2.7 x 3.2 cm = volume: 16.7 mL. Normal appearance/no adnexal mass. Pulsed Doppler evaluation of both ovaries demonstrates normal low-resistance arterial and venous waveforms. Other findings Trace free fluid noted in the cul-de-sac. IMPRESSION: Unremarkable study. No findings to explain the patient's history of pain. Electronically Signed   By: Misty Stanley M.D.   On: 12/09/2017 20:04    EKG: Independently reviewed.  Sinus tachycardia borderline T wave changes.  Assessment/Plan Principal Problem:   Sepsis ([place]) Active Problems:   Hypothyroidism   Hypokalemia    1. Sepsis likely intra-abdominal cause suspect PID -CT abdomen pelvis is pending.  Follow blood cultures lactate procalcitonin level patient is placed on empiric antibiotics for PID at this time and also on Flagyl for clue cells being positive.  Vancomycin to be continued till blood culture results are available.  Continue with hydration. 2. History of papillary thyroid cancer status post thyroidectomy on Synthroid. 3. Mild hypokalemia replace and recheck. 4. Elevated LFTs follow LFTs check acute hepatitis panel follow CT abdomen pelvis and based on which we will have further plans.   DVT prophylaxis: Addendum  - Lovenox was started but since CT abdomen results shows abnormality in the  liver changed to SCDs.  Sonogram of the abdomen ordered. Code Status: Full code. Family Communication: Discussed with patient. Disposition Plan: Home. Consults called: None. Admission status: Inpatient.   Rise Patience MD [facility] Pager [phone].  If 7PM-7AM, please contact night-coverage [email or web address] Password [facility]  12/10/2017, 12:29 AM

## 2017-12-10 NOTE — Progress Notes (Signed)
PROGRESS NOTE                                                                                                                                                                                                             Patient Demographics:    Anne Newton, is a 27 y.o. female, DOB - Jun 29, 1990, FHL:[id number]  Admit date - 12/09/2017   Admitting Physician Rise Patience, MD  Outpatient Primary MD for the patient is Foye Spurling, MD  LOS - 1  Outpatient Specialists: none  Chief Complaint  Patient presents with  . Fever       Brief Narrative  27 year old female with papillary thyroid cancer status post thyroidectomy in 2014 on Synthroid replacement presented to the ED with fevers and chills since the morning of admission.  Patient reports that for the past 2 days she was having lower abdominal pain with some vaginal discharge.  Reported having monogamous relationship and unprotected sexual intercourse yesterday.  She denied any nausea, vomiting, diarrhea, shortness of breath, headache or chest discomfort.  Denies any recent travel or sick contact.  Denies any weight loss. In the ED patient was septic with fever of 103 F, hypotensive and tachycardic with WBC of 22K and elevated lactic acid.  Pelvic exam done by ED physician showed pelvic tenderness, wet prep showed clue cells.  UA and chest x-ray were unremarkable.  LFTs were mildly elevated.  Blood cultures obtained and placed on empiric antibiotics for suspected PID.  CT of the abdomen and pelvis showed small amount of pericholecystic fluid without any gallstones.  Also showed a 5.3 cm masslike attenuation in the right hepatic lobe.  Pelvic ultrasound/transvaginal ultrasound was negative for any acute findings. Patient admitted to stepdown unit for sepsis work-up.   Subjective:   Denies any abdominal pain this morning.  Blood pressure still low and getting IV fluids.  Assessment  & Plan :    Principal Problem: Severe sepsis with septic shock ([place]) Etiology unclear.  Concern for possible pelvic inflammatory disease versus?  Hepatic mass/abscess.  On empiric vancomycin, Cefotan, Flagyl and doxycycline (cover for sepsis and PID).  Will discontinue doxycycline for now. CT and ultrasound abdomen concerning for hepatic mass versus abscess.  No right upper quadrant tenderness or gallstones noted.  Will obtain MRI with contrast to further evaluate. Based on MRI finding  will consult surgery.  Lactic acid currently resolved but patient still septic.  Continue aggressive IV hydration.  Monitor procalcitonin.  Active Problems:   Hypothyroidism Status post thyroidectomy for papillary thyroid cancer.  On Synthroid.  Continue    Hypokalemia Replenished.  Transaminitis Mild.  Check hepatitis panel and MRI abdomen.    Code Status : Full code  Family Communication  : None at bedside  Disposition Plan  : Pending hospital course.  Continue stepdown monitoring  Barriers For Discharge : Active symptoms  Consults  : None  Procedures  : Pelvic ultrasound, CT abdomen, ultrasound abdomen, MRI abdomen  DVT Prophylaxis  :  Lovenox -   Lab Results  Component Value Date   PLT 188 12/10/2017    Antibiotics  :    Anti-infectives (From admission, onward)   Start     Dose/Rate Route Frequency Ordered Stop   12/10/17 0800  vancomycin (VANCOCIN) IVPB 750 mg/150 ml premix     750 mg 150 mL/hr over 60 Minutes Intravenous Every 12 hours 12/10/17 0631     12/10/17 0600  cefoTEtan (CEFOTAN) 2 g in sodium chloride 0.9 % 100 mL IVPB     2 g 200 mL/hr over 30 Minutes Intravenous Every 12 hours 12/10/17 0032     12/10/17 0045  doxycycline (VIBRAMYCIN) 100 mg in sodium chloride 0.9 % 250 mL IVPB     100 mg 125 mL/hr over 120 Minutes Intravenous Every 12 hours 12/10/17 0032     12/09/17 1715  ceFEPIme (MAXIPIME) 2 g in sodium chloride 0.9 % 100 mL IVPB     2 g 200 mL/hr  over 30 Minutes Intravenous  Once 12/09/17 1700 12/09/17 1759   12/09/17 1715  metroNIDAZOLE (FLAGYL) IVPB 500 mg     500 mg 100 mL/hr over 60 Minutes Intravenous Every 8 hours 12/09/17 1700     12/09/17 1715  vancomycin (VANCOCIN) IVPB 1000 mg/200 mL premix     1,000 mg 200 mL/hr over 60 Minutes Intravenous  Once 12/09/17 1700 12/09/17 1917        Objective:   Vitals:   12/10/17 0546 12/10/17 0600 12/10/17 0725 12/10/17 0800  BP: (!) 90/54 (!) 81/57 (!) 91/57   Pulse: 94 92 (!) 102   Resp:   (!) 26   Temp:    98.8 F (37.1 C)  TempSrc:    Oral  SpO2: 100% 100% 99%   Weight:      Height:        Wt Readings from Last 3 Encounters:  12/10/17 56 kg  11/16/17 52.1 kg  07/30/17 53.1 kg     Intake/Output Summary (Last 24 hours) at 12/10/2017 1028 Last data filed at 12/10/2017 0605 Gross per 24 hour  Intake 3990.43 ml  Output -  Net 3990.43 ml     Physical Exam  Gen: not in distress HEENT: no pallor, dry oral mucosa supple neck Chest: clear b/l, no added sounds CVS: N S1&S2, no murmurs, rubs or gallop GI: soft, NT, ND, BS+, negative Murphy sign Musculoskeletal: warm, no edema CNS: Alert and oriented, nonfocal    Data Review:    CBC Recent Labs  Lab 12/09/17 1649 12/10/17 0112 12/10/17 0305  WBC 8.6 22.0* 19.8*  HGB 13.8 10.8* 10.9*  HCT 45.2 36.8 35.3*  PLT 280 213 188  MCV 88.8 95.8 92.4  MCH 27.1 28.1 28.5  MCHC 30.5 29.3* 30.9  RDW 12.7 13.2 13.2  LYMPHSABS 0.5*  --  0.6*  MONOABS 0.0*  --  0.5  EOSABS 0.0  --  0.0  BASOSABS 0.0  --  0.1    Chemistries  Recent Labs  Lab 12/09/17 1649 12/10/17 0112 12/10/17 0305  NA 141  --  141  K 3.3*  --  3.7  CL 103  --  116*  CO2 24  --  19*  GLUCOSE 113*  --  126*  BUN 18  --  11  CREATININE 0.90 0.75 0.65  CALCIUM 9.4  --  7.3*  AST 77*  --  80*  ALT 50*  --  66*  ALKPHOS 104  --  74  BILITOT 1.6*  --  1.5*    ------------------------------------------------------------------------------------------------------------------ No results for input(s): CHOL, HDL, LDLCALC, TRIG, CHOLHDL, LDLDIRECT in the last 72 hours.  No results found for: HGBA1C ------------------------------------------------------------------------------------------------------------------ No results for input(s): TSH, T4TOTAL, T3FREE, THYROIDAB in the last 72 hours.  Invalid input(s): FREET3 ------------------------------------------------------------------------------------------------------------------ No results for input(s): VITAMINB12, FOLATE, FERRITIN, TIBC, IRON, RETICCTPCT in the last 72 hours.  Coagulation profile Recent Labs  Lab 12/09/17 1649 12/10/17 0730  INR 1.05 1.31    No results for input(s): DDIMER in the last 72 hours.  Cardiac Enzymes No results for input(s): CKMB, TROPONINI, MYOGLOBIN in the last 168 hours.  Invalid input(s): CK ------------------------------------------------------------------------------------------------------------------ No results found for: BNP  Inpatient Medications  Scheduled Meds: . levothyroxine  88 mcg Oral QAC breakfast   Continuous Infusions: . sodium chloride 100 mL/hr at 12/10/17 0605  . cefoTEtan (CEFOTAN) IV Stopped (12/10/17 7517)  . doxycycline (VIBRAMYCIN) IV Stopped (12/10/17 0422)  . metronidazole Stopped (12/10/17 0203)  . vancomycin     PRN Meds:.acetaminophen **OR** acetaminophen, albuterol, iohexol, iopamidol, ondansetron **OR** ondansetron (ZOFRAN) IV  Micro Results Recent Results (from the past 240 hour(s))  Group A Strep by PCR     Status: None   Collection Time: 12/09/17  4:58 PM  Result Value Ref Range Status   Group A Strep by PCR NOT DETECTED NOT DETECTED Final    Comment: Performed at [facility], [place] [address] [place], [place] [address]  Culture, blood (Routine x 2)     Status: None (Preliminary result)    Collection Time: 12/09/17  5:07 PM  Result Value Ref Range Status   Specimen Description   Final    RIGHT ANTECUBITAL Performed at [facility] [address], [place], [place] [address]    Special Requests   Final    BOTTLES DRAWN AEROBIC AND ANAEROBIC Blood Culture adequate volume   Culture PENDING  Incomplete   Report Status PENDING  Incomplete  Wet prep, genital     Status: Abnormal   Collection Time: 12/09/17  5:11 PM  Result Value Ref Range Status   Yeast Wet Prep HPF POC FEW (A) NONE SEEN Final   Trich, Wet Prep NONE SEEN NONE SEEN Final   Clue Cells Wet Prep HPF POC PRESENT (A) NONE SEEN Final   WBC, Wet Prep HPF POC MANY (A) NONE SEEN Final   Sperm NONE SEEN  Final    Comment: Performed at [facility], [place] [address], [place], [place] [address]  MRSA PCR Screening     Status: None   Collection Time: 12/10/17 12:12 AM  Result Value Ref Range Status   MRSA by PCR NEGATIVE NEGATIVE Final    Comment:        The GeneXpert MRSA Assay (FDA approved for NASAL specimens only), is one component of a comprehensive MRSA colonization surveillance program.  It is not intended to diagnose MRSA infection nor to guide or monitor treatment for MRSA infections. Performed at [facility], [place] [address], [place], [place] [address]     Radiology Reports Dg Chest 2 View  Result Date: 12/09/2017 CLINICAL DATA:  Possible sepsis EXAM: CHEST - 2 VIEW COMPARISON:  10/16/2012 FINDINGS: The heart size and mediastinal contours are within normal limits. Both lungs are clear. The visualized skeletal structures are unremarkable. IMPRESSION: No active cardiopulmonary disease. Electronically Signed   By: Donavan Foil M.D.   On: 12/09/2017 18:19   US Abdomen Complete  Result Date: 12/10/2017 CLINICAL DATA:  Elevated liver function studies, elevated white blood cell count. EXAM: ABDOMEN ULTRASOUND COMPLETE COMPARISON:  Abdominopelvic CT  scan of December 10, 2017 FINDINGS: Gallbladder: The gallbladder is adequately distended. There is irregular gallbladder wall thickening with maximal measurement of 3.8 mm. There is pericholecystic fluid. There is no positive sonographic Murphy's sign. No stones are observed. Common bile duct: Diameter: 2.4 mm Liver: In the right hepatic lobe posteriorly there is a cystic region corresponding to the focal hypo echo it hypodense region on the CT scan. It measures approximately 1.3 x 1.4 x 1.5.cm. There there is mixed echotexture surrounding the discrete cyst. Portal vein is patent on color Doppler imaging with normal direction of blood flow towards the liver. IVC: No abnormality visualized. Pancreas: Visualized portion unremarkable. Spleen: Size and appearance within normal limits. Right Kidney: Length: 11.6 cm. Echogenicity within normal limits. No mass or hydronephrosis visualized. Left Kidney: Length: 11.7 cm. Echogenicity within normal limits. No mass or hydronephrosis visualized. Abdominal aorta: No aneurysm visualized. Other findings: None. IMPRESSION: Gallbladder wall thickening and small amount of pericholecystic fluid. No positive sonographic Murphy's sign or evidence of stones. This may reflect subacute or chronic cholecystitis. Cystic appearing structure in the right hepatic lobe which corresponds to the CT finding. However, surrounding abnormal appearing parenchyma on the CT scan is evident on the ultrasound where there is a mixed echogenicity pattern. Neoplasm or hepatic abscess could be present and present in this manner. Again, MRI of the liver is recommended. No discrete ultrasonic abnormality in the upper pole of the right kidney. An area of subtle decreased density in the upper pole collecting system suggests a prominent calyx but is nonspecific. Again this too could be addressed on the above-mentioned MRI. Electronically Signed   By: David  Martinique M.D.   On: 12/10/2017 07:17   US Transvaginal  Non-ob  Result Date: 12/09/2017 CLINICAL DATA:  Pelvic pain EXAM: TRANSABDOMINAL AND TRANSVAGINAL ULTRASOUND OF PELVIS DOPPLER ULTRASOUND OF OVARIES TECHNIQUE: Both transabdominal and transvaginal ultrasound examinations of the pelvis were performed. Transabdominal technique was performed for global imaging of the pelvis including uterus, ovaries, adnexal regions, and pelvic cul-de-sac. It was necessary to proceed with endovaginal exam following the transabdominal exam to visualize the endometrium and ovaries. Color and duplex Doppler ultrasound was utilized to evaluate blood flow to the ovaries. COMPARISON:  None. FINDINGS: Uterus Measurements: 8.5 x 4.3 x 5.0 cm = volume: 96 mL. No fibroids or other mass visualized. Endometrium Thickness: 9.  No focal abnormality visualized. Right ovary Measurements: 2.9 x 1.8 x 3.3 cm = volume: 9 mL. Normal appearance/no adnexal mass. Left ovary Measurements: 3.7 x 2.7 x 3.2 cm = volume: 16.7 mL. Normal appearance/no adnexal mass. Pulsed Doppler evaluation of both ovaries demonstrates normal low-resistance arterial and venous waveforms. Other findings Trace free fluid noted in the cul-de-sac. IMPRESSION: Unremarkable study. No findings to explain the patient's  history of pain. Electronically Signed   By: Misty Stanley M.D.   On: 12/09/2017 20:04   US Pelvis Complete  Result Date: 12/09/2017 CLINICAL DATA:  Pelvic pain EXAM: TRANSABDOMINAL AND TRANSVAGINAL ULTRASOUND OF PELVIS DOPPLER ULTRASOUND OF OVARIES TECHNIQUE: Both transabdominal and transvaginal ultrasound examinations of the pelvis were performed. Transabdominal technique was performed for global imaging of the pelvis including uterus, ovaries, adnexal regions, and pelvic cul-de-sac. It was necessary to proceed with endovaginal exam following the transabdominal exam to visualize the endometrium and ovaries. Color and duplex Doppler ultrasound was utilized to evaluate blood flow to the ovaries. COMPARISON:  None.  FINDINGS: Uterus Measurements: 8.5 x 4.3 x 5.0 cm = volume: 96 mL. No fibroids or other mass visualized. Endometrium Thickness: 9.  No focal abnormality visualized. Right ovary Measurements: 2.9 x 1.8 x 3.3 cm = volume: 9 mL. Normal appearance/no adnexal mass. Left ovary Measurements: 3.7 x 2.7 x 3.2 cm = volume: 16.7 mL. Normal appearance/no adnexal mass. Pulsed Doppler evaluation of both ovaries demonstrates normal low-resistance arterial and venous waveforms. Other findings Trace free fluid noted in the cul-de-sac. IMPRESSION: Unremarkable study. No findings to explain the patient's history of pain. Electronically Signed   By: Misty Stanley M.D.   On: 12/09/2017 20:04   Ct Abdomen Pelvis W Contrast  Result Date: 12/10/2017 CLINICAL DATA:  Fever for couple of days. Lower abdominal pain. Vaginal pain. EXAM: CT ABDOMEN AND PELVIS WITH CONTRAST TECHNIQUE: Multidetector CT imaging of the abdomen and pelvis was performed using the standard protocol following bolus administration of intravenous contrast. CONTRAST:  132mL OMNIPAQUE IOHEXOL 300 MG/ML  SOLN COMPARISON:  None. FINDINGS: Lower chest: No acute abnormality. Hepatobiliary: Ill-defined region of mixed attenuation, primarily low-attenuation, in the right hepatic lobe. This abnormality measures at least 5.3 cm as seen on coronal image 54. Along the superior edge of this abnormality is a more discrete rounded low-attenuation mass measuring 16 mm on coronal image 49. Whether this more discrete masses part of the broader process are separate is unclear. Focal fatty deposition adjacent to the falciform ligament. No other liver abnormalities are noted. The portal vein is patent. Pericholecystic fluid is identified without an obvious stone. Pancreas: Unremarkable. No pancreatic ductal dilatation or surrounding inflammatory changes. Spleen: Normal in size without focal abnormality. Adrenals/Urinary Tract: A 15 mm low-attenuation mass in the superior right kidney  demonstrates an attenuation of 60 Hounsfield units. No other renal masses. No perinephric stranding or stones. No ureterectasis or ureteral stones. The bladder is unremarkable. The adrenal glands are normal. Stomach/Bowel: The stomach and small bowel are normal. The colon and appendix are normal. Vascular/Lymphatic: No significant vascular findings are present. No enlarged abdominal or pelvic lymph nodes. Reproductive: Uterus and bilateral adnexa are unremarkable. Other: There is a small amount of free fluid in the pelvis, likely physiologic. No free air. Musculoskeletal: No acute or significant osseous findings. IMPRESSION: 1. There is a small amount of peri cholecystic fluid. This raises the possibility of acute cholecystitis. However, no stones are seen. Recommend right upper quadrant ultrasound. 2. 5.3 cm masslike region of mixed attenuation in the right hepatic lobe. There is an adjacent 16 mm low-attenuation mass which could be part of the broader process or a separate mass. The findings are nonspecific but are suspicious for a neoplasm or neoplasms such as an adenoma. Recommended MRI for further evaluation. I doubt the hepatic findings are associated with the patient's acute symptoms and the MRI may be performed as an outpatient depending on the  appearance of the hepatic mass on the recommended ultrasound. 3. 15 mm low-attenuation nonspecific lesion in the right kidney. Recommend attention to this mass on the recommended MRI. 4. Small amount of free fluid in the pelvis is likely physiologic. Electronically Signed   By: Dorise Bullion III M.D   On: 12/10/2017 01:14   [place] Art/ven Flow Abd Pelv Doppler  Result Date: 12/09/2017 CLINICAL DATA:  Pelvic pain EXAM: TRANSABDOMINAL AND TRANSVAGINAL ULTRASOUND OF PELVIS DOPPLER ULTRASOUND OF OVARIES TECHNIQUE: Both transabdominal and transvaginal ultrasound examinations of the pelvis were performed. Transabdominal technique was performed for global imaging of the  pelvis including uterus, ovaries, adnexal regions, and pelvic cul-de-sac. It was necessary to proceed with endovaginal exam following the transabdominal exam to visualize the endometrium and ovaries. Color and duplex Doppler ultrasound was utilized to evaluate blood flow to the ovaries. COMPARISON:  None. FINDINGS: Uterus Measurements: 8.5 x 4.3 x 5.0 cm = volume: 96 mL. No fibroids or other mass visualized. Endometrium Thickness: 9.  No focal abnormality visualized. Right ovary Measurements: 2.9 x 1.8 x 3.3 cm = volume: 9 mL. Normal appearance/no adnexal mass. Left ovary Measurements: 3.7 x 2.7 x 3.2 cm = volume: 16.7 mL. Normal appearance/no adnexal mass. Pulsed Doppler evaluation of both ovaries demonstrates normal low-resistance arterial and venous waveforms. Other findings Trace free fluid noted in the cul-de-sac. IMPRESSION: Unremarkable study. No findings to explain the patient's history of pain. Electronically Signed   By: Misty Stanley M.D.   On: 12/09/2017 20:04    Time Spent in minutes  25   Krystyne Tewksbury M.D on 12/10/2017 at 10:28 AM  Between 7am to 7pm - Pager - [phone]  After 7pm go to [email or web address] - password [facility]  [facility] -  Office  [phone]

## 2017-12-11 ENCOUNTER — Inpatient Hospital Stay (HOSPITAL_COMMUNITY): Payer: Self-pay

## 2017-12-11 DIAGNOSIS — R7881 Bacteremia: Secondary | ICD-10-CM

## 2017-12-11 DIAGNOSIS — K769 Liver disease, unspecified: Secondary | ICD-10-CM

## 2017-12-11 LAB — BLOOD CULTURE ID PANEL (REFLEXED)

## 2017-12-11 LAB — COMPREHENSIVE METABOLIC PANEL WITH GFR
ALT: 49 U/L — ABNORMAL HIGH (ref 0–44)
AST: 36 U/L (ref 15–41)
Albumin: 3.1 g/dL — ABNORMAL LOW (ref 3.5–5.0)
Alkaline Phosphatase: 66 U/L (ref 38–126)
Anion gap: 8 (ref 5–15)
BUN: 5 mg/dL — ABNORMAL LOW (ref 6–20)
CO2: 19 mmol/L — ABNORMAL LOW (ref 22–32)
Calcium: 7.8 mg/dL — ABNORMAL LOW (ref 8.9–10.3)
Chloride: 113 mmol/L — ABNORMAL HIGH (ref 98–111)
Creatinine, Ser: 0.5 mg/dL (ref 0.44–1.00)
GFR calc Af Amer: >60 mL/min
GFR calc non Af Amer: >60 mL/min
Glucose, Bld: 103 mg/dL — ABNORMAL HIGH (ref 70–99)
Potassium: 3.2 mmol/L — ABNORMAL LOW (ref 3.5–5.1)
Sodium: 140 mmol/L (ref 135–145)
Total Bilirubin: 1.3 mg/dL — ABNORMAL HIGH (ref 0.3–1.2)
Total Protein: 6 g/dL — ABNORMAL LOW (ref 6.5–8.1)

## 2017-12-11 LAB — CBC
HCT: 34.5 % — ABNORMAL LOW (ref 36.0–46.0)
Hemoglobin: 10.7 g/dL — ABNORMAL LOW (ref 12.0–15.0)
MCH: 27.6 pg (ref 26.0–34.0)
MCHC: 31 g/dL (ref 30.0–36.0)
MCV: 88.9 fL (ref 80.0–100.0)
NRBC: 0 % (ref 0.0–0.2)
PLATELETS: 162 10*3/uL (ref 150–400)
RBC: 3.88 MIL/uL (ref 3.87–5.11)
RDW: 13.4 % (ref 11.5–15.5)
WBC: 13.1 10*3/uL — ABNORMAL HIGH (ref 4.0–10.5)

## 2017-12-11 LAB — HEPATITIS PANEL, ACUTE
HCV Ab: <0.1 s/co ratio (ref 0.0–0.9)
Hep A IgM: NEGATIVE
Hep B C IgM: NEGATIVE
Hepatitis B Surface Ag: NEGATIVE

## 2017-12-11 MED ORDER — MIDAZOLAM HCL 2 MG/2ML IJ SOLN
INTRAMUSCULAR | Status: AC
  Filled 2017-12-11: qty 2

## 2017-12-11 MED ORDER — MIDAZOLAM HCL 2 MG/2ML IJ SOLN
INTRAMUSCULAR | Status: AC | PRN
  Administered 2017-12-11: 1 mg via INTRAVENOUS

## 2017-12-11 MED ORDER — POTASSIUM CHLORIDE CRYS ER 20 MEQ PO TBCR
40.0000 meq | EXTENDED_RELEASE_TABLET | Freq: Once | ORAL | Status: AC
  Administered 2017-12-11: 40 meq via ORAL
  Filled 2017-12-11: qty 2

## 2017-12-11 MED ORDER — FENTANYL CITRATE (PF) 100 MCG/2ML IJ SOLN
INTRAMUSCULAR | Status: AC | PRN
  Administered 2017-12-11: 50 ug via INTRAVENOUS

## 2017-12-11 MED ORDER — SODIUM CHLORIDE 0.9 % IV SOLN
2.0000 g | INTRAVENOUS | Status: AC
  Administered 2017-12-11 – 2017-12-14 (×4): 2 g via INTRAVENOUS
  Filled 2017-12-11 (×4): qty 2

## 2017-12-11 MED ORDER — HYDROCODONE-ACETAMINOPHEN 5-325 MG PO TABS: 1.0000 | ORAL_TABLET | ORAL | Status: DC | PRN

## 2017-12-11 MED ORDER — ENOXAPARIN SODIUM 40 MG/0.4ML ~~LOC~~ SOLN
40.0000 mg | SUBCUTANEOUS | Status: DC
  Administered 2017-12-12 – 2017-12-14 (×3): 40 mg via SUBCUTANEOUS
  Filled 2017-12-11 (×3): qty 0.4

## 2017-12-11 MED ORDER — SODIUM CHLORIDE 0.9 % IV SOLN
INTRAVENOUS | Status: AC
  Administered 2017-12-11 – 2017-12-12 (×3): via INTRAVENOUS

## 2017-12-11 MED ORDER — FENTANYL CITRATE (PF) 100 MCG/2ML IJ SOLN
INTRAMUSCULAR | Status: AC
  Filled 2017-12-11: qty 2

## 2017-12-11 MED ORDER — LIDOCAINE HCL 1 % IJ SOLN
INTRAMUSCULAR | Status: AC
  Filled 2017-12-11: qty 20

## 2017-12-11 NOTE — Procedures (Signed)
  Procedure: US aspiration R liver lesion 54ml purulent, sent for GS, C&S EBL:   minimal Complications:  none immediate  See full dictation in [facility].  [place] Cannon MD Main # [phone] Pager  [phone]

## 2017-12-11 NOTE — Consult Note (Signed)
Chief Complaint: Patient was seen in consultation today for image guided aspiration of right liver lesion/fluid collection Chief Complaint  Patient presents with  . Fever    Referring Physician(s): Martin,M  Supervising Physician: Arne Cleveland  Patient Status: [facility] - In-pt  History of Present Illness: Anne Newton is a 27 y.o. female with history of papillary thyroid carcinoma status post thyroidectomy 2014 who was admitted to [facility] on 11/17 with reported fever, headache, sore throat, mild cough, pelvic pain/vaginal pain/discharge.  Laboratory studies revealed leukocytosis with WBC 13.1 today, slightly elevated ALT, total bilirubin 1.3, elevated lactic acid, wet prep with clue cells. CT abdomen/pelvis revealed small amount of pericholecystic fluid but no stones, 5.3 cm masslike region of mixed attenuation in the right hepatic lobe, 15 mm nonspecific lesion in the right kidney.  Ultrasound abdomen revealed gallbladder wall thickening and small amount of pericholecystic fluid but no sonographic Murphy's sign or stones,  cystic appearing structure in the right hepatic lobe. MRI abdomen performed yesterday revealed lesion within the posterior right hepatic lobe measuring up to 2 cm, abscess favored over tumor ,lesion arising from upper pole right kidney favored to represent a hemorrhagic or proteinaceous cyst.  Diffuse gallbladder wall edema.  Request now received from surgical team for image guided aspiration of the right liver lesion/fluid collection. She is currently on IV Flagyl and Rocephin.  Past Medical History:  Diagnosis Date  . Asthma    prn inhaler  . Hypothyroidism   . Nasal septal deviation 05/2017  . Nasal turbinate hypertrophy 05/2017    Past Surgical History:  Procedure Laterality Date  . SEPTOPLASTY N/A 06/30/2014   Procedure: SEPTOPLASTY;  Surgeon: Rozetta Nunnery, MD;  Location: [place];  Service: ENT;  Laterality: N/A;  .  SEPTOPLASTY N/A 06/26/2017   Procedure: SEPTOPLASTY;  Surgeon: Rozetta Nunnery, MD;  Location: [place];  Service: ENT;  Laterality: N/A;  . THYROIDECTOMY Left 08/28/2012   Procedure: LEFT THYROID LOBECTOMY;  Surgeon: Rozetta Nunnery, MD;  Location: [place];  Service: ENT;  Laterality: Left;  . THYROIDECTOMY Right 10/21/2012   Procedure: COMPLETION THYROIDECTOMY (RIGHT THYROID LOBECTOMY);  Surgeon: Rozetta Nunnery, MD;  Location: [place];  Service: ENT;  Laterality: Right;  . TURBINATE REDUCTION N/A 06/30/2014   Procedure: [place] REDUCTION;  Surgeon: Rozetta Nunnery, MD;  Location: [place];  Service: ENT;  Laterality: N/A;  . TURBINATE REDUCTION Bilateral 06/26/2017   Procedure: [place];  Surgeon: Rozetta Nunnery, MD;  Location: [place];  Service: ENT;  Laterality: Bilateral;    Allergies: Chicken allergy and Shrimp [shellfish allergy]  Medications: Prior to Admission medications   Medication Sig Start Date End Date Taking? Authorizing Provider  albuterol (PROVENTIL HFA;VENTOLIN HFA) 108 (90 Base) MCG/ACT inhaler Inhale 2 puffs into the lungs every 6 (six) hours as needed for wheezing. 02/12/17  Yes Tereasa Coop, PA-C  levothyroxine (SYNTHROID, LEVOTHROID) 88 MCG tablet Take 88 mcg by mouth daily before breakfast.   Yes [provider]  FLUoxetine (PROZAC) 20 MG tablet Take 1 tablet (20 mg total) by mouth daily. If no improvement after 3 weeks, start taking 2 pills/daily. Patient not taking: Reported on 11/16/2017 07/30/17   McVey, Gelene Mink, PA-C  levonorgestrel-ethinyl estradiol (AVIANE) 0.1-20 MG-MCG tablet Take 1 tablet by mouth daily. Patient not taking: Reported on 07/30/2017 07/04/17   McVey, Gelene Mink, PA-C  magic mouthwash w/lidocaine SOLN Take 10 mLs by mouth  every 2 (two) hours as needed for mouth pain. Patient not taking: Reported on 12/09/2017 11/16/17   Leonie Douglas, PA-C     Family History  Problem Relation Age of Onset  . Diabetes Mellitus II Neg Hx     Social History   Socioeconomic History  . Marital status: Married    Spouse name: Not on file  . Number of children: Not on file  . Years of education: Not on file  . Highest education level: Not on file  Occupational History  . Not on file  Social Needs  . Financial resource strain: Not on file  . Food insecurity:    Worry: Not on file    Inability: Not on file  . Transportation needs:    Medical: Not on file    Non-medical: Not on file  Tobacco Use  . Smoking status: Never Smoker  . Smokeless tobacco: Never Used  Substance and Sexual Activity  . Alcohol use: No  . Drug use: No  . Sexual activity: Yes    Birth control/protection: None  Lifestyle  . Physical activity:    Days per week: Not on file    Minutes per session: Not on file  . Stress: Not on file  Relationships  . Social connections:    Talks on phone: Not on file    Gets together: Not on file    Attends religious service: Not on file    Active member of club or organization: Not on file    Attends meetings of clubs or organizations: Not on file    Relationship status: Not on file  Other Topics Concern  . Not on file  Social History Narrative  . Not on file      Review of Systems currently denies fever, headache, chest pain, dyspnea, cough, abdominal/back pain, nausea, vomiting or bleeding.  Vital Signs: BP 95/73 (BP Location: Left Arm)   Pulse 92   Temp 98.5 F (36.9 C) (Oral)   Resp (!) 21   Ht 5\' 1"  (1.549 m)   Wt 120 lb 13 oz (54.8 kg)   LMP 11/16/2017 Comment: negative beta HCG 12/09/17  SpO2 99%   BMI 22.83 kg/m   Physical Exam awake, alert.  Chest clear to auscultation bilaterally.  Heart with regular rate and rhythm.  Abdomen soft, positive bowel sounds, nontender.  No lower extremity edema.  Imaging: Dg Chest 2 View  Result Date: 12/09/2017 CLINICAL DATA:  Possible sepsis  EXAM: CHEST - 2 VIEW COMPARISON:  10/16/2012 FINDINGS: The heart size and mediastinal contours are within normal limits. Both lungs are clear. The visualized skeletal structures are unremarkable. IMPRESSION: No active cardiopulmonary disease. Electronically Signed   By: Donavan Foil M.D.   On: 12/09/2017 18:19   Mr Abdomen W Wo Contrast  Result Date: 12/10/2017 CLINICAL DATA:  Fevers and chills.  Evaluate for liver abscess. EXAM: MRI ABDOMEN WITHOUT AND WITH CONTRAST TECHNIQUE: Multiplanar multisequence MR imaging of the abdomen was performed both before and after the administration of intravenous contrast. CONTRAST:  7 cc of Gadavist. COMPARISON:  12/10/2017 FINDINGS: Lower chest: Small pleural effusions. Hepatobiliary: Within the posterior right lobe of liver there is a mildly T2 hyperintense structure measuring 2 cm, image 49/802. There is mild peripheral hyper enhancement associated with this structure without enhancing internal soft tissue component. No additional focal liver abnormality identified. There is mild diffuse gallbladder wall edema, image 33/5. No gallstones identified. No biliary ductal dilatation. Pancreas: No mass, inflammatory changes, or  other parenchymal abnormality identified. Spleen:  Within normal limits in size and appearance. Adrenals/Urinary Tract: Normal appearance of the adrenal glands. Arising from the upper pole of the right kidney is a mild T1 hyperintense and T2 hypointense structure without internal enhancement, image 63/802. No hydronephrosis identified bilaterally. Stomach/Bowel: Stomach and the visualized upper abdominal bowel loops are unremarkable. Vascular/Lymphatic: No pathologically enlarged lymph nodes identified. No abdominal aortic aneurysm demonstrated. Other:  None. Musculoskeletal: No suspicious bone lesions identified. IMPRESSION: 1. Lesion within the posterior right lobe of liver is again noted with a maximum dimension of 2 cm. There is hyperemia of the  surrounding soft tissues. In the acute setting findings may represent a small liver abscess. Benign or malignant primary or metastatic liver lesion is considered less favored but not excluded. Further workup with tissue sampling/needle aspiration versus follow-up imaging after appropriate antibiotic therapy is recommended to confirm benignity. 2. Lesion arising from the upper pole of the right kidney is favored to represent a hemorrhagic/proteinaceous cyst. This is compatible with a benign, Bosniak category 2 lesion. 3. Diffuse gallbladder wall edema.  Cannot rule out cholecystitis. Electronically Signed   By: Kerby Moors M.D.   On: 12/10/2017 18:15   US Abdomen Complete  Result Date: 12/10/2017 CLINICAL DATA:  Elevated liver function studies, elevated white blood cell count. EXAM: ABDOMEN ULTRASOUND COMPLETE COMPARISON:  Abdominopelvic CT scan of December 10, 2017 FINDINGS: Gallbladder: The gallbladder is adequately distended. There is irregular gallbladder wall thickening with maximal measurement of 3.8 mm. There is pericholecystic fluid. There is no positive sonographic Murphy's sign. No stones are observed. Common bile duct: Diameter: 2.4 mm Liver: In the right hepatic lobe posteriorly there is a cystic region corresponding to the focal hypo echo it hypodense region on the CT scan. It measures approximately 1.3 x 1.4 x 1.5.cm. There there is mixed echotexture surrounding the discrete cyst. Portal vein is patent on color Doppler imaging with normal direction of blood flow towards the liver. IVC: No abnormality visualized. Pancreas: Visualized portion unremarkable. Spleen: Size and appearance within normal limits. Right Kidney: Length: 11.6 cm. Echogenicity within normal limits. No mass or hydronephrosis visualized. Left Kidney: Length: 11.7 cm. Echogenicity within normal limits. No mass or hydronephrosis visualized. Abdominal aorta: No aneurysm visualized. Other findings: None. IMPRESSION: Gallbladder  wall thickening and small amount of pericholecystic fluid. No positive sonographic Murphy's sign or evidence of stones. This may reflect subacute or chronic cholecystitis. Cystic appearing structure in the right hepatic lobe which corresponds to the CT finding. However, surrounding abnormal appearing parenchyma on the CT scan is evident on the ultrasound where there is a mixed echogenicity pattern. Neoplasm or hepatic abscess could be present and present in this manner. Again, MRI of the liver is recommended. No discrete ultrasonic abnormality in the upper pole of the right kidney. An area of subtle decreased density in the upper pole collecting system suggests a prominent calyx but is nonspecific. Again this too could be addressed on the above-mentioned MRI. Electronically Signed   By: David  Martinique M.D.   On: 12/10/2017 07:17   US Transvaginal Non-ob  Result Date: 12/09/2017 CLINICAL DATA:  Pelvic pain EXAM: TRANSABDOMINAL AND TRANSVAGINAL ULTRASOUND OF PELVIS DOPPLER ULTRASOUND OF OVARIES TECHNIQUE: Both transabdominal and transvaginal ultrasound examinations of the pelvis were performed. Transabdominal technique was performed for global imaging of the pelvis including uterus, ovaries, adnexal regions, and pelvic cul-de-sac. It was necessary to proceed with endovaginal exam following the transabdominal exam to visualize the endometrium and ovaries. Color and  duplex Doppler ultrasound was utilized to evaluate blood flow to the ovaries. COMPARISON:  None. FINDINGS: Uterus Measurements: 8.5 x 4.3 x 5.0 cm = volume: 96 mL. No fibroids or other mass visualized. Endometrium Thickness: 9.  No focal abnormality visualized. Right ovary Measurements: 2.9 x 1.8 x 3.3 cm = volume: 9 mL. Normal appearance/no adnexal mass. Left ovary Measurements: 3.7 x 2.7 x 3.2 cm = volume: 16.7 mL. Normal appearance/no adnexal mass. Pulsed Doppler evaluation of both ovaries demonstrates normal low-resistance arterial and venous  waveforms. Other findings Trace free fluid noted in the cul-de-sac. IMPRESSION: Unremarkable study. No findings to explain the patient's history of pain. Electronically Signed   By: Misty Stanley M.D.   On: 12/09/2017 20:04   US Pelvis Complete  Result Date: 12/09/2017 CLINICAL DATA:  Pelvic pain EXAM: TRANSABDOMINAL AND TRANSVAGINAL ULTRASOUND OF PELVIS DOPPLER ULTRASOUND OF OVARIES TECHNIQUE: Both transabdominal and transvaginal ultrasound examinations of the pelvis were performed. Transabdominal technique was performed for global imaging of the pelvis including uterus, ovaries, adnexal regions, and pelvic cul-de-sac. It was necessary to proceed with endovaginal exam following the transabdominal exam to visualize the endometrium and ovaries. Color and duplex Doppler ultrasound was utilized to evaluate blood flow to the ovaries. COMPARISON:  None. FINDINGS: Uterus Measurements: 8.5 x 4.3 x 5.0 cm = volume: 96 mL. No fibroids or other mass visualized. Endometrium Thickness: 9.  No focal abnormality visualized. Right ovary Measurements: 2.9 x 1.8 x 3.3 cm = volume: 9 mL. Normal appearance/no adnexal mass. Left ovary Measurements: 3.7 x 2.7 x 3.2 cm = volume: 16.7 mL. Normal appearance/no adnexal mass. Pulsed Doppler evaluation of both ovaries demonstrates normal low-resistance arterial and venous waveforms. Other findings Trace free fluid noted in the cul-de-sac. IMPRESSION: Unremarkable study. No findings to explain the patient's history of pain. Electronically Signed   By: Misty Stanley M.D.   On: 12/09/2017 20:04   Ct Abdomen Pelvis W Contrast  Result Date: 12/10/2017 CLINICAL DATA:  Fever for couple of days. Lower abdominal pain. Vaginal pain. EXAM: CT ABDOMEN AND PELVIS WITH CONTRAST TECHNIQUE: Multidetector CT imaging of the abdomen and pelvis was performed using the standard protocol following bolus administration of intravenous contrast. CONTRAST:  145mL OMNIPAQUE IOHEXOL 300 MG/ML  SOLN COMPARISON:   None. FINDINGS: Lower chest: No acute abnormality. Hepatobiliary: Ill-defined region of mixed attenuation, primarily low-attenuation, in the right hepatic lobe. This abnormality measures at least 5.3 cm as seen on coronal image 54. Along the superior edge of this abnormality is a more discrete rounded low-attenuation mass measuring 16 mm on coronal image 49. Whether this more discrete masses part of the broader process are separate is unclear. Focal fatty deposition adjacent to the falciform ligament. No other liver abnormalities are noted. The portal vein is patent. Pericholecystic fluid is identified without an obvious stone. Pancreas: Unremarkable. No pancreatic ductal dilatation or surrounding inflammatory changes. Spleen: Normal in size without focal abnormality. Adrenals/Urinary Tract: A 15 mm low-attenuation mass in the superior right kidney demonstrates an attenuation of 60 Hounsfield units. No other renal masses. No perinephric stranding or stones. No ureterectasis or ureteral stones. The bladder is unremarkable. The adrenal glands are normal. Stomach/Bowel: The stomach and small bowel are normal. The colon and appendix are normal. Vascular/Lymphatic: No significant vascular findings are present. No enlarged abdominal or pelvic lymph nodes. Reproductive: Uterus and bilateral adnexa are unremarkable. Other: There is a small amount of free fluid in the pelvis, likely physiologic. No free air. Musculoskeletal: No acute or significant osseous  findings. IMPRESSION: 1. There is a small amount of peri cholecystic fluid. This raises the possibility of acute cholecystitis. However, no stones are seen. Recommend right upper quadrant ultrasound. 2. 5.3 cm masslike region of mixed attenuation in the right hepatic lobe. There is an adjacent 16 mm low-attenuation mass which could be part of the broader process or a separate mass. The findings are nonspecific but are suspicious for a neoplasm or neoplasms such as an  adenoma. Recommended MRI for further evaluation. I doubt the hepatic findings are associated with the patient's acute symptoms and the MRI may be performed as an outpatient depending on the appearance of the hepatic mass on the recommended ultrasound. 3. 15 mm low-attenuation nonspecific lesion in the right kidney. Recommend attention to this mass on the recommended MRI. 4. Small amount of free fluid in the pelvis is likely physiologic. Electronically Signed   By: Dorise Bullion III M.D   On: 12/10/2017 01:14   [place] Art/ven Flow Abd Pelv Doppler  Result Date: 12/09/2017 CLINICAL DATA:  Pelvic pain EXAM: TRANSABDOMINAL AND TRANSVAGINAL ULTRASOUND OF PELVIS DOPPLER ULTRASOUND OF OVARIES TECHNIQUE: Both transabdominal and transvaginal ultrasound examinations of the pelvis were performed. Transabdominal technique was performed for global imaging of the pelvis including uterus, ovaries, adnexal regions, and pelvic cul-de-sac. It was necessary to proceed with endovaginal exam following the transabdominal exam to visualize the endometrium and ovaries. Color and duplex Doppler ultrasound was utilized to evaluate blood flow to the ovaries. COMPARISON:  None. FINDINGS: Uterus Measurements: 8.5 x 4.3 x 5.0 cm = volume: 96 mL. No fibroids or other mass visualized. Endometrium Thickness: 9.  No focal abnormality visualized. Right ovary Measurements: 2.9 x 1.8 x 3.3 cm = volume: 9 mL. Normal appearance/no adnexal mass. Left ovary Measurements: 3.7 x 2.7 x 3.2 cm = volume: 16.7 mL. Normal appearance/no adnexal mass. Pulsed Doppler evaluation of both ovaries demonstrates normal low-resistance arterial and venous waveforms. Other findings Trace free fluid noted in the cul-de-sac. IMPRESSION: Unremarkable study. No findings to explain the patient's history of pain. Electronically Signed   By: Misty Stanley M.D.   On: 12/09/2017 20:04    Labs:  CBC: Recent Labs    12/09/17 1649 12/10/17 0112 12/10/17 0305 12/11/17 0235    WBC 8.6 22.0* 19.8* 13.1*  HGB 13.8 10.8* 10.9* 10.7*  HCT 45.2 36.8 35.3* 34.5*  PLT 280 213 188 162    COAGS: Recent Labs    12/09/17 1649 12/10/17 0730  INR 1.05 1.31    BMP: Recent Labs    01/29/17 1233 12/09/17 1649 12/10/17 0112 12/10/17 0305 12/11/17 0235  NA 143 141  --  141 140  K 4.0 3.3*  --  3.7 3.2*  CL 101 103  --  116* 113*  CO2 22 24  --  19* 19*  GLUCOSE 90 113*  --  126* 103*  BUN 10 18  --  11 5*  CALCIUM 10.1 9.4  --  7.3* 7.8*  CREATININE 0.79 0.90 0.75 0.65 0.50  GFRNONAA 104 >60 >60 >60 >60  GFRAA 120 >60 >60 >60 >60    LIVER FUNCTION TESTS: Recent Labs    01/29/17 1233 12/09/17 1649 12/10/17 0305 12/11/17 0235  BILITOT 1.3* 1.6* 1.5* 1.3*  AST 16 77* 80* 36  ALT 15 50* 66* 49*  ALKPHOS 59 104 74 66  PROT 8.2 8.4* 5.8* 6.0*  ALBUMIN 5.1 4.3 2.8* 3.1*    TUMOR MARKERS: No results for input(s): AFPTM, CEA, CA199, CHROMGRNA in the  last 8760 hours.  Assessment and Plan: 27 y.o. female with history of papillary thyroid carcinoma status post thyroidectomy 2014 who was admitted to [facility] on 11/17 with reported fever, headache, sore throat, mild cough, pelvic pain/vaginal pain/discharge.  Laboratory studies revealed leukocytosis with WBC 13.1 today, slightly elevated ALT, total bilirubin 1.3, elevated lactic acid, wet prep with clue cells. CT abdomen/pelvis revealed small amount of pericholecystic fluid but no stones, 5.3 cm masslike region of mixed attenuation in the right hepatic lobe, 15 mm nonspecific lesion in the right kidney.  Ultrasound abdomen revealed gallbladder wall thickening and small amount of pericholecystic fluid but no sonographic Murphy's sign or stones,  cystic appearing structure in the right hepatic lobe. MRI abdomen performed yesterday revealed lesion within the posterior right hepatic lobe measuring up to 2 cm, abscess favored over tumor ,lesion arising from upper pole right kidney favored to represent a  hemorrhagic or proteinaceous cyst.  Diffuse gallbladder wall edema.  Request now received from surgical team for image guided aspiration of the right liver lesion/fluid collection. She is currently on IV Flagyl and Rocephin.  Imaging studies have been reviewed by Dr. Vernard Gambles.Risks and benefits discussed with the patient/sig other including, but not limited to bleeding, infection, damage to adjacent structures or low yield requiring additional tests.  All of the patient's questions were answered, patient is agreeable to proceed. Consent signed and in chart.  Procedure tent scheduled for later today   Thank you for this interesting consult.  I greatly enjoyed meeting SHANNIA JACUINDE and look forward to participating in their care.  A copy of this report was sent to the requesting provider on this date.  Electronically Signed: D. Rowe Robert, PA-C 12/11/2017, 11:03 AM   I spent a total of 30 minutes    in face to face in clinical consultation, greater than 50% of which was counseling/coordinating care for image guided liver lesion/fluid collection aspiration

## 2017-12-11 NOTE — Care Management Note (Signed)
Case Management Note  Patient Details  Name: Anne Newton MRN: [id number] Date of Birth: 11-12-1990  Subjective/Objective:                  Abdominal pain, leukocytosis, mild LFT elevation. Right posterior liver lobe lesion/possible abscess  Possible cholecystitis History of right thyroid cancer with thyroidectomy -on Synthroid Asthma -PRN inhaler  Action/Plan: Will follow for progression of care and clinical status. Will follow for case management needs none present at this time.  Expected Discharge Date:                  Expected Discharge Plan:  Home/Self Care  In-House Referral:     Discharge planning Services  CM Consult  Post Acute Care Choice:    Choice offered to:     DME Arranged:    DME Agency:     HH Arranged:    HH Agency:     Status of Service:  In process, will continue to follow  If discussed at Long Length of Stay Meetings, dates discussed:    Additional Comments:  Leeroy Cha, RN 12/11/2017, 10:27 AM

## 2017-12-11 NOTE — Progress Notes (Signed)
PROGRESS NOTE                                                                                                                                                                                                             Patient Demographics:    Anne Newton, is a 27 y.o. female, DOB - January 03, 1991, WEX:[id number]  Admit date - 12/09/2017   Admitting Physician Rise Patience, MD  Outpatient Primary MD for the patient is Foye Spurling, MD  LOS - 2  Outpatient Specialists: none  Chief Complaint  Patient presents with  . Fever       Brief Narrative  27 year old female with papillary thyroid cancer status post thyroidectomy in 2014 on Synthroid replacement presented to the ED with fevers and chills since the morning of admission.  Patient reports that for the past 2 days she was having lower abdominal pain with some vaginal discharge.  Reported having monogamous relationship and unprotected sexual intercourse yesterday.  She denied any nausea, vomiting, diarrhea, shortness of breath, headache or chest discomfort.  Denies any recent travel or sick contact.  Denies any weight loss. In the ED patient was septic with fever of 103 F, hypotensive and tachycardic with WBC of 22K and elevated lactic acid.  Pelvic exam done by ED physician showed pelvic tenderness, wet prep showed clue cells.  UA and chest x-ray were unremarkable.  LFTs were mildly elevated.  Blood cultures obtained and placed on empiric antibiotics for suspected PID.  CT of the abdomen and pelvis showed small amount of pericholecystic fluid without any gallstones.  Also showed a 5.3 cm masslike attenuation in the right hepatic lobe.  Pelvic ultrasound/transvaginal ultrasound was negative for any acute findings. Patient admitted to stepdown unit for sepsis work-up.   Subjective:   Denies further abdominal pain.  Low-grade fever overnight.  Blood pressure improved with IV  fluids.  Assessment  & Plan :    Principal Problem: Severe sepsis with septic shock ([place]) Etiology unclear.  possible pelvic inflammatory disease versus Hepatic mass/abscess.  Antibiotic switched to IV Rocephin and Flagyl to cover for both her abscess and PID.   Blood culture on admission growing gram-negative rods.  Sensitivity pending.  CT and ultrasound abdomen concerning for hepatic mass versus abscess.  No right upper quadrant tenderness or gallstones noted.  MRI abdomen done showing  2 cm liver abscess lower right hepatic lobe along with diffuse gallbladder wall edema.  Sepsis resolving.  Continue IV hydration, empiric antibiotic.  Surgery consulted who recommended IR consult for aspiration of the liver lesion.  This is planned for today.  Keep n.p.o. for now.  Stable to be transferred to medical floor.   Active Problems:   Hypothyroidism Status post thyroidectomy for papillary thyroid cancer.  On Synthroid which is continued    Hypokalemia Replenished.  Transaminitis Mild.  Improved in a.m. lab.  Hepatitis panel negative.    Code Status : Full code  Family Communication  : Boyfriend at bedside  Disposition Plan  : Transfer to medical floor.  Home pending further work-up.  Barriers For Discharge : Active symptoms  Consults  : None  Procedures  : Pelvic ultrasound, CT abdomen, ultrasound abdomen, MRI abdomen  DVT Prophylaxis  :  Lovenox -   Lab Results  Component Value Date   PLT 162 12/11/2017    Antibiotics  :    Anti-infectives (From admission, onward)   Start     Dose/Rate Route Frequency Ordered Stop   12/11/17 1200  cefTRIAXone (ROCEPHIN) 2 g in sodium chloride 0.9 % 100 mL IVPB     2 g 200 mL/hr over 30 Minutes Intravenous Every 24 hours 12/11/17 0820     12/10/17 0800  vancomycin (VANCOCIN) IVPB 750 mg/150 ml premix  Status:  Discontinued     750 mg 150 mL/hr over 60 Minutes Intravenous Every 12 hours 12/10/17 0631 12/11/17 0820   12/10/17 0600   cefoTEtan (CEFOTAN) 2 g in sodium chloride 0.9 % 100 mL IVPB  Status:  Discontinued     2 g 200 mL/hr over 30 Minutes Intravenous Every 12 hours 12/10/17 0032 12/11/17 0820   12/10/17 0045  doxycycline (VIBRAMYCIN) 100 mg in sodium chloride 0.9 % 250 mL IVPB  Status:  Discontinued     100 mg 125 mL/hr over 120 Minutes Intravenous Every 12 hours 12/10/17 0032 12/11/17 0729   12/09/17 1715  ceFEPIme (MAXIPIME) 2 g in sodium chloride 0.9 % 100 mL IVPB     2 g 200 mL/hr over 30 Minutes Intravenous  Once 12/09/17 1700 12/09/17 1759   12/09/17 1715  metroNIDAZOLE (FLAGYL) IVPB 500 mg     500 mg 100 mL/hr over 60 Minutes Intravenous Every 8 hours 12/09/17 1700     12/09/17 1715  vancomycin (VANCOCIN) IVPB 1000 mg/200 mL premix     1,000 mg 200 mL/hr over 60 Minutes Intravenous  Once 12/09/17 1700 12/09/17 1917        Objective:   Vitals:   12/11/17 0600 12/11/17 0715 12/11/17 0800 12/11/17 1052  BP: 107/62  101/62 95/73  Pulse:    92  Resp: (!) 24  (!) 21   Temp:  99.9 F (37.7 C)  98.5 F (36.9 C)  TempSrc:  Oral  Oral  SpO2:    99%  Weight:      Height:        Wt Readings from Last 3 Encounters:  12/11/17 54.8 kg  11/16/17 52.1 kg  07/30/17 53.1 kg     Intake/Output Summary (Last 24 hours) at 12/11/2017 1123 Last data filed at 12/11/2017 0702 Gross per 24 hour  Intake 2477.11 ml  Output -  Net 2477.11 ml     Physical Exam  Gen: not in distress HEENT: Moist mucosa, supple neck Chest: clear b/l, no added sounds CVS: N S1&S2, no murmurs GI: soft, NT, ND,  BS+,  Musculoskeletal: warm, no edema     Data Review:    CBC Recent Labs  Lab 12/09/17 1649 12/10/17 0112 12/10/17 0305 12/11/17 0235  WBC 8.6 22.0* 19.8* 13.1*  HGB 13.8 10.8* 10.9* 10.7*  HCT 45.2 36.8 35.3* 34.5*  PLT 280 213 188 162  MCV 88.8 95.8 92.4 88.9  MCH 27.1 28.1 28.5 27.6  MCHC 30.5 29.3* 30.9 31.0  RDW 12.7 13.2 13.2 13.4  LYMPHSABS 0.5*  --  0.6*  --   MONOABS 0.0*  --  0.5  --    EOSABS 0.0  --  0.0  --   BASOSABS 0.0  --  0.1  --     Chemistries  Recent Labs  Lab 12/09/17 1649 12/10/17 0112 12/10/17 0305 12/11/17 0235  NA 141  --  141 140  K 3.3*  --  3.7 3.2*  CL 103  --  116* 113*  CO2 24  --  19* 19*  GLUCOSE 113*  --  126* 103*  BUN 18  --  11 5*  CREATININE 0.90 0.75 0.65 0.50  CALCIUM 9.4  --  7.3* 7.8*  AST 77*  --  80* 36  ALT 50*  --  66* 49*  ALKPHOS 104  --  74 66  BILITOT 1.6*  --  1.5* 1.3*   ------------------------------------------------------------------------------------------------------------------ No results for input(s): CHOL, HDL, LDLCALC, TRIG, CHOLHDL, LDLDIRECT in the last 72 hours.  No results found for: HGBA1C ------------------------------------------------------------------------------------------------------------------ No results for input(s): TSH, T4TOTAL, T3FREE, THYROIDAB in the last 72 hours.  Invalid input(s): FREET3 ------------------------------------------------------------------------------------------------------------------ No results for input(s): VITAMINB12, FOLATE, FERRITIN, TIBC, IRON, RETICCTPCT in the last 72 hours.  Coagulation profile Recent Labs  Lab 12/09/17 1649 12/10/17 0730  INR 1.05 1.31    No results for input(s): DDIMER in the last 72 hours.  Cardiac Enzymes No results for input(s): CKMB, TROPONINI, MYOGLOBIN in the last 168 hours.  Invalid input(s): CK ------------------------------------------------------------------------------------------------------------------ No results found for: BNP  Inpatient Medications  Scheduled Meds: . levothyroxine  88 mcg Oral QAC breakfast   Continuous Infusions: . sodium chloride    . cefTRIAXone (ROCEPHIN)  IV    . metronidazole 500 mg (12/11/17 0954)   PRN Meds:.acetaminophen **OR** acetaminophen, albuterol, iohexol, iopamidol, ondansetron **OR** ondansetron (ZOFRAN) IV  Micro Results Recent Results (from the past 240 hour(s))    Culture, blood (Routine x 2)     Status: None (Preliminary result)   Collection Time: 12/09/17  4:49 PM  Result Value Ref Range Status   Specimen Description   Final    LEFT ANTECUBITAL Performed at [facility] [address] [place], [place] [address]    Special Requests   Final    BOTTLES DRAWN AEROBIC AND ANAEROBIC Blood Culture results may not be optimal due to an excessive volume of blood received in culture bottles Performed at [facility] [address], [place], [place] [address]    Culture  Setup Time   Final    GRAM NEGATIVE RODS ANAEROBIC BOTTLE ONLY CRITICAL RESULT CALLED TO, READ BACK BY AND VERIFIED WITH: Colin Rhein PHARMD [id number] 12/11/17 A BROWNING    Culture   Final    CULTURE REINCUBATED FOR BETTER GROWTH Performed at [facility], [place] [address], [place], [place] [address]    Report Status PENDING  Incomplete  Group A Strep by PCR     Status: None   Collection Time: 12/09/17  4:58 PM  Result Value Ref Range  Status   Group A Strep by PCR NOT DETECTED NOT DETECTED Final    Comment: Performed at [facility] [address], [place], [place] [address]  Culture, blood (Routine x 2)     Status: None (Preliminary result)   Collection Time: 12/09/17  5:07 PM  Result Value Ref Range Status   Specimen Description   Final    RIGHT ANTECUBITAL Performed at [facility] [address] [place], [place] [address]    Special Requests   Final    BOTTLES DRAWN AEROBIC AND ANAEROBIC Blood Culture adequate volume   Culture  Setup Time   Final    ANAEROBIC BOTTLE ONLY GRAM NEGATIVE RODS CRITICAL RESULT CALLED TO, READ BACK BY AND VERIFIED WITH: Colin Rhein PHARMD [id number] 12/11/17 A BROWNING    Culture   Final    CULTURE REINCUBATED FOR BETTER GROWTH Performed at [facility], [place] [address], [place], [place] [address]    Report Status PENDING  Incomplete  Blood Culture ID Panel (Reflexed)      Status: None   Collection Time: 12/09/17  5:07 PM  Result Value Ref Range Status   Enterococcus species NOT DETECTED NOT DETECTED Final   Listeria monocytogenes NOT DETECTED NOT DETECTED Final   Staphylococcus species NOT DETECTED NOT DETECTED Final   Staphylococcus aureus (BCID) NOT DETECTED NOT DETECTED Final   Streptococcus species NOT DETECTED NOT DETECTED Final   Streptococcus agalactiae NOT DETECTED NOT DETECTED Final   Streptococcus pneumoniae NOT DETECTED NOT DETECTED Final   Streptococcus pyogenes NOT DETECTED NOT DETECTED Final   Acinetobacter baumannii NOT DETECTED NOT DETECTED Final   Enterobacteriaceae species NOT DETECTED NOT DETECTED Final   Enterobacter cloacae complex NOT DETECTED NOT DETECTED Final   Escherichia coli NOT DETECTED NOT DETECTED Final   Klebsiella oxytoca NOT DETECTED NOT DETECTED Final   Klebsiella pneumoniae NOT DETECTED NOT DETECTED Final   Proteus species NOT DETECTED NOT DETECTED Final   Serratia marcescens NOT DETECTED NOT DETECTED Final   Haemophilus influenzae NOT DETECTED NOT DETECTED Final   Neisseria meningitidis NOT DETECTED NOT DETECTED Final   Pseudomonas aeruginosa NOT DETECTED NOT DETECTED Final   Candida albicans NOT DETECTED NOT DETECTED Final   Candida glabrata NOT DETECTED NOT DETECTED Final   Candida krusei NOT DETECTED NOT DETECTED Final   Candida parapsilosis NOT DETECTED NOT DETECTED Final   Candida tropicalis NOT DETECTED NOT DETECTED Final    Comment: Performed at [facility], [place] [address], [place], [place] [address]  Wet prep, genital     Status: Abnormal   Collection Time: 12/09/17  5:11 PM  Result Value Ref Range Status   Yeast Wet Prep HPF POC FEW (A) NONE SEEN Final   Trich, Wet Prep NONE SEEN NONE SEEN Final   Clue Cells Wet Prep HPF POC PRESENT (A) NONE SEEN Final   WBC, Wet Prep HPF POC MANY (A) NONE SEEN Final   Sperm NONE SEEN  Final    Comment: Performed at [facility], [place]  [address] [place],  [address]  MRSA PCR Screening     Status: None   Collection Time: 12/10/17 12:12 AM  Result Value Ref Range Status   MRSA by PCR NEGATIVE NEGATIVE Final    Comment:        The GeneXpert MRSA Assay (FDA approved for NASAL specimens only), is one component of a comprehensive MRSA colonization surveillance program. It is not intended to diagnose  MRSA infection nor to guide or monitor treatment for MRSA infections. Performed at [facility], [place] [address], [place], [place] [address]     Radiology Reports Dg Chest 2 View  Result Date: 12/09/2017 CLINICAL DATA:  Possible sepsis EXAM: CHEST - 2 VIEW COMPARISON:  10/16/2012 FINDINGS: The heart size and mediastinal contours are within normal limits. Both lungs are clear. The visualized skeletal structures are unremarkable. IMPRESSION: No active cardiopulmonary disease. Electronically Signed   By: Donavan Foil M.D.   On: 12/09/2017 18:19   Mr Abdomen W Wo Contrast  Result Date: 12/10/2017 CLINICAL DATA:  Fevers and chills.  Evaluate for liver abscess. EXAM: MRI ABDOMEN WITHOUT AND WITH CONTRAST TECHNIQUE: Multiplanar multisequence MR imaging of the abdomen was performed both before and after the administration of intravenous contrast. CONTRAST:  7 cc of Gadavist. COMPARISON:  12/10/2017 FINDINGS: Lower chest: Small pleural effusions. Hepatobiliary: Within the posterior right lobe of liver there is a mildly T2 hyperintense structure measuring 2 cm, image 49/802. There is mild peripheral hyper enhancement associated with this structure without enhancing internal soft tissue component. No additional focal liver abnormality identified. There is mild diffuse gallbladder wall edema, image 33/5. No gallstones identified. No biliary ductal dilatation. Pancreas: No mass, inflammatory changes, or other parenchymal abnormality identified. Spleen:  Within normal limits in size and appearance. Adrenals/Urinary  Tract: Normal appearance of the adrenal glands. Arising from the upper pole of the right kidney is a mild T1 hyperintense and T2 hypointense structure without internal enhancement, image 63/802. No hydronephrosis identified bilaterally. Stomach/Bowel: Stomach and the visualized upper abdominal bowel loops are unremarkable. Vascular/Lymphatic: No pathologically enlarged lymph nodes identified. No abdominal aortic aneurysm demonstrated. Other:  None. Musculoskeletal: No suspicious bone lesions identified. IMPRESSION: 1. Lesion within the posterior right lobe of liver is again noted with a maximum dimension of 2 cm. There is hyperemia of the surrounding soft tissues. In the acute setting findings may represent a small liver abscess. Benign or malignant primary or metastatic liver lesion is considered less favored but not excluded. Further workup with tissue sampling/needle aspiration versus follow-up imaging after appropriate antibiotic therapy is recommended to confirm benignity. 2. Lesion arising from the upper pole of the right kidney is favored to represent a hemorrhagic/proteinaceous cyst. This is compatible with a benign, Bosniak category 2 lesion. 3. Diffuse gallbladder wall edema.  Cannot rule out cholecystitis. Electronically Signed   By: Kerby Moors M.D.   On: 12/10/2017 18:15   US Abdomen Complete  Result Date: 12/10/2017 CLINICAL DATA:  Elevated liver function studies, elevated white blood cell count. EXAM: ABDOMEN ULTRASOUND COMPLETE COMPARISON:  Abdominopelvic CT scan of December 10, 2017 FINDINGS: Gallbladder: The gallbladder is adequately distended. There is irregular gallbladder wall thickening with maximal measurement of 3.8 mm. There is pericholecystic fluid. There is no positive sonographic Murphy's sign. No stones are observed. Common bile duct: Diameter: 2.4 mm Liver: In the right hepatic lobe posteriorly there is a cystic region corresponding to the focal hypo echo it hypodense region on  the CT scan. It measures approximately 1.3 x 1.4 x 1.5.cm. There there is mixed echotexture surrounding the discrete cyst. Portal vein is patent on color Doppler imaging with normal direction of blood flow towards the liver. IVC: No abnormality visualized. Pancreas: Visualized portion unremarkable. Spleen: Size and appearance within normal limits. Right Kidney: Length: 11.6 cm. Echogenicity within normal limits. No mass or hydronephrosis visualized. Left Kidney: Length: 11.7 cm. Echogenicity within normal limits. No mass or hydronephrosis visualized.  Abdominal aorta: No aneurysm visualized. Other findings: None. IMPRESSION: Gallbladder wall thickening and small amount of pericholecystic fluid. No positive sonographic Murphy's sign or evidence of stones. This may reflect subacute or chronic cholecystitis. Cystic appearing structure in the right hepatic lobe which corresponds to the CT finding. However, surrounding abnormal appearing parenchyma on the CT scan is evident on the ultrasound where there is a mixed echogenicity pattern. Neoplasm or hepatic abscess could be present and present in this manner. Again, MRI of the liver is recommended. No discrete ultrasonic abnormality in the upper pole of the right kidney. An area of subtle decreased density in the upper pole collecting system suggests a prominent calyx but is nonspecific. Again this too could be addressed on the above-mentioned MRI. Electronically Signed   By: David  Martinique M.D.   On: 12/10/2017 07:17   US Transvaginal Non-ob  Result Date: 12/09/2017 CLINICAL DATA:  Pelvic pain EXAM: TRANSABDOMINAL AND TRANSVAGINAL ULTRASOUND OF PELVIS DOPPLER ULTRASOUND OF OVARIES TECHNIQUE: Both transabdominal and transvaginal ultrasound examinations of the pelvis were performed. Transabdominal technique was performed for global imaging of the pelvis including uterus, ovaries, adnexal regions, and pelvic cul-de-sac. It was necessary to proceed with endovaginal exam  following the transabdominal exam to visualize the endometrium and ovaries. Color and duplex Doppler ultrasound was utilized to evaluate blood flow to the ovaries. COMPARISON:  None. FINDINGS: Uterus Measurements: 8.5 x 4.3 x 5.0 cm = volume: 96 mL. No fibroids or other mass visualized. Endometrium Thickness: 9.  No focal abnormality visualized. Right ovary Measurements: 2.9 x 1.8 x 3.3 cm = volume: 9 mL. Normal appearance/no adnexal mass. Left ovary Measurements: 3.7 x 2.7 x 3.2 cm = volume: 16.7 mL. Normal appearance/no adnexal mass. Pulsed Doppler evaluation of both ovaries demonstrates normal low-resistance arterial and venous waveforms. Other findings Trace free fluid noted in the cul-de-sac. IMPRESSION: Unremarkable study. No findings to explain the patient's history of pain. Electronically Signed   By: Misty Stanley M.D.   On: 12/09/2017 20:04   US Pelvis Complete  Result Date: 12/09/2017 CLINICAL DATA:  Pelvic pain EXAM: TRANSABDOMINAL AND TRANSVAGINAL ULTRASOUND OF PELVIS DOPPLER ULTRASOUND OF OVARIES TECHNIQUE: Both transabdominal and transvaginal ultrasound examinations of the pelvis were performed. Transabdominal technique was performed for global imaging of the pelvis including uterus, ovaries, adnexal regions, and pelvic cul-de-sac. It was necessary to proceed with endovaginal exam following the transabdominal exam to visualize the endometrium and ovaries. Color and duplex Doppler ultrasound was utilized to evaluate blood flow to the ovaries. COMPARISON:  None. FINDINGS: Uterus Measurements: 8.5 x 4.3 x 5.0 cm = volume: 96 mL. No fibroids or other mass visualized. Endometrium Thickness: 9.  No focal abnormality visualized. Right ovary Measurements: 2.9 x 1.8 x 3.3 cm = volume: 9 mL. Normal appearance/no adnexal mass. Left ovary Measurements: 3.7 x 2.7 x 3.2 cm = volume: 16.7 mL. Normal appearance/no adnexal mass. Pulsed Doppler evaluation of both ovaries demonstrates normal low-resistance arterial  and venous waveforms. Other findings Trace free fluid noted in the cul-de-sac. IMPRESSION: Unremarkable study. No findings to explain the patient's history of pain. Electronically Signed   By: Misty Stanley M.D.   On: 12/09/2017 20:04   Ct Abdomen Pelvis W Contrast  Result Date: 12/10/2017 CLINICAL DATA:  Fever for couple of days. Lower abdominal pain. Vaginal pain. EXAM: CT ABDOMEN AND PELVIS WITH CONTRAST TECHNIQUE: Multidetector CT imaging of the abdomen and pelvis was performed using the standard protocol following bolus administration of intravenous contrast. CONTRAST:  129mL OMNIPAQUE IOHEXOL  300 MG/ML  SOLN COMPARISON:  None. FINDINGS: Lower chest: No acute abnormality. Hepatobiliary: Ill-defined region of mixed attenuation, primarily low-attenuation, in the right hepatic lobe. This abnormality measures at least 5.3 cm as seen on coronal image 54. Along the superior edge of this abnormality is a more discrete rounded low-attenuation mass measuring 16 mm on coronal image 49. Whether this more discrete masses part of the broader process are separate is unclear. Focal fatty deposition adjacent to the falciform ligament. No other liver abnormalities are noted. The portal vein is patent. Pericholecystic fluid is identified without an obvious stone. Pancreas: Unremarkable. No pancreatic ductal dilatation or surrounding inflammatory changes. Spleen: Normal in size without focal abnormality. Adrenals/Urinary Tract: A 15 mm low-attenuation mass in the superior right kidney demonstrates an attenuation of 60 Hounsfield units. No other renal masses. No perinephric stranding or stones. No ureterectasis or ureteral stones. The bladder is unremarkable. The adrenal glands are normal. Stomach/Bowel: The stomach and small bowel are normal. The colon and appendix are normal. Vascular/Lymphatic: No significant vascular findings are present. No enlarged abdominal or pelvic lymph nodes. Reproductive: Uterus and bilateral  adnexa are unremarkable. Other: There is a small amount of free fluid in the pelvis, likely physiologic. No free air. Musculoskeletal: No acute or significant osseous findings. IMPRESSION: 1. There is a small amount of peri cholecystic fluid. This raises the possibility of acute cholecystitis. However, no stones are seen. Recommend right upper quadrant ultrasound. 2. 5.3 cm masslike region of mixed attenuation in the right hepatic lobe. There is an adjacent 16 mm low-attenuation mass which could be part of the broader process or a separate mass. The findings are nonspecific but are suspicious for a neoplasm or neoplasms such as an adenoma. Recommended MRI for further evaluation. I doubt the hepatic findings are associated with the patient's acute symptoms and the MRI may be performed as an outpatient depending on the appearance of the hepatic mass on the recommended ultrasound. 3. 15 mm low-attenuation nonspecific lesion in the right kidney. Recommend attention to this mass on the recommended MRI. 4. Small amount of free fluid in the pelvis is likely physiologic. Electronically Signed   By: Dorise Bullion III M.D   On: 12/10/2017 01:14   [place] Art/ven Flow Abd Pelv Doppler  Result Date: 12/09/2017 CLINICAL DATA:  Pelvic pain EXAM: TRANSABDOMINAL AND TRANSVAGINAL ULTRASOUND OF PELVIS DOPPLER ULTRASOUND OF OVARIES TECHNIQUE: Both transabdominal and transvaginal ultrasound examinations of the pelvis were performed. Transabdominal technique was performed for global imaging of the pelvis including uterus, ovaries, adnexal regions, and pelvic cul-de-sac. It was necessary to proceed with endovaginal exam following the transabdominal exam to visualize the endometrium and ovaries. Color and duplex Doppler ultrasound was utilized to evaluate blood flow to the ovaries. COMPARISON:  None. FINDINGS: Uterus Measurements: 8.5 x 4.3 x 5.0 cm = volume: 96 mL. No fibroids or other mass visualized. Endometrium Thickness: 9.  No  focal abnormality visualized. Right ovary Measurements: 2.9 x 1.8 x 3.3 cm = volume: 9 mL. Normal appearance/no adnexal mass. Left ovary Measurements: 3.7 x 2.7 x 3.2 cm = volume: 16.7 mL. Normal appearance/no adnexal mass. Pulsed Doppler evaluation of both ovaries demonstrates normal low-resistance arterial and venous waveforms. Other findings Trace free fluid noted in the cul-de-sac. IMPRESSION: Unremarkable study. No findings to explain the patient's history of pain. Electronically Signed   By: Misty Stanley M.D.   On: 12/09/2017 20:04    Time Spent in minutes  25   Trenice Mesa M.D on 12/11/2017  at 11:23 AM  Between 7am to 7pm - Pager - [phone]  After 7pm go to [email or web address] - password [facility]  [facility] -  Office  [phone]

## 2017-12-11 NOTE — Consult Note (Addendum)
Reason for Consult: liver abscess Referring Physician: Dr. Cathlean Sauer Chief complaint: Upper respiratory tract symptoms, vaginal pain, fever  Anne Newton is an 27 y.o. female.   HPI: Patient is a 27 year old female who presented with fever for 2 days.  She was also complaining of headache,sore throat, mild cough, lower abdominal pain, and vaginal pain.   She was evaluated by primary care with similar symptoms about 2 weeks ago.  Her major complaint was vague hot feeling and canker sores.  She had a normal STD screen on 11/16/2017.  Symptoms improved and then returned again about 2 days ago.  She describes pain as being in the lower abdomen pointing both the left and the right side.  She also complained of vaginal pain, and discharge. They did a home pregnancy test and it was negative  She has a history of papillary thyroid cancer status post thyroidectomy and is on Synthroid replacement.  Work-up in the ED: On admission her temperature was 98.7.  She did spike a temperature up to 100.3 at 3 AM.  Labs show potassium of 3.7, glucose of 126, CO2 of 19, albumin 2.8, AST 80, ALT is 66 total bilirubin 1.54.  WBC is 19.8, hemoglobin 10.9, studies hematocrit 35.3, platelets 188,000.  INR is 1.31.  Admission chest x-ray was unremarkable.  Pelvic ultrasounds was negative.  Urinalysis was negative.,  Hepatitis screen was also negative for hepatitis A, B, and C. CT of the abdomen pelvis with contrast is a small amount of pericholecystic fluid with the possibility of acute cholecystitis.  There are no stones seen.  A 5.3 cm mass was noted right hepatic lobe.  The findings were nonspecific for neoplasm.  They recommend an MRI at that point.  Abdominal ultrasound showed gallbladder thickening and small amount of peri-cholecystic fluid.  No positive Murphy sign or evidence of stones.  This might reflect a subacute or chronic cholecystitis.  Cystic appearing structure in the right hepatic lobe which corresponded to the CT  findings with surrounding parenchyma being abnormal.  MRI was subsequently performed with contrast.  This shows a lesion within the posterior right liver with a maximum dimension of 2 cm.  In the acute setting this was thought to represent possible liver abscess benign her primary metastatic liver lesion was also consideration they recommended tissue sampling/needle aspiration.  There was also a category 2 Bosniak cystic lesion in the upper pole right kidney.  Diffuse gallbladder wall edema, again with possibility of cholecystitis.  Patient was seen and evaluated in the ED and admitted by medicine with sepsis, and was started on empiric antibiotics including cefotetan, vancomycin, and Flagyl.  Patient remains somewhat tachycardic, BP is stable, WBC is down to 13.1, H/H is stable.  Potassium is 3.2, chloride 113, CO2 19, glucose 103, BUN 5.  Creatinine 0.5.  AST is down to 36, ALT is 49.  We are asked to see.  Patient's primary language is [place] but her fianc is here with her, his English is fluent.  Past Medical History:  Diagnosis Date  . Asthma    prn inhaler  . Hypothyroidism   . Nasal septal deviation 05/2017  . Nasal turbinate hypertrophy 05/2017    Past Surgical History:  Procedure Laterality Date  . SEPTOPLASTY N/A 06/30/2014   Procedure: SEPTOPLASTY;  Surgeon: Rozetta Nunnery, MD;  Location: [facility];  Service: ENT;  Laterality: N/A;  . SEPTOPLASTY N/A 06/26/2017   Procedure: SEPTOPLASTY;  Surgeon: Rozetta Nunnery, MD;  Location: [facility];  Service: ENT;  Laterality: N/A;  . THYROIDECTOMY Left 08/28/2012   Procedure: LEFT THYROID LOBECTOMY;  Surgeon: Rozetta Nunnery, MD;  Location: [facility];  Service: ENT;  Laterality: Left;  . THYROIDECTOMY Right 10/21/2012   Procedure: COMPLETION THYROIDECTOMY (RIGHT THYROID LOBECTOMY);  Surgeon: Rozetta Nunnery, MD;  Location: [facility];  Service: ENT;  Laterality: Right;  . TURBINATE REDUCTION N/A  06/30/2014   Procedure: [place] REDUCTION;  Surgeon: Rozetta Nunnery, MD;  Location: [facility];  Service: ENT;  Laterality: N/A;  . TURBINATE REDUCTION Bilateral 06/26/2017   Procedure: BILATERAL TURBINATE REDUCTION;  Surgeon: Rozetta Nunnery, MD;  Location: [facility];  Service: ENT;  Laterality: Bilateral;    Family History  Problem Relation Age of Onset  . Diabetes Mellitus II Neg Hx     Social History:  reports that she has never smoked. She has never used smokeless tobacco. She reports that she does not drink alcohol or use drugs.  Tobacco: None Drugs: None Alcohol: Social She lives with her fianc She works, doing nails.  Allergies:  Allergies  Allergen Reactions  . Chicken Allergy Shortness Of Breath    EXACERBATES ASTHMA  . Shrimp [Shellfish Allergy] Shortness Of Breath    EXACERBATES ASTHMA   Prior to Admission medications   Medication Sig Start Date End Date Taking? Authorizing Provider  albuterol (PROVENTIL HFA;VENTOLIN HFA) 108 (90 Base) MCG/ACT inhaler Inhale 2 puffs into the lungs every 6 (six) hours as needed for wheezing. 02/12/17  Yes Tereasa Coop, PA-C  levothyroxine (SYNTHROID, LEVOTHROID) 88 MCG tablet Take 88 mcg by mouth daily before breakfast.   Yes [provider]  FLUoxetine (PROZAC) 20 MG tablet Take 1 tablet (20 mg total) by mouth daily. If no improvement after 3 weeks, start taking 2 pills/daily. Patient not taking: Reported on 11/16/2017 07/30/17   McVey, Gelene Mink, PA-C  levonorgestrel-ethinyl estradiol (AVIANE) 0.1-20 MG-MCG tablet Take 1 tablet by mouth daily. Patient not taking: Reported on 07/30/2017 07/04/17   McVey, Gelene Mink, PA-C  magic mouthwash w/lidocaine SOLN Take 10 mLs by mouth every 2 (two) hours as needed for mouth pain. Patient not taking: Reported on 12/09/2017 11/16/17   Leonie Douglas, PA-C     Results for orders placed or performed during the hospital encounter  of 12/09/17 (from the past 48 hour(s))  Comprehensive metabolic panel     Status: Abnormal   Collection Time: 12/09/17  4:49 PM  Result Value Ref Range   Sodium 141 135 - 145 mmol/L   Potassium 3.3 (L) 3.5 - 5.1 mmol/L   Chloride 103 98 - 111 mmol/L   CO2 24 22 - 32 mmol/L   Glucose, Bld 113 (H) 70 - 99 mg/dL   BUN 18 6 - 20 mg/dL   Creatinine, Ser 0.90 0.44 - 1.00 mg/dL   Calcium 9.4 8.9 - 10.3 mg/dL   Total Protein 8.4 (H) 6.5 - 8.1 g/dL   Albumin 4.3 3.5 - 5.0 g/dL   AST 77 (H) 15 - 41 U/L   ALT 50 (H) 0 - 44 U/L   Alkaline Phosphatase 104 38 - 126 U/L   Total Bilirubin 1.6 (H) 0.3 - 1.2 mg/dL   GFR calc non Af Amer >60 >60 mL/min   GFR calc Af Amer >60 >60 mL/min    Comment: (NOTE) The eGFR has been calculated using the CKD EPI equation. This calculation has not been validated in all clinical situations. eGFR's persistently <  60 mL/min signify possible Chronic Kidney Disease.    Anion gap 14 5 - 15    Comment: Performed at [facility], [place] [address] [place], [place] [address]  CBC with Differential     Status: Abnormal   Collection Time: 12/09/17  4:49 PM  Result Value Ref Range   WBC 8.6 4.0 - 10.5 K/uL   RBC 5.09 3.87 - 5.11 MIL/uL   Hemoglobin 13.8 12.0 - 15.0 g/dL   HCT 45.2 36.0 - 46.0 %   MCV 88.8 80.0 - 100.0 fL   MCH 27.1 26.0 - 34.0 pg   MCHC 30.5 30.0 - 36.0 g/dL   RDW 12.7 11.5 - 15.5 %   Platelets 280 150 - 400 K/uL   nRBC 0.0 0.0 - 0.2 %   Neutrophils Relative % 93 %   Neutro Abs 8.0 (H) 1.7 - 7.7 K/uL   Lymphocytes Relative 6 %   Lymphs Abs 0.5 (L) 0.7 - 4.0 K/uL   Monocytes Relative 0 %   Monocytes Absolute 0.0 (L) 0.1 - 1.0 K/uL   Eosinophils Relative 0 %   Eosinophils Absolute 0.0 0.0 - 0.5 K/uL   Basophils Relative 0 %   Basophils Absolute 0.0 0.0 - 0.1 K/uL   Immature Granulocytes 1 %   Abs Immature Granulocytes 0.06 0.00 - 0.07 K/uL    Comment: Performed at [facility], [place] [address],  [place], [place] [address]  Protime-INR     Status: None   Collection Time: 12/09/17  4:49 PM  Result Value Ref Range   Prothrombin Time 13.6 11.4 - 15.2 seconds   INR 1.05     Comment: Performed at [facility], [place] [address], [place], [place] [address]  Culture, blood (Routine x 2)     Status: None (Preliminary result)   Collection Time: 12/09/17  4:49 PM  Result Value Ref Range   Specimen Description      LEFT ANTECUBITAL Performed at [facility] [address], [place], [place] [address]    Special Requests      BOTTLES DRAWN AEROBIC AND ANAEROBIC Blood Culture results may not be optimal due to an excessive volume of blood received in culture bottles Performed at [facility] [address] [place], [place] [address]    Culture  Setup Time      GRAM NEGATIVE RODS ANAEROBIC BOTTLE ONLY CRITICAL RESULT CALLED TO, READ BACK BY AND VERIFIED WITH: Colin Rhein PHARMD [id number] 12/11/17 A BROWNING Performed at [facility], [place] [address], [place], [place] [address]    Culture PENDING    Report Status PENDING   I-Stat beta hCG blood, ED     Status: None   Collection Time: 12/09/17  4:55 PM  Result Value Ref Range   I-stat hCG, quantitative <5.0 <5 mIU/mL   Comment 3            Comment:   GEST. AGE      CONC.  (mIU/mL)   <=1 WEEK        5 - 50     2 WEEKS       50 - 500     3 WEEKS       100 - 10,000     4 WEEKS     1,000 - 30,000        FEMALE AND NON-PREGNANT FEMALE:     LESS THAN 5 mIU/mL   CG4 I-STAT (Lactic  acid)     Status: Abnormal   Collection Time: 12/09/17  4:56 PM  Result Value Ref Range   Lactic Acid, Venous 4.64 (HH) 0.5 - 1.9 mmol/L   Comment NOTIFIED PHYSICIAN   Group A Strep by PCR     Status: None   Collection Time: 12/09/17  4:58 PM  Result Value Ref Range   Group A Strep by PCR NOT DETECTED NOT DETECTED    Comment: Performed at [facility], [place] [address], [place], [place] [address]   Mononucleosis screen     Status: None   Collection Time: 12/09/17  4:58 PM  Result Value Ref Range   Mono Screen NEGATIVE NEGATIVE    Comment: Performed at [facility], [place] [address], [place], [place] [address]  Influenza panel by PCR (type A & B)     Status: None   Collection Time: 12/09/17  4:58 PM  Result Value Ref Range   Influenza A By PCR NEGATIVE NEGATIVE   Influenza B By PCR NEGATIVE NEGATIVE    Comment: (NOTE) The Xpert Xpress Flu assay is intended as an aid in the diagnosis of  influenza and should not be used as a sole basis for treatment.  This  assay is FDA approved for nasopharyngeal swab specimens only. Nasal  washings and aspirates are unacceptable for Xpert Xpress Flu testing. Performed at [facility], [place] [address], [place], [place] [address]   Culture, blood (Routine x 2)     Status: None (Preliminary result)   Collection Time: 12/09/17  5:07 PM  Result Value Ref Range   Specimen Description      RIGHT ANTECUBITAL Performed at [facility] [address] [place], [place] [address]    Special Requests      BOTTLES DRAWN AEROBIC AND ANAEROBIC Blood Culture adequate volume   Culture  Setup Time      ANAEROBIC BOTTLE ONLY Organism ID to follow GRAM NEGATIVE RODS CRITICAL RESULT CALLED TO, READ BACK BY AND VERIFIED WITH: Colin Rhein PHARMD [id number] 12/11/17 A BROWNING Performed at [facility], [place] [address], [place], [place] [address]    Culture PENDING    Report Status PENDING   Blood Culture ID Panel (Reflexed)     Status: None   Collection Time: 12/09/17  5:07 PM  Result Value Ref Range   Enterococcus species NOT DETECTED NOT DETECTED   Listeria monocytogenes NOT DETECTED NOT DETECTED   Staphylococcus species NOT DETECTED NOT DETECTED   Staphylococcus aureus (BCID) NOT DETECTED NOT DETECTED   Streptococcus species NOT DETECTED NOT DETECTED   Streptococcus agalactiae NOT DETECTED NOT DETECTED    Streptococcus pneumoniae NOT DETECTED NOT DETECTED   Streptococcus pyogenes NOT DETECTED NOT DETECTED   Acinetobacter baumannii NOT DETECTED NOT DETECTED   Enterobacteriaceae species NOT DETECTED NOT DETECTED   Enterobacter cloacae complex NOT DETECTED NOT DETECTED   Escherichia coli NOT DETECTED NOT DETECTED   Klebsiella oxytoca NOT DETECTED NOT DETECTED   Klebsiella pneumoniae NOT DETECTED NOT DETECTED   Proteus species NOT DETECTED NOT DETECTED   Serratia marcescens NOT DETECTED NOT DETECTED   Haemophilus influenzae NOT DETECTED NOT DETECTED   Neisseria meningitidis NOT DETECTED NOT DETECTED   Pseudomonas aeruginosa NOT DETECTED NOT DETECTED   Candida albicans NOT DETECTED NOT DETECTED   Candida glabrata NOT DETECTED NOT DETECTED   Candida krusei NOT DETECTED NOT DETECTED   Candida parapsilosis NOT DETECTED NOT DETECTED   Candida tropicalis NOT  DETECTED NOT DETECTED    Comment: Performed at [facility], [place] [address] [place], [place] [address]  RPR     Status: None   Collection Time: 12/09/17  5:11 PM  Result Value Ref Range   RPR Ser Ql Non Reactive Non Reactive    Comment: (NOTE) Performed At: [facility] [place], [place] [id number] Rush Farmer MD EH:[id number]   HIV antibody     Status: None   Collection Time: 12/09/17  5:11 PM  Result Value Ref Range   HIV Screen 4th Generation wRfx Non Reactive Non Reactive    Comment: (NOTE) Performed At: [facility] [place], [place] [id number] Rush Farmer MD YY:[id number]   Wet prep, genital     Status: Abnormal   Collection Time: 12/09/17  5:11 PM  Result Value Ref Range   Yeast Wet Prep HPF POC FEW (A) NONE SEEN   Trich, Wet Prep NONE SEEN NONE SEEN   Clue Cells Wet Prep HPF POC PRESENT (A) NONE SEEN   WBC, Wet Prep HPF POC MANY (A) NONE SEEN   Sperm NONE SEEN     Comment: Performed at [facility], [place] [address] [place], [place] [address]   CG4 I-STAT (Lactic acid)     Status: None   Collection Time: 12/09/17  8:53 PM  Result Value Ref Range   Lactic Acid, Venous 1.14 0.5 - 1.9 mmol/L  Urinalysis, Routine w reflex microscopic     Status: None   Collection Time: 12/09/17  9:00 PM  Result Value Ref Range   Color, Urine YELLOW YELLOW   APPearance CLEAR CLEAR   Specific Gravity, Urine 1.005 1.005 - 1.030   pH 6.0 5.0 - 8.0   Glucose, UA NEGATIVE NEGATIVE mg/dL   Hgb urine dipstick NEGATIVE NEGATIVE   Bilirubin Urine NEGATIVE NEGATIVE   Ketones, ur NEGATIVE NEGATIVE mg/dL   Protein, ur NEGATIVE NEGATIVE mg/dL   Nitrite NEGATIVE NEGATIVE   Leukocytes, UA NEGATIVE NEGATIVE    Comment: Performed at [facility] [address], [place], [place] [address]  MRSA PCR Screening     Status: None   Collection Time: 12/10/17 12:12 AM  Result Value Ref Range   MRSA by PCR NEGATIVE NEGATIVE    Comment:        The GeneXpert MRSA Assay (FDA approved for NASAL specimens only), is one component of a comprehensive MRSA colonization surveillance program. It is not intended to diagnose MRSA infection nor to guide or monitor treatment for MRSA infections. Performed at [facility], [place] [address] [place], [place] [address]   Lactic acid, plasma     Status: None   Collection Time: 12/10/17  1:12 AM  Result Value Ref Range   Lactic Acid, Venous 1.4 0.5 - 1.9 mmol/L    Comment: Performed at [facility], [place] [address] [place], [place] [address]  Procalcitonin     Status: None   Collection Time: 12/10/17  1:12 AM  Result Value Ref Range   Procalcitonin 88.84 ng/mL    Comment:        Interpretation: PCT >= 10 ng/mL: Important systemic inflammatory response, almost exclusively due to severe bacterial sepsis or septic shock. (NOTE)       Sepsis PCT Algorithm           Lower Respiratory Tract  Infection PCT Algorithm     ----------------------------     ----------------------------         PCT < 0.25 ng/mL                PCT < 0.10 ng/mL         Strongly encourage             Strongly discourage   discontinuation of antibiotics    initiation of antibiotics    ----------------------------     -----------------------------       PCT 0.25 - 0.50 ng/mL            PCT 0.10 - 0.25 ng/mL               OR       >80% decrease in PCT            Discourage initiation of                                            antibiotics      Encourage discontinuation           of antibiotics    ----------------------------     -----------------------------         PCT >= 0.50 ng/mL              PCT 0.26 - 0.50 ng/mL                AND       <80% decrease in PCT             Encourage initiation of                                             antibiotics       Encourage continuation           of antibiotics    ----------------------------     -----------------------------        PCT >= 0.50 ng/mL                  PCT > 0.50 ng/mL               AND         increase in PCT                  Strongly encourage                                      initiation of antibiotics    Strongly encourage escalation           of antibiotics                                     -----------------------------                                           PCT <= 0.25 ng/mL  OR                                        > 80% decrease in PCT                                     Discontinue / Do not initiate                                             antibiotics Performed at [facility] [address], [place], [place] [address]   CBC     Status: Abnormal   Collection Time: 12/10/17  1:12 AM  Result Value Ref Range   WBC 22.0 (H) 4.0 - 10.5 K/uL   RBC 3.84 (L) 3.87 - 5.11 MIL/uL   Hemoglobin 10.8 (L) 12.0 - 15.0 g/dL    Comment: REPEATED TO VERIFY DELTA CHECK NOTED    HCT 36.8  36.0 - 46.0 %   MCV 95.8 80.0 - 100.0 fL    Comment: REPEATED TO VERIFY DELTA CHECK NOTED    MCH 28.1 26.0 - 34.0 pg   MCHC 29.3 (L) 30.0 - 36.0 g/dL   RDW 13.2 11.5 - 15.5 %   Platelets 213 150 - 400 K/uL   nRBC 0.0 0.0 - 0.2 %    Comment: Performed at [facility], [place] [address], [place], [place] [address]  Creatinine, serum     Status: None   Collection Time: 12/10/17  1:12 AM  Result Value Ref Range   Creatinine, Ser 0.75 0.44 - 1.00 mg/dL   GFR calc non Af Amer >60 >60 mL/min   GFR calc Af Amer >60 >60 mL/min    Comment: (NOTE) The eGFR has been calculated using the CKD EPI equation. This calculation has not been validated in all clinical situations. eGFR's persistently <60 mL/min signify possible Chronic Kidney Disease. Performed at [facility], [place] [address] [place], [place] [address]   Hepatitis panel, acute     Status: None   Collection Time: 12/10/17  1:12 AM  Result Value Ref Range   Hepatitis B Surface Ag Negative Negative   HCV Ab <0.1 0.0 - 0.9 s/co ratio    Comment: (NOTE)                                  Negative:     < 0.8                             Indeterminate: 0.8 - 0.9                                  Positive:     > 0.9 The CDC recommends that a positive HCV antibody result be followed up with a HCV Nucleic Acid Amplification test (771165). Performed At: [facility] [place], [place] [id number] Rush Farmer MD [id number]    Hep A IgM Negative Negative   Hep B C IgM Negative Negative  CBC with  Differential     Status: Abnormal   Collection Time: 12/10/17  3:05 AM  Result Value Ref Range   WBC 19.8 (H) 4.0 - 10.5 K/uL   RBC 3.82 (L) 3.87 - 5.11 MIL/uL   Hemoglobin 10.9 (L) 12.0 - 15.0 g/dL   HCT 35.3 (L) 36.0 - 46.0 %   MCV 92.4 80.0 - 100.0 fL   MCH 28.5 26.0 - 34.0 pg   MCHC 30.9 30.0 - 36.0 g/dL   RDW 13.2 11.5 - 15.5 %   Platelets 188 150 - 400 K/uL   nRBC 0.0 0.0 -  0.2 %   Neutrophils Relative % 93 %   Neutro Abs 18.4 (H) 1.7 - 7.7 K/uL   Lymphocytes Relative 3 %   Lymphs Abs 0.6 (L) 0.7 - 4.0 K/uL   Monocytes Relative 3 %   Monocytes Absolute 0.5 0.1 - 1.0 K/uL   Eosinophils Relative 0 %   Eosinophils Absolute 0.0 0.0 - 0.5 K/uL   Basophils Relative 0 %   Basophils Absolute 0.1 0.0 - 0.1 K/uL   Immature Granulocytes 1 %   Abs Immature Granulocytes 0.24 (H) 0.00 - 0.07 K/uL    Comment: Performed at [facility], [place] [address] [place], [place] [address]  Lactic acid, plasma     Status: None   Collection Time: 12/10/17  3:05 AM  Result Value Ref Range   Lactic Acid, Venous 1.5 0.5 - 1.9 mmol/L    Comment: Performed at [facility], [place] [address], [place], [place] [address]  Hepatic function panel     Status: Abnormal   Collection Time: 12/10/17  3:05 AM  Result Value Ref Range   Total Protein 5.8 (L) 6.5 - 8.1 g/dL   Albumin 2.8 (L) 3.5 - 5.0 g/dL   AST 80 (H) 15 - 41 U/L   ALT 66 (H) 0 - 44 U/L   Alkaline Phosphatase 74 38 - 126 U/L   Total Bilirubin 1.5 (H) 0.3 - 1.2 mg/dL   Bilirubin, Direct 0.3 (H) 0.0 - 0.2 mg/dL   Indirect Bilirubin 1.2 (H) 0.3 - 0.9 mg/dL    Comment: Performed at [facility], [place] [address], [place], [place] [address]  Basic metabolic panel     Status: Abnormal   Collection Time: 12/10/17  3:05 AM  Result Value Ref Range   Sodium 141 135 - 145 mmol/L   Potassium 3.7 3.5 - 5.1 mmol/L   Chloride 116 (H) 98 - 111 mmol/L   CO2 19 (L) 22 - 32 mmol/L   Glucose, Bld 126 (H) 70 - 99 mg/dL   BUN 11 6 - 20 mg/dL   Creatinine, Ser 0.65 0.44 - 1.00 mg/dL   Calcium 7.3 (L) 8.9 - 10.3 mg/dL    Comment: DELTA CHECK NOTED   GFR calc non Af Amer >60 >60 mL/min   GFR calc Af Amer >60 >60 mL/min    Comment: (NOTE) The eGFR has been calculated using the CKD EPI equation. This calculation has not been validated in all clinical situations. eGFR's persistently <60 mL/min  signify possible Chronic Kidney Disease.    Anion gap 6 5 - 15    Comment: Performed at [facility], [place] [address] [place], [place] [address]  Protime-INR     Status: Abnormal   Collection Time: 12/10/17  7:30 AM  Result Value Ref Range   Prothrombin Time 16.1 (H) 11.4 - 15.2 seconds   INR 1.31  Comment: Performed at [facility], [place] [address], [place], [place] [address]  CBC     Status: Abnormal   Collection Time: 12/11/17  2:35 AM  Result Value Ref Range   WBC 13.1 (H) 4.0 - 10.5 K/uL   RBC 3.88 3.87 - 5.11 MIL/uL   Hemoglobin 10.7 (L) 12.0 - 15.0 g/dL   HCT 34.5 (L) 36.0 - 46.0 %   MCV 88.9 80.0 - 100.0 fL   MCH 27.6 26.0 - 34.0 pg   MCHC 31.0 30.0 - 36.0 g/dL   RDW 13.4 11.5 - 15.5 %   Platelets 162 150 - 400 K/uL   nRBC 0.0 0.0 - 0.2 %    Comment: Performed at [facility], [place] [address] [place], [place] [address]  Comprehensive metabolic panel     Status: Abnormal   Collection Time: 12/11/17  2:35 AM  Result Value Ref Range   Sodium 140 135 - 145 mmol/L   Potassium 3.2 (L) 3.5 - 5.1 mmol/L   Chloride 113 (H) 98 - 111 mmol/L   CO2 19 (L) 22 - 32 mmol/L   Glucose, Bld 103 (H) 70 - 99 mg/dL   BUN 5 (L) 6 - 20 mg/dL   Creatinine, Ser 0.50 0.44 - 1.00 mg/dL   Calcium 7.8 (L) 8.9 - 10.3 mg/dL   Total Protein 6.0 (L) 6.5 - 8.1 g/dL   Albumin 3.1 (L) 3.5 - 5.0 g/dL   AST 36 15 - 41 U/L   ALT 49 (H) 0 - 44 U/L   Alkaline Phosphatase 66 38 - 126 U/L   Total Bilirubin 1.3 (H) 0.3 - 1.2 mg/dL   GFR calc non Af Amer >60 >60 mL/min   GFR calc Af Amer >60 >60 mL/min    Comment: (NOTE) The eGFR has been calculated using the CKD EPI equation. This calculation has not been validated in all clinical situations. eGFR's persistently <60 mL/min signify possible Chronic Kidney Disease.    Anion gap 8 5 - 15    Comment: Performed at [facility], [place] [address], [place],  [address]    Dg  Chest 2 View  Result Date: 12/09/2017 CLINICAL DATA:  Possible sepsis EXAM: CHEST - 2 VIEW COMPARISON:  10/16/2012 FINDINGS: The heart size and mediastinal contours are within normal limits. Both lungs are clear. The visualized skeletal structures are unremarkable. IMPRESSION: No active cardiopulmonary disease. Electronically Signed   By: Donavan Foil M.D.   On: 12/09/2017 18:19   Mr Abdomen W Wo Contrast  Result Date: 12/10/2017 CLINICAL DATA:  Fevers and chills.  Evaluate for liver abscess. EXAM: MRI ABDOMEN WITHOUT AND WITH CONTRAST TECHNIQUE: Multiplanar multisequence MR imaging of the abdomen was performed both before and after the administration of intravenous contrast. CONTRAST:  7 cc of Gadavist. COMPARISON:  12/10/2017 FINDINGS: Lower chest: Small pleural effusions. Hepatobiliary: Within the posterior right lobe of liver there is a mildly T2 hyperintense structure measuring 2 cm, image 49/802. There is mild peripheral hyper enhancement associated with this structure without enhancing internal soft tissue component. No additional focal liver abnormality identified. There is mild diffuse gallbladder wall edema, image 33/5. No gallstones identified. No biliary ductal dilatation. Pancreas: No mass, inflammatory changes, or other parenchymal abnormality identified. Spleen:  Within normal limits in size and appearance. Adrenals/Urinary Tract: Normal appearance of the adrenal glands. Arising from the upper pole of the right kidney is a mild T1 hyperintense and T2 hypointense structure without internal enhancement, image 63/802.  No hydronephrosis identified bilaterally. Stomach/Bowel: Stomach and the visualized upper abdominal bowel loops are unremarkable. Vascular/Lymphatic: No pathologically enlarged lymph nodes identified. No abdominal aortic aneurysm demonstrated. Other:  None. Musculoskeletal: No suspicious bone lesions identified. IMPRESSION: 1. Lesion within the posterior right lobe of liver is  again noted with a maximum dimension of 2 cm. There is hyperemia of the surrounding soft tissues. In the acute setting findings may represent a small liver abscess. Benign or malignant primary or metastatic liver lesion is considered less favored but not excluded. Further workup with tissue sampling/needle aspiration versus follow-up imaging after appropriate antibiotic therapy is recommended to confirm benignity. 2. Lesion arising from the upper pole of the right kidney is favored to represent a hemorrhagic/proteinaceous cyst. This is compatible with a benign, Bosniak category 2 lesion. 3. Diffuse gallbladder wall edema.  Cannot rule out cholecystitis. Electronically Signed   By: Kerby Moors M.D.   On: 12/10/2017 18:15   US Abdomen Complete  Result Date: 12/10/2017 CLINICAL DATA:  Elevated liver function studies, elevated white blood cell count. EXAM: ABDOMEN ULTRASOUND COMPLETE COMPARISON:  Abdominopelvic CT scan of December 10, 2017 FINDINGS: Gallbladder: The gallbladder is adequately distended. There is irregular gallbladder wall thickening with maximal measurement of 3.8 mm. There is pericholecystic fluid. There is no positive sonographic Murphy's sign. No stones are observed. Common bile duct: Diameter: 2.4 mm Liver: In the right hepatic lobe posteriorly there is a cystic region corresponding to the focal hypo echo it hypodense region on the CT scan. It measures approximately 1.3 x 1.4 x 1.5.cm. There there is mixed echotexture surrounding the discrete cyst. Portal vein is patent on color Doppler imaging with normal direction of blood flow towards the liver. IVC: No abnormality visualized. Pancreas: Visualized portion unremarkable. Spleen: Size and appearance within normal limits. Right Kidney: Length: 11.6 cm. Echogenicity within normal limits. No mass or hydronephrosis visualized. Left Kidney: Length: 11.7 cm. Echogenicity within normal limits. No mass or hydronephrosis visualized. Abdominal aorta:  No aneurysm visualized. Other findings: None. IMPRESSION: Gallbladder wall thickening and small amount of pericholecystic fluid. No positive sonographic Murphy's sign or evidence of stones. This may reflect subacute or chronic cholecystitis. Cystic appearing structure in the right hepatic lobe which corresponds to the CT finding. However, surrounding abnormal appearing parenchyma on the CT scan is evident on the ultrasound where there is a mixed echogenicity pattern. Neoplasm or hepatic abscess could be present and present in this manner. Again, MRI of the liver is recommended. No discrete ultrasonic abnormality in the upper pole of the right kidney. An area of subtle decreased density in the upper pole collecting system suggests a prominent calyx but is nonspecific. Again this too could be addressed on the above-mentioned MRI. Electronically Signed   By: David  Martinique M.D.   On: 12/10/2017 07:17   US Transvaginal Non-ob  Result Date: 12/09/2017 CLINICAL DATA:  Pelvic pain EXAM: TRANSABDOMINAL AND TRANSVAGINAL ULTRASOUND OF PELVIS DOPPLER ULTRASOUND OF OVARIES TECHNIQUE: Both transabdominal and transvaginal ultrasound examinations of the pelvis were performed. Transabdominal technique was performed for global imaging of the pelvis including uterus, ovaries, adnexal regions, and pelvic cul-de-sac. It was necessary to proceed with endovaginal exam following the transabdominal exam to visualize the endometrium and ovaries. Color and duplex Doppler ultrasound was utilized to evaluate blood flow to the ovaries. COMPARISON:  None. FINDINGS: Uterus Measurements: 8.5 x 4.3 x 5.0 cm = volume: 96 mL. No fibroids or other mass visualized. Endometrium Thickness: 9.  No focal abnormality visualized. Right ovary  Measurements: 2.9 x 1.8 x 3.3 cm = volume: 9 mL. Normal appearance/no adnexal mass. Left ovary Measurements: 3.7 x 2.7 x 3.2 cm = volume: 16.7 mL. Normal appearance/no adnexal mass. Pulsed Doppler evaluation of both  ovaries demonstrates normal low-resistance arterial and venous waveforms. Other findings Trace free fluid noted in the cul-de-sac. IMPRESSION: Unremarkable study. No findings to explain the patient's history of pain. Electronically Signed   By: Misty Stanley M.D.   On: 12/09/2017 20:04   US Pelvis Complete  Result Date: 12/09/2017 CLINICAL DATA:  Pelvic pain EXAM: TRANSABDOMINAL AND TRANSVAGINAL ULTRASOUND OF PELVIS DOPPLER ULTRASOUND OF OVARIES TECHNIQUE: Both transabdominal and transvaginal ultrasound examinations of the pelvis were performed. Transabdominal technique was performed for global imaging of the pelvis including uterus, ovaries, adnexal regions, and pelvic cul-de-sac. It was necessary to proceed with endovaginal exam following the transabdominal exam to visualize the endometrium and ovaries. Color and duplex Doppler ultrasound was utilized to evaluate blood flow to the ovaries. COMPARISON:  None. FINDINGS: Uterus Measurements: 8.5 x 4.3 x 5.0 cm = volume: 96 mL. No fibroids or other mass visualized. Endometrium Thickness: 9.  No focal abnormality visualized. Right ovary Measurements: 2.9 x 1.8 x 3.3 cm = volume: 9 mL. Normal appearance/no adnexal mass. Left ovary Measurements: 3.7 x 2.7 x 3.2 cm = volume: 16.7 mL. Normal appearance/no adnexal mass. Pulsed Doppler evaluation of both ovaries demonstrates normal low-resistance arterial and venous waveforms. Other findings Trace free fluid noted in the cul-de-sac. IMPRESSION: Unremarkable study. No findings to explain the patient's history of pain. Electronically Signed   By: Misty Stanley M.D.   On: 12/09/2017 20:04   Ct Abdomen Pelvis W Contrast  Result Date: 12/10/2017 CLINICAL DATA:  Fever for couple of days. Lower abdominal pain. Vaginal pain. EXAM: CT ABDOMEN AND PELVIS WITH CONTRAST TECHNIQUE: Multidetector CT imaging of the abdomen and pelvis was performed using the standard protocol following bolus administration of intravenous contrast.  CONTRAST:  14m OMNIPAQUE IOHEXOL 300 MG/ML  SOLN COMPARISON:  None. FINDINGS: Lower chest: No acute abnormality. Hepatobiliary: Ill-defined region of mixed attenuation, primarily low-attenuation, in the right hepatic lobe. This abnormality measures at least 5.3 cm as seen on coronal image 54. Along the superior edge of this abnormality is a more discrete rounded low-attenuation mass measuring 16 mm on coronal image 49. Whether this more discrete masses part of the broader process are separate is unclear. Focal fatty deposition adjacent to the falciform ligament. No other liver abnormalities are noted. The portal vein is patent. Pericholecystic fluid is identified without an obvious stone. Pancreas: Unremarkable. No pancreatic ductal dilatation or surrounding inflammatory changes. Spleen: Normal in size without focal abnormality. Adrenals/Urinary Tract: A 15 mm low-attenuation mass in the superior right kidney demonstrates an attenuation of 60 Hounsfield units. No other renal masses. No perinephric stranding or stones. No ureterectasis or ureteral stones. The bladder is unremarkable. The adrenal glands are normal. Stomach/Bowel: The stomach and small bowel are normal. The colon and appendix are normal. Vascular/Lymphatic: No significant vascular findings are present. No enlarged abdominal or pelvic lymph nodes. Reproductive: Uterus and bilateral adnexa are unremarkable. Other: There is a small amount of free fluid in the pelvis, likely physiologic. No free air. Musculoskeletal: No acute or significant osseous findings. IMPRESSION: 1. There is a small amount of peri cholecystic fluid. This raises the possibility of acute cholecystitis. However, no stones are seen. Recommend right upper quadrant ultrasound. 2. 5.3 cm masslike region of mixed attenuation in the right hepatic lobe. There is  an adjacent 16 mm low-attenuation mass which could be part of the broader process or a separate mass. The findings are nonspecific  but are suspicious for a neoplasm or neoplasms such as an adenoma. Recommended MRI for further evaluation. I doubt the hepatic findings are associated with the patient's acute symptoms and the MRI may be performed as an outpatient depending on the appearance of the hepatic mass on the recommended ultrasound. 3. 15 mm low-attenuation nonspecific lesion in the right kidney. Recommend attention to this mass on the recommended MRI. 4. Small amount of free fluid in the pelvis is likely physiologic. Electronically Signed   By: Dorise Bullion III M.D   On: 12/10/2017 01:14   [place] Art/ven Flow Abd Pelv Doppler  Result Date: 12/09/2017 CLINICAL DATA:  Pelvic pain EXAM: TRANSABDOMINAL AND TRANSVAGINAL ULTRASOUND OF PELVIS DOPPLER ULTRASOUND OF OVARIES TECHNIQUE: Both transabdominal and transvaginal ultrasound examinations of the pelvis were performed. Transabdominal technique was performed for global imaging of the pelvis including uterus, ovaries, adnexal regions, and pelvic cul-de-sac. It was necessary to proceed with endovaginal exam following the transabdominal exam to visualize the endometrium and ovaries. Color and duplex Doppler ultrasound was utilized to evaluate blood flow to the ovaries. COMPARISON:  None. FINDINGS: Uterus Measurements: 8.5 x 4.3 x 5.0 cm = volume: 96 mL. No fibroids or other mass visualized. Endometrium Thickness: 9.  No focal abnormality visualized. Right ovary Measurements: 2.9 x 1.8 x 3.3 cm = volume: 9 mL. Normal appearance/no adnexal mass. Left ovary Measurements: 3.7 x 2.7 x 3.2 cm = volume: 16.7 mL. Normal appearance/no adnexal mass. Pulsed Doppler evaluation of both ovaries demonstrates normal low-resistance arterial and venous waveforms. Other findings Trace free fluid noted in the cul-de-sac. IMPRESSION: Unremarkable study. No findings to explain the patient's history of pain. Electronically Signed   By: Misty Stanley M.D.   On: 12/09/2017 20:04    Review of Systems   Constitutional: Positive for fever (she and her fianc says she has periods where she feels very hot.  But no recorded temperatures at home.). Negative for chills, diaphoresis, malaise/fatigue and weight loss.  HENT: Positive for hearing loss (Right ear).        She has a history of nasal surgery also. Headache x 1  Eyes: Negative.   Respiratory: Negative.   Cardiovascular: Negative.   Gastrointestinal: Positive for abdominal pain (Her abdominal pain is below the umbilicus and she points to both the left and the right side.  She denies any tenderness in the upper quadrants on either side.), constipation (She has issues with chronic constipation), nausea and vomiting (She complained of some nausea and vomiting late last evening but that is the only time she has had this problem.). Negative for blood in stool, diarrhea and melena.  Genitourinary: Negative.   Musculoskeletal: Negative.   Skin: Negative.   Neurological: Negative.   Endo/Heme/Allergies: Negative.   Psychiatric/Behavioral: Negative.    Blood pressure 107/62, pulse (!) 111, temperature 100.3 F (37.9 C), temperature source Oral, resp. rate (!) 24, height '5\' 1"'  (1.549 m), weight 54.8 kg, last menstrual period 11/16/2017, SpO2 99 %. Physical Exam  Constitutional: She is oriented to person, place, and time. She appears well-developed and well-nourished. No distress.  HENT:  Head: Normocephalic and atraumatic.  Mouth/Throat: Oropharynx is clear and moist. No oropharyngeal exudate.  Eyes: Right eye exhibits no discharge. Left eye exhibits no discharge. No scleral icterus.  Pupils are equal  Neck: Normal range of motion. Neck supple. No JVD present. No  tracheal deviation present. No thyromegaly present.  Cardiovascular: Normal rate, regular rhythm, normal heart sounds and intact distal pulses.  No murmur heard. Respiratory: Effort normal and breath sounds normal. No respiratory distress. She has no wheezes. She has no rales. She  exhibits no tenderness.  GI: Soft. Bowel sounds are normal. She exhibits no distension and no mass. There is tenderness (Minimal tenderness both lower quadrants.). There is no rebound and no guarding.  Musculoskeletal: Normal range of motion. She exhibits no edema or deformity.  Lymphadenopathy:    She has no cervical adenopathy.  Neurological: She is alert and oriented to person, place, and time. No cranial nerve deficit.  Skin: Skin is warm and dry. No rash noted. She is not diaphoretic. No erythema. No pallor.  Psychiatric: She has a normal mood and affect. Her behavior is normal. Judgment and thought content normal.    Assessment/Plan: Abdominal pain, leukocytosis, mild LFT elevation. Right posterior liver lobe lesion/possible abscess  Possible cholecystitis History of right thyroid cancer with thyroidectomy -on Synthroid Asthma -PRN inhaler  Plan: CT and MRI has been reviewed by Dr. Hassell Done.  Was his opinion we should ask IR to aspirate or biopsy the area.  Agree with IV antibiotics.  We will follow with you.     Kaydyn Chism 12/11/2017, 7:29 AM

## 2017-12-11 NOTE — Progress Notes (Signed)
PHARMACY - PHYSICIAN COMMUNICATION CRITICAL VALUE ALERT - BLOOD CULTURE IDENTIFICATION (BCID)  Anne Newton is an 27 y.o. female who presented to [facility] on 12/09/2017 with a chief complaint of fever  Assessment:Etiology unclear.  Concern for possible pelvic inflammatory disease versus?  Hepatic mass/abscess  Name of physician (or Provider) Contacted: Raliegh Ip Schorr via secure chat  Current antibiotics: vancomycin, Cefotan, Flagyl and doxycycline   Changes to prescribed antibiotics recommended:  Patient is on recommended antibiotics - No changes needed- f/u ID of GNR  Results for orders placed or performed during the hospital encounter of 12/09/17  Blood Culture ID Panel (Reflexed) (Collected: 12/09/2017  5:07 PM)  Result Value Ref Range   Enterococcus species NOT DETECTED NOT DETECTED   Listeria monocytogenes NOT DETECTED NOT DETECTED   Staphylococcus species NOT DETECTED NOT DETECTED   Staphylococcus aureus (BCID) NOT DETECTED NOT DETECTED   Streptococcus species NOT DETECTED NOT DETECTED   Streptococcus agalactiae NOT DETECTED NOT DETECTED   Streptococcus pneumoniae NOT DETECTED NOT DETECTED   Streptococcus pyogenes NOT DETECTED NOT DETECTED   Acinetobacter baumannii NOT DETECTED NOT DETECTED   Enterobacteriaceae species NOT DETECTED NOT DETECTED   Enterobacter cloacae complex NOT DETECTED NOT DETECTED   Escherichia coli NOT DETECTED NOT DETECTED   Klebsiella oxytoca NOT DETECTED NOT DETECTED   Klebsiella pneumoniae NOT DETECTED NOT DETECTED   Proteus species NOT DETECTED NOT DETECTED   Serratia marcescens NOT DETECTED NOT DETECTED   Haemophilus influenzae NOT DETECTED NOT DETECTED   Neisseria meningitidis NOT DETECTED NOT DETECTED   Pseudomonas aeruginosa NOT DETECTED NOT DETECTED   Candida albicans NOT DETECTED NOT DETECTED   Candida glabrata NOT DETECTED NOT DETECTED   Candida krusei NOT DETECTED NOT DETECTED   Candida parapsilosis NOT DETECTED NOT DETECTED   Candida  tropicalis NOT DETECTED NOT DETECTED   Eudelia Bunch, Pharm.D [phone] 12/11/2017 1:55 AM

## 2017-12-11 NOTE — Progress Notes (Signed)
MEDICATION-RELATED CONSULT NOTE   IR Procedure Consult - Anticoagulant/Antiplatelet PTA/Inpatient Med List Review by Pharmacist    Procedure: US aspiration of liver lesion    Completed: 11/19 at 1600  Post-Procedural bleeding risk per IR MD assessment: standard  Antithrombotic medications on inpatient or PTA profile prior to procedure:  Lovenox 40 mg q24 hr    Recommended restart time per IR Post-Procedure Guidelines:  AM of PPD1   Other considerations:  none   Plan:      Resume Lovenox 40 mg SQ q24 hr starting tomorrow AM  Reuel Boom, PharmD, BCPS [phone] 12/11/2017, 5:20 PM

## 2017-12-12 DIAGNOSIS — A498 Other bacterial infections of unspecified site: Secondary | ICD-10-CM

## 2017-12-12 DIAGNOSIS — E890 Postprocedural hypothyroidism: Secondary | ICD-10-CM

## 2017-12-12 LAB — BASIC METABOLIC PANEL
Anion gap: 8 (ref 5–15)
BUN: 5 mg/dL — AB (ref 6–20)
CALCIUM: 8.6 mg/dL — AB (ref 8.9–10.3)
CHLORIDE: 106 mmol/L (ref 98–111)
CO2: 23 mmol/L (ref 22–32)
Creatinine, Ser: 0.66 mg/dL (ref 0.44–1.00)
Glucose, Bld: 85 mg/dL (ref 70–99)
Potassium: 3.6 mmol/L (ref 3.5–5.1)
SODIUM: 137 mmol/L (ref 135–145)

## 2017-12-12 LAB — CULTURE, BLOOD (ROUTINE X 2): Special Requests: ADEQUATE

## 2017-12-12 LAB — HEPATIC FUNCTION PANEL
ALBUMIN: 3.1 g/dL — AB (ref 3.5–5.0)
ALT: 37 U/L (ref 0–44)
AST: 21 U/L (ref 15–41)
Alkaline Phosphatase: 62 U/L (ref 38–126)
Bilirubin, Direct: 0.2 mg/dL (ref 0.0–0.2)
Indirect Bilirubin: 0.7 mg/dL (ref 0.3–0.9)
TOTAL PROTEIN: 6.6 g/dL (ref 6.5–8.1)
Total Bilirubin: 0.9 mg/dL (ref 0.3–1.2)

## 2017-12-12 LAB — CBC
HCT: 36.2 % (ref 36.0–46.0)
Hemoglobin: 11.5 g/dL — ABNORMAL LOW (ref 12.0–15.0)
MCH: 28.3 pg (ref 26.0–34.0)
MCHC: 31.8 g/dL (ref 30.0–36.0)
MCV: 89.2 fL (ref 80.0–100.0)
PLATELETS: 186 10*3/uL (ref 150–400)
RBC: 4.06 MIL/uL (ref 3.87–5.11)
RDW: 12.9 % (ref 11.5–15.5)
WBC: 8.6 10*3/uL (ref 4.0–10.5)
nRBC: 0 % (ref 0.0–0.2)

## 2017-12-12 LAB — PROCALCITONIN: Procalcitonin: 19.93 ng/mL

## 2017-12-12 MED ORDER — SODIUM CHLORIDE 0.9 % IV BOLUS
1000.0000 mL | Freq: Once | INTRAVENOUS | Status: AC
  Administered 2017-12-12: 1000 mL via INTRAVENOUS

## 2017-12-12 MED ORDER — SODIUM CHLORIDE 0.9 % IV BOLUS
500.0000 mL | Freq: Once | INTRAVENOUS | Status: AC
  Administered 2017-12-12: 500 mL via INTRAVENOUS

## 2017-12-12 NOTE — Progress Notes (Signed)
[facility]/Trauma Progress Note      Assessment/Plan Principal Problem:   Sepsis ([place]) Active Problems:   Hypothyroidism   Hypokalemia  History of right thyroid cancer with thyroidectomy -on Synthroid Asthma -PRN inhaler  Abdominal pain, leukocytosis, mild LFT elevation. Right posterior liver lobe lesion/possible abscess  Possible cholecystitis - do not suspect this is acute cholecystitis and the findings on imaging of gallbladder inflammation are likely secondary to her liver abscess - no surgical intervention at this time but we will follow. - would recommend an infectious disease consult to determine how long she will need treatment for her bacteremia. They may also be able to weigh in on the possible etiology of liver abscess as this is unusual in a healthy 27 yo.  FEN: reg diet VTE: SCD's, lovenox ID: Flagyl 11/17..   Rocephin 11/19>> Foley: none Follow up: TBD  DISPO: recommend ID consult    LOS: 3 days    Subjective: CC: liver abscess, bacteremia  Pt denies pain and feels better today with more energy. Dr. Hassell Done and I discussed with her that we do not feel it is indicated to remove her gallbladder at this time. We also discussed that she would benefit from an infectious disease consult as we are unsure at this time the etiology of her liver abscess. Pt denies recent travel, eating raw meat, recent diarrhea. She states she has been drinking more ETOH in the last month.   Objective: Vital signs in last 24 hours: Temp:  [97.5 F (36.4 C)-98.4 F (36.9 C)] 97.5 F (36.4 C) (11/20 0503) Pulse Rate:  [74-88] 76 (11/20 0505) Resp:  [10-22] 20 (11/20 0505) BP: (87-107)/(57-77) 87/57 (11/20 0505) SpO2:  [96 %-100 %] 100 % (11/20 0505) Last BM Date: 12/10/17  Intake/Output from previous day: 11/19 0701 - 11/20 0700 In: 299.2 [IV Piggyback:299.2] Out: -  Intake/Output this shift: Total I/O In: 240 [P.O.:240] Out: -   PE: Gen:  Alert, NAD,  pleasant, cooperative Pulm:  Rate and effort normal Abd: Soft, ND, +BS, mild TTP in RUQ, no peritonitis  Skin: no rashes noted, warm and dry   Anti-infectives: Anti-infectives (From admission, onward)   Start     Dose/Rate Route Frequency Ordered Stop   12/11/17 1200  cefTRIAXone (ROCEPHIN) 2 g in sodium chloride 0.9 % 100 mL IVPB     2 g 200 mL/hr over 30 Minutes Intravenous Every 24 hours 12/11/17 0820     12/10/17 0800  vancomycin (VANCOCIN) IVPB 750 mg/150 ml premix  Status:  Discontinued     750 mg 150 mL/hr over 60 Minutes Intravenous Every 12 hours 12/10/17 0631 12/11/17 0820   12/10/17 0600  cefoTEtan (CEFOTAN) 2 g in sodium chloride 0.9 % 100 mL IVPB  Status:  Discontinued     2 g 200 mL/hr over 30 Minutes Intravenous Every 12 hours 12/10/17 0032 12/11/17 0820   12/10/17 0045  doxycycline (VIBRAMYCIN) 100 mg in sodium chloride 0.9 % 250 mL IVPB  Status:  Discontinued     100 mg 125 mL/hr over 120 Minutes Intravenous Every 12 hours 12/10/17 0032 12/11/17 0729   12/09/17 1715  ceFEPIme (MAXIPIME) 2 g in sodium chloride 0.9 % 100 mL IVPB     2 g 200 mL/hr over 30 Minutes Intravenous  Once 12/09/17 1700 12/09/17 1759   12/09/17 1715  metroNIDAZOLE (FLAGYL) IVPB 500 mg     500 mg 100 mL/hr over 60 Minutes Intravenous Every 8 hours 12/09/17 1700     12/09/17  1715  vancomycin (VANCOCIN) IVPB 1000 mg/200 mL premix     1,000 mg 200 mL/hr over 60 Minutes Intravenous  Once 12/09/17 1700 12/09/17 1917      Lab Results:  Recent Labs    12/11/17 0235 12/12/17 0623  WBC 13.1* 8.6  HGB 10.7* 11.5*  HCT 34.5* 36.2  PLT 162 186   BMET Recent Labs    12/11/17 0235 12/12/17 0623  NA 140 137  K 3.2* 3.6  CL 113* 106  CO2 19* 23  GLUCOSE 103* 85  BUN 5* 5*  CREATININE 0.50 0.66  CALCIUM 7.8* 8.6*   PT/INR Recent Labs    12/09/17 1649 12/10/17 0730  LABPROT 13.6 16.1*  INR 1.05 1.31   CMP     Component Value Date/Time   NA 137 12/12/2017 0623   NA 143 01/29/2017  1233   K 3.6 12/12/2017 0623   CL 106 12/12/2017 0623   CO2 23 12/12/2017 0623   GLUCOSE 85 12/12/2017 0623   BUN 5 (L) 12/12/2017 0623   BUN 10 01/29/2017 1233   CREATININE 0.66 12/12/2017 0623   CREATININE 0.49 (L) 11/15/2015 1758   CALCIUM 8.6 (L) 12/12/2017 0623   PROT 6.6 12/12/2017 0823   PROT 8.2 01/29/2017 1233   ALBUMIN 3.1 (L) 12/12/2017 0823   ALBUMIN 5.1 01/29/2017 1233   AST 21 12/12/2017 0823   ALT 37 12/12/2017 0823   ALKPHOS 62 12/12/2017 0823   BILITOT 0.9 12/12/2017 0823   BILITOT 1.3 (H) 01/29/2017 1233   GFRNONAA >60 12/12/2017 0623   GFRNONAA >89 11/15/2015 1758   GFRAA >60 12/12/2017 0623   GFRAA >89 11/15/2015 1758   Lipase  No results found for: LIPASE  Studies/Results: Mr Abdomen W Wo Contrast  Result Date: 12/10/2017 CLINICAL DATA:  Fevers and chills.  Evaluate for liver abscess. EXAM: MRI ABDOMEN WITHOUT AND WITH CONTRAST TECHNIQUE: Multiplanar multisequence MR imaging of the abdomen was performed both before and after the administration of intravenous contrast. CONTRAST:  7 cc of Gadavist. COMPARISON:  12/10/2017 FINDINGS: Lower chest: Small pleural effusions. Hepatobiliary: Within the posterior right lobe of liver there is a mildly T2 hyperintense structure measuring 2 cm, image 49/802. There is mild peripheral hyper enhancement associated with this structure without enhancing internal soft tissue component. No additional focal liver abnormality identified. There is mild diffuse gallbladder wall edema, image 33/5. No gallstones identified. No biliary ductal dilatation. Pancreas: No mass, inflammatory changes, or other parenchymal abnormality identified. Spleen:  Within normal limits in size and appearance. Adrenals/Urinary Tract: Normal appearance of the adrenal glands. Arising from the upper pole of the right kidney is a mild T1 hyperintense and T2 hypointense structure without internal enhancement, image 63/802. No hydronephrosis identified bilaterally.  Stomach/Bowel: Stomach and the visualized upper abdominal bowel loops are unremarkable. Vascular/Lymphatic: No pathologically enlarged lymph nodes identified. No abdominal aortic aneurysm demonstrated. Other:  None. Musculoskeletal: No suspicious bone lesions identified. IMPRESSION: 1. Lesion within the posterior right lobe of liver is again noted with a maximum dimension of 2 cm. There is hyperemia of the surrounding soft tissues. In the acute setting findings may represent a small liver abscess. Benign or malignant primary or metastatic liver lesion is considered less favored but not excluded. Further workup with tissue sampling/needle aspiration versus follow-up imaging after appropriate antibiotic therapy is recommended to confirm benignity. 2. Lesion arising from the upper pole of the right kidney is favored to represent a hemorrhagic/proteinaceous cyst. This is compatible with a benign, Bosniak category 2  lesion. 3. Diffuse gallbladder wall edema.  Cannot rule out cholecystitis. Electronically Signed   By: Kerby Moors M.D.   On: 12/10/2017 18:15      Kalman Drape , [facility] 12/12/2017, 12:41 PM  Pager: [phone] Mon-Wed, Friday 7:00am-4:30pm Thurs 7am-11:30am  Consults: [phone]

## 2017-12-12 NOTE — Progress Notes (Signed)
PROGRESS NOTE    Anne Newton  BHA:[id number] DOB: 27-Aug-1990 DOA: 12/09/2017 PCP: Foye Spurling, MD  Brief Narrative:  The patient is a 27 year old female with papillary thyroid cancer status post thyroidectomy in 2014 on Synthroid replacement presented to the ED with fevers and chills since the morning of admission.   Patient reports that for the past 2 days she was having lower abdominal pain with some vaginal discharge.  Reported having monogamous relationship and unprotected sexual intercourse yesterday.  She denied any nausea, vomiting, diarrhea, shortness of breath, headache or chest discomfort.  Denies any recent travel or sick contact. Denies any weight loss.  In the ED patient was septic with fever of 103 F, hypotensive and tachycardic with WBC of 22K and elevated lactic acid.  Pelvic exam done by ED physician showed pelvic tenderness, wet prep showed clue cells.  UA and chest x-ray were unremarkable.  LFTs were mildly elevated.  Blood cultures obtained and placed on empiric antibiotics for suspected PID.  CT of the abdomen and pelvis showed small amount of pericholecystic fluid without any gallstones.  Also showed a 5.3 cm masslike attenuation in the right hepatic lobe.  Pelvic ultrasound/transvaginal ultrasound was negative for any acute findings. Patient admitted to stepdown unit for sepsis work-up.  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  She also was found to have a liver abscess which was drained by IR and culture is pending for that.  I discussed the case with infectious disease Dr. Tommy Medal who recommended repeating blood cultures and awaiting the cultures from the abscess to narrow therapy.  Dr. Tommy Medal recommended continuing IV ceftriaxone and metronidazole for now.  Assessment & Plan:   Principal Problem:   Sepsis ([place]) Active Problems:   Hypothyroidism   Hypokalemia  Severe sepsis with septic shock  (HCC) -Etiology unclearpossible pelvic inflammatory disease versus Hepatic mass/abscess but suspect it is Related to Gallbladder and Hepatic Abscess. -Antibiotic switched to IV Rocephin and Flagyl to cover for both her abscess and PID.   -Blood culture on admission growing Fusobacterium Necrophorum (both sets of Blood Cx's) -CT and ultrasound abdomen concerning for hepatic mass versus abscess.  No right upper quadrant tenderness or gallstones noted.   -MRI abdomen done showing 2 cm liver abscess lower right hepatic lobe along with diffuse gallbladder wall edema. -Sepsis resolving.   -Continued IV hydration but now stopped,  -C/w empiric antibiotics as PCT is trending down and went from 88.84 -> 19.93 -Patient no longer has a Leukocytosis  -General Surgery consulted who recommended IR consult for aspiration of the liver lesion which was done yesterday 12/11/17. -I discussed with Infectious Diseases Dr. Tommy Medal who recommended repeating Blood Cx and awaiting for Cx and Sensitivities to come back from Liver Abscess -Surgery does not feel that this is acute cholecystitis and the findings on the imaging of the gallbladder inflammation are likely secondary to her liver abscess -Dr. Tommy Medal recommends change to p.o. once sensitivities are known and treated for at least a month -Repeat Cx's ordered and pending   Hypothyroidism -Status post thyroidectomy for papillary thyroid cancer.   -On Synthroid which is continued  Hypokalemia -Improved and went from 3.2 -> 3.6 -Continue to Monitor and Replete as Necessary -Repeat CMP in AM   Transaminitis -Improved -LFT's normalized -Will not repeat CMP in AM but obtain a BMP  Normocytic Anemia -Patient's hemoglobin/hematocrit went from 10.7/34.5 now 11.5/36.2 -Check anemia panel in a.m. -  Continue monitor for signs and symptoms of bleeding -Repeat CBC in the a.m.  Bacterial Vaginosis -Clue Cells noted on Wet Prep -Patient is covered by IV  Metronidazole -Continue IV Metronidazole for now   DVT prophylaxis: None Code Status: FULL CODE Family Communication: No family present at bedside Disposition Plan:   Consultants:   General Surgery   IR  I discussed the case with ID Dr. Tommy Medal   Procedures:  US aspiration R liver lesion 20ml purulent, sent for GS, C&S   Antimicrobials:  Anti-infectives (From admission, onward)   Start     Dose/Rate Route Frequency Ordered Stop   12/11/17 1200  cefTRIAXone (ROCEPHIN) 2 g in sodium chloride 0.9 % 100 mL IVPB     2 g 200 mL/hr over 30 Minutes Intravenous Every 24 hours 12/11/17 0820     12/10/17 0800  vancomycin (VANCOCIN) IVPB 750 mg/150 ml premix  Status:  Discontinued     750 mg 150 mL/hr over 60 Minutes Intravenous Every 12 hours 12/10/17 0631 12/11/17 0820   12/10/17 0600  cefoTEtan (CEFOTAN) 2 g in sodium chloride 0.9 % 100 mL IVPB  Status:  Discontinued     2 g 200 mL/hr over 30 Minutes Intravenous Every 12 hours 12/10/17 0032 12/11/17 0820   12/10/17 0045  doxycycline (VIBRAMYCIN) 100 mg in sodium chloride 0.9 % 250 mL IVPB  Status:  Discontinued     100 mg 125 mL/hr over 120 Minutes Intravenous Every 12 hours 12/10/17 0032 12/11/17 0729   12/09/17 1715  ceFEPIme (MAXIPIME) 2 g in sodium chloride 0.9 % 100 mL IVPB     2 g 200 mL/hr over 30 Minutes Intravenous  Once 12/09/17 1700 12/09/17 1759   12/09/17 1715  metroNIDAZOLE (FLAGYL) IVPB 500 mg     500 mg 100 mL/hr over 60 Minutes Intravenous Every 8 hours 12/09/17 1700     12/09/17 1715  vancomycin (VANCOCIN) IVPB 1000 mg/200 mL premix     1,000 mg 200 mL/hr over 60 Minutes Intravenous  Once 12/09/17 1700 12/09/17 1917     Subjective: Seen and examined at bedside was doing well.  States that she feels back to baseline.  No chest pain, lightheadedness or dizziness.  Denies any abdominal pain.  No nausea or vomiting.  No other concerns or complaints at this time  Objective: Vitals:   12/11/17 2109 12/12/17 0503  12/12/17 0505 12/12/17 1933  BP: 91/66 (!) 88/60 (!) 87/57 102/69  Pulse: 74 79 76 69  Resp: 18 19 20 16   Temp: 98.4 F (36.9 C) (!) 97.5 F (36.4 C)  98.6 F (37 C)  TempSrc: Oral Oral  Oral  SpO2: 98% 100% 100% 100%  Weight:      Height:        Intake/Output Summary (Last 24 hours) at 12/12/2017 1949 Last data filed at 12/12/2017 1000 Gross per 24 hour  Intake 240 ml  Output -  Net 240 ml   Filed Weights   12/10/17 0121 12/11/17 0500  Weight: 56 kg 54.8 kg   Examination: Physical Exam:  Constitutional: WN/WD female in NAD and appears calm and comfortable Eyes:  Lids and conjunctivae normal, sclerae anicteric  ENMT: External Ears, Nose appear normal. Grossly normal hearing. Mucous membranes are moist.  Neck: Appears normal, supple, no cervical masses, normal ROM, no appreciable thyromegaly; no JVD Respiratory: Diminished to auscultation bilaterally, no wheezing, rales, rhonchi or crackles. Normal respiratory effort and patient is not tachypenic. No accessory muscle use.  Cardiovascular: RRR,  no murmurs / rubs / gallops. S1 and S2 auscultated. No extremity edema. Abdomen: Soft, non-tender, non-distended. No masses palpated. No appreciable hepatosplenomegaly. Bowel sounds positive x4.  GU: Deferred. Musculoskeletal: No clubbing / cyanosis of digits/nails. No joint deformity upper and lower extremities. Good ROM, no  Skin: No rashes, lesions, ulcers on a limited skin evaluation. No induration; Warm and dry.  Neurologic: CN 2-12 grossly intact with no focal deficits. Romberg sign and cerebellar reflexes not assessed.  Psychiatric: Normal judgment and insight. Alert and oriented x 3. Normal mood and appropriate affect.   Data Reviewed: I have personally reviewed following labs and imaging studies  CBC: Recent Labs  Lab 12/09/17 1649 12/10/17 0112 12/10/17 0305 12/11/17 0235 12/12/17 0623  WBC 8.6 22.0* 19.8* 13.1* 8.6  NEUTROABS 8.0*  --  18.4*  --   --   HGB 13.8  10.8* 10.9* 10.7* 11.5*  HCT 45.2 36.8 35.3* 34.5* 36.2  MCV 88.8 95.8 92.4 88.9 89.2  PLT 280 213 188 162 539   Basic Metabolic Panel: Recent Labs  Lab 12/09/17 1649 12/10/17 0112 12/10/17 0305 12/11/17 0235 12/12/17 0623  NA 141  --  141 140 137  K 3.3*  --  3.7 3.2* 3.6  CL 103  --  116* 113* 106  CO2 24  --  19* 19* 23  GLUCOSE 113*  --  126* 103* 85  BUN 18  --  11 5* 5*  CREATININE 0.90 0.75 0.65 0.50 0.66  CALCIUM 9.4  --  7.3* 7.8* 8.6*   GFR: Estimated Creatinine Clearance: 81.1 mL/min (by C-G formula based on SCr of 0.66 mg/dL). Liver Function Tests: Recent Labs  Lab 12/09/17 1649 12/10/17 0305 12/11/17 0235 12/12/17 0823  AST 77* 80* 36 21  ALT 50* 66* 49* 37  ALKPHOS 104 74 66 62  BILITOT 1.6* 1.5* 1.3* 0.9  PROT 8.4* 5.8* 6.0* 6.6  ALBUMIN 4.3 2.8* 3.1* 3.1*   No results for input(s): LIPASE, AMYLASE in the last 168 hours. No results for input(s): AMMONIA in the last 168 hours. Coagulation Profile: Recent Labs  Lab 12/09/17 1649 12/10/17 0730  INR 1.05 1.31   Cardiac Enzymes: No results for input(s): CKTOTAL, CKMB, CKMBINDEX, TROPONINI in the last 168 hours. BNP (last 3 results) No results for input(s): PROBNP in the last 8760 hours. HbA1C: No results for input(s): HGBA1C in the last 72 hours. CBG: No results for input(s): GLUCAP in the last 168 hours. Lipid Profile: No results for input(s): CHOL, HDL, LDLCALC, TRIG, CHOLHDL, LDLDIRECT in the last 72 hours. Thyroid Function Tests: No results for input(s): TSH, T4TOTAL, FREET4, T3FREE, THYROIDAB in the last 72 hours. Anemia Panel: No results for input(s): VITAMINB12, FOLATE, FERRITIN, TIBC, IRON, RETICCTPCT in the last 72 hours. Sepsis Labs: Recent Labs  Lab 12/09/17 1656 12/09/17 2053 12/10/17 0112 12/10/17 0305 12/12/17 0823  PROCALCITON  --   --  88.84  --  19.93  LATICACIDVEN 4.64* 1.14 1.4 1.5  --     Recent Results (from the past 240 hour(s))  Culture, blood (Routine x 2)      Status: Abnormal   Collection Time: 12/09/17  4:49 PM  Result Value Ref Range Status   Specimen Description   Final    LEFT ANTECUBITAL Performed at [facility], [place] [address] [place], [place] [address]    Special Requests   Final    BOTTLES DRAWN AEROBIC AND ANAEROBIC Blood Culture results may not be optimal due to an excessive volume  of blood received in culture bottles Performed at [facility], [place] [address], [place], [place] [address]    Culture  Setup Time   Final    GRAM NEGATIVE RODS ANAEROBIC BOTTLE ONLY CRITICAL RESULT CALLED TO, READ BACK BY AND VERIFIED WITH: Colin Rhein PHARMD [id number] 12/11/17 A BROWNING    Culture (A)  Final    FUSOBACTERIUM NECROPHORUM BETA LACTAMASE POSITIVE Performed at [facility], [place] [address], [place], [place] [address]    Report Status 12/12/2017 FINAL  Final  Group A Strep by PCR     Status: None   Collection Time: 12/09/17  4:58 PM  Result Value Ref Range Status   Group A Strep by PCR NOT DETECTED NOT DETECTED Final    Comment: Performed at [facility], [place] [address] [place], [place] [address]  Culture, blood (Routine x 2)     Status: Abnormal   Collection Time: 12/09/17  5:07 PM  Result Value Ref Range Status   Specimen Description   Final    RIGHT ANTECUBITAL Performed at [place] [address] [place], [place] [address]    Special Requests   Final    BOTTLES DRAWN AEROBIC AND ANAEROBIC Blood Culture adequate volume   Culture  Setup Time   Final    ANAEROBIC BOTTLE ONLY GRAM NEGATIVE RODS CRITICAL RESULT CALLED TO, READ BACK BY AND VERIFIED WITH: Colin Rhein PHARMD 0104 12/11/17 A BROWNING    Culture (A)  Final    FUSOBACTERIUM NECROPHORUM BETA LACTAMASE POSITIVE Performed at [facility], [place] [address], [place], [place] [address]    Report Status 12/12/2017 FINAL  Final  Blood Culture ID Panel (Reflexed)     Status: None   Collection  Time: 12/09/17  5:07 PM  Result Value Ref Range Status   Enterococcus species NOT DETECTED NOT DETECTED Final   Listeria monocytogenes NOT DETECTED NOT DETECTED Final   Staphylococcus species NOT DETECTED NOT DETECTED Final   Staphylococcus aureus (BCID) NOT DETECTED NOT DETECTED Final   Streptococcus species NOT DETECTED NOT DETECTED Final   Streptococcus agalactiae NOT DETECTED NOT DETECTED Final   Streptococcus pneumoniae NOT DETECTED NOT DETECTED Final   Streptococcus pyogenes NOT DETECTED NOT DETECTED Final   Acinetobacter baumannii NOT DETECTED NOT DETECTED Final   Enterobacteriaceae species NOT DETECTED NOT DETECTED Final   Enterobacter cloacae complex NOT DETECTED NOT DETECTED Final   Escherichia coli NOT DETECTED NOT DETECTED Final   Klebsiella oxytoca NOT DETECTED NOT DETECTED Final   Klebsiella pneumoniae NOT DETECTED NOT DETECTED Final   Proteus species NOT DETECTED NOT DETECTED Final   Serratia marcescens NOT DETECTED NOT DETECTED Final   Haemophilus influenzae NOT DETECTED NOT DETECTED Final   Neisseria meningitidis NOT DETECTED NOT DETECTED Final   Pseudomonas aeruginosa NOT DETECTED NOT DETECTED Final   Candida albicans NOT DETECTED NOT DETECTED Final   Candida glabrata NOT DETECTED NOT DETECTED Final   Candida krusei NOT DETECTED NOT DETECTED Final   Candida parapsilosis NOT DETECTED NOT DETECTED Final   Candida tropicalis NOT DETECTED NOT DETECTED Final    Comment: Performed at [facility], [place] [address], [place],  [address]  Wet prep, genital     Status: Abnormal   Collection Time: 12/09/17  5:11 PM  Result Value Ref Range Status   Yeast Wet Prep HPF POC FEW (A) NONE SEEN Final   Trich, Wet Prep NONE SEEN NONE SEEN Final  Clue Cells Wet Prep HPF POC PRESENT (A) NONE SEEN Final   WBC, Wet Prep HPF POC MANY (A) NONE SEEN Final   Sperm NONE SEEN  Final    Comment: Performed at [facility], [place] [address] [place], [place]  [address]  MRSA PCR Screening     Status: None   Collection Time: 12/10/17 12:12 AM  Result Value Ref Range Status   MRSA by PCR NEGATIVE NEGATIVE Final    Comment:        The GeneXpert MRSA Assay (FDA approved for NASAL specimens only), is one component of a comprehensive MRSA colonization surveillance program. It is not intended to diagnose MRSA infection nor to guide or monitor treatment for MRSA infections. Performed at [facility], [place] [address] [place], [place] [address]     Radiology Studies: No results found.  Scheduled Meds: . enoxaparin (LOVENOX) injection  40 mg Subcutaneous Q24H  . levothyroxine  88 mcg Oral QAC breakfast   Continuous Infusions: . cefTRIAXone (ROCEPHIN)  IV 2 g (12/12/17 1139)  . metronidazole 500 mg (12/12/17 1646)    LOS: 3 days   Kerney Elbe, DO [facility] PAGER is on [place]  If 7PM-7AM, please contact night-coverage [email or web address] Password [facility] 12/12/2017, 7:49 PM

## 2017-12-13 LAB — CBC WITH DIFFERENTIAL/PLATELET
Abs Immature Granulocytes: 0.05 10*3/uL (ref 0.00–0.07)
BASOS ABS: 0 10*3/uL (ref 0.0–0.1)
BASOS PCT: 0 %
EOS PCT: 5 %
Eosinophils Absolute: 0.3 10*3/uL (ref 0.0–0.5)
HCT: 38.6 % (ref 36.0–46.0)
Hemoglobin: 12.1 g/dL (ref 12.0–15.0)
Immature Granulocytes: 1 %
LYMPHS PCT: 22 %
Lymphs Abs: 1.5 10*3/uL (ref 0.7–4.0)
MCH: 27.9 pg (ref 26.0–34.0)
MCHC: 31.3 g/dL (ref 30.0–36.0)
MCV: 89.1 fL (ref 80.0–100.0)
Monocytes Absolute: 0.7 10*3/uL (ref 0.1–1.0)
Monocytes Relative: 10 %
NRBC: 0 % (ref 0.0–0.2)
Neutro Abs: 4.1 10*3/uL (ref 1.7–7.7)
Neutrophils Relative %: 62 %
PLATELETS: 215 10*3/uL (ref 150–400)
RBC: 4.33 MIL/uL (ref 3.87–5.11)
RDW: 12.9 % (ref 11.5–15.5)
WBC: 6.6 10*3/uL (ref 4.0–10.5)

## 2017-12-13 LAB — COMPREHENSIVE METABOLIC PANEL
ALBUMIN: 3.3 g/dL — AB (ref 3.5–5.0)
ALT: 30 U/L (ref 0–44)
ANION GAP: 11 (ref 5–15)
AST: 22 U/L (ref 15–41)
Alkaline Phosphatase: 64 U/L (ref 38–126)
BUN: 5 mg/dL — ABNORMAL LOW (ref 6–20)
CO2: 23 mmol/L (ref 22–32)
Calcium: 8.7 mg/dL — ABNORMAL LOW (ref 8.9–10.3)
Chloride: 104 mmol/L (ref 98–111)
Creatinine, Ser: 0.53 mg/dL (ref 0.44–1.00)
GFR calc non Af Amer: >60 mL/min (ref >60)
GLUCOSE: 86 mg/dL (ref 70–99)
POTASSIUM: 3.5 mmol/L (ref 3.5–5.1)
Sodium: 138 mmol/L (ref 135–145)
TOTAL PROTEIN: 7 g/dL (ref 6.5–8.1)
Total Bilirubin: 0.7 mg/dL (ref 0.3–1.2)

## 2017-12-13 LAB — MAGNESIUM: Magnesium: 2 mg/dL (ref 1.7–2.4)

## 2017-12-13 LAB — PHOSPHORUS: Phosphorus: 3.9 mg/dL (ref 2.5–4.6)

## 2017-12-13 LAB — PROCALCITONIN: PROCALCITONIN: 10.18 ng/mL

## 2017-12-13 MED ORDER — POTASSIUM CHLORIDE CRYS ER 20 MEQ PO TBCR
40.0000 meq | EXTENDED_RELEASE_TABLET | Freq: Once | ORAL | Status: AC
  Administered 2017-12-13: 40 meq via ORAL
  Filled 2017-12-13: qty 2

## 2017-12-13 MED ORDER — LIP MEDEX EX OINT
TOPICAL_OINTMENT | CUTANEOUS | Status: AC
  Administered 2017-12-13: 17:00:00
  Filled 2017-12-13: qty 7

## 2017-12-13 NOTE — Final Consult Note (Signed)
Consultant Final Sign-Off Note    Assessment/Final recommendations  Anne Newton is a 27 y.o. female followed by me for possible cholecystitis.   Wound care (if applicable):    Diet at discharge: per primary team   Activity at discharge: per primary team   Follow-up appointment:  Dr. Hassell Done 3 weeks    Pending results:  Unresulted Labs (From admission, onward)    Start     Ordered   12/13/17 0500  Procalcitonin  Daily,   R    Question:  Specimen collection method  Answer:  Lab=Lab collect   12/12/17 0815   12/12/17 1252  Culture, blood (routine x 2)  BLOOD CULTURE X 2,   R     12/12/17 1251   12/11/17 1601  Aerobic/Anaerobic Culture (surgical/deep wound)  Once,   R    Question:  Patient immune status  Answer:  Normal   12/11/17 1601           Medication recommendations: antibiotics per ID, nothing else from surgical standpoint    Other recommendations:     Thank you for allowing [place] to participate in the care of your patient!  Please consult [place] again if you have further needs for your patient.  Kalman Drape 12/13/2017 8:42 AM    Subjective   CC: sepsis, liver abscess  Pt states no issues overnight. She is tolerating her diet. She is not having abdominal pain, nausea, vomiting. She wants to go home.   Objective  Vital signs in last 24 hours: Temp:  [98.4 F (36.9 C)-98.6 F (37 C)] 98.4 F (36.9 C) (11/21 0417) Pulse Rate:  [69-76] 76 (11/21 0417) Resp:  [16-18] 18 (11/21 0417) BP: (102-104)/(63-69) 104/63 (11/21 0417) SpO2:  [99 %-100 %] 99 % (11/21 0417)  PE: Gen:  Alert, NAD, pleasant, cooperative Pulm:  Rate and effort normal Abd: Soft, ND, +BS, no TTP, no peritonitis  Skin: no rashes noted, warm and dry  Pertinent labs and Studies: Recent Labs    12/11/17 0235 12/12/17 0623 12/13/17 0606  WBC 13.1* 8.6 6.6  HGB 10.7* 11.5* 12.1  HCT 34.5* 36.2 38.6   BMET Recent Labs    12/12/17 0623 12/13/17 0606  NA 137 138  K 3.6 3.5  CL 106  104  CO2 23 23  GLUCOSE 85 86  BUN 5* 5*  CREATININE 0.66 0.53  CALCIUM 8.6* 8.7*   No results for input(s): LABURIN in the last 72 hours. Results for orders placed or performed during the hospital encounter of 12/09/17  Culture, blood (Routine x 2)     Status: Abnormal   Collection Time: 12/09/17  4:49 PM  Result Value Ref Range Status   Specimen Description   Final    LEFT ANTECUBITAL Performed at [place] [address] [place], [place] [address]    Special Requests   Final    BOTTLES DRAWN AEROBIC AND ANAEROBIC Blood Culture results may not be optimal due to an excessive volume of blood received in culture bottles Performed at [place] [address] [place], [place] [address]    Culture  Setup Time   Final    GRAM NEGATIVE RODS ANAEROBIC BOTTLE ONLY CRITICAL RESULT CALLED TO, READ BACK BY AND VERIFIED WITH: Colin Rhein PHARMD [id number] 12/11/17 A BROWNING    Culture (A)  Final    FUSOBACTERIUM NECROPHORUM BETA LACTAMASE POSITIVE Performed at [facility], [place] [address] [place], [place] [address]  Report Status 12/12/2017 FINAL  Final  Group A Strep by PCR     Status: None   Collection Time: 12/09/17  4:58 PM  Result Value Ref Range Status   Group A Strep by PCR NOT DETECTED NOT DETECTED Final    Comment: Performed at [facility], [place] [address], [place], [place] [address]  Culture, blood (Routine x 2)     Status: Abnormal   Collection Time: 12/09/17  5:07 PM  Result Value Ref Range Status   Specimen Description   Final    RIGHT ANTECUBITAL Performed at [place] [address] [place], [place] [address]    Special Requests   Final    BOTTLES DRAWN AEROBIC AND ANAEROBIC Blood Culture adequate volume   Culture  Setup Time   Final    ANAEROBIC BOTTLE ONLY GRAM NEGATIVE RODS CRITICAL RESULT CALLED TO, READ BACK BY AND VERIFIED WITH: Colin Rhein PHARMD 0104 12/11/17 A BROWNING     Culture (A)  Final    FUSOBACTERIUM NECROPHORUM BETA LACTAMASE POSITIVE Performed at [facility], [place] [address] [place], [place] [address]    Report Status 12/12/2017 FINAL  Final  Blood Culture ID Panel (Reflexed)     Status: None   Collection Time: 12/09/17  5:07 PM  Result Value Ref Range Status   Enterococcus species NOT DETECTED NOT DETECTED Final   Listeria monocytogenes NOT DETECTED NOT DETECTED Final   Staphylococcus species NOT DETECTED NOT DETECTED Final   Staphylococcus aureus (BCID) NOT DETECTED NOT DETECTED Final   Streptococcus species NOT DETECTED NOT DETECTED Final   Streptococcus agalactiae NOT DETECTED NOT DETECTED Final   Streptococcus pneumoniae NOT DETECTED NOT DETECTED Final   Streptococcus pyogenes NOT DETECTED NOT DETECTED Final   Acinetobacter baumannii NOT DETECTED NOT DETECTED Final   Enterobacteriaceae species NOT DETECTED NOT DETECTED Final   Enterobacter cloacae complex NOT DETECTED NOT DETECTED Final   Escherichia coli NOT DETECTED NOT DETECTED Final   Klebsiella oxytoca NOT DETECTED NOT DETECTED Final   Klebsiella pneumoniae NOT DETECTED NOT DETECTED Final   Proteus species NOT DETECTED NOT DETECTED Final   Serratia marcescens NOT DETECTED NOT DETECTED Final   Haemophilus influenzae NOT DETECTED NOT DETECTED Final   Neisseria meningitidis NOT DETECTED NOT DETECTED Final   Pseudomonas aeruginosa NOT DETECTED NOT DETECTED Final   Candida albicans NOT DETECTED NOT DETECTED Final   Candida glabrata NOT DETECTED NOT DETECTED Final   Candida krusei NOT DETECTED NOT DETECTED Final   Candida parapsilosis NOT DETECTED NOT DETECTED Final   Candida tropicalis NOT DETECTED NOT DETECTED Final    Comment: Performed at [facility], [place] [address], [place], [place] [address]  Wet prep, genital     Status: Abnormal   Collection Time: 12/09/17  5:11 PM  Result Value Ref Range Status   Yeast Wet Prep HPF POC FEW (A) NONE SEEN Final   Trich, Wet Prep  NONE SEEN NONE SEEN Final   Clue Cells Wet Prep HPF POC PRESENT (A) NONE SEEN Final   WBC, Wet Prep HPF POC MANY (A) NONE SEEN Final   Sperm NONE SEEN  Final    Comment: Performed at [facility], [place] [address], [place], [place] [address]  MRSA PCR Screening     Status: None   Collection Time: 12/10/17 12:12 AM  Result Value Ref Range Status   MRSA by PCR NEGATIVE NEGATIVE Final    Comment:  The GeneXpert MRSA Assay (FDA approved for NASAL specimens only), is one component of a comprehensive MRSA colonization surveillance program. It is not intended to diagnose MRSA infection nor to guide or monitor treatment for MRSA infections. Performed at [facility], [place] [address], [place], [place] [address]     Imaging: No results found.

## 2017-12-13 NOTE — Progress Notes (Signed)
PROGRESS NOTE    Anne Newton  ERD:[id number] DOB: 1990/07/26 DOA: 12/09/2017 PCP: Foye Spurling, MD  Brief Narrative:  The patient is a 27 year old female with papillary thyroid cancer status post thyroidectomy in 2014 on Synthroid replacement presented to the ED with fevers and chills since the morning of admission.   Patient reports that for the past 2 days she was having lower abdominal pain with some vaginal discharge.  Reported having monogamous relationship and unprotected sexual intercourse yesterday.  She denied any nausea, vomiting, diarrhea, shortness of breath, headache or chest discomfort.  Denies any recent travel or sick contact. Denies any weight loss.  In the ED patient was septic with fever of 103 F, hypotensive and tachycardic with WBC of 22K and elevated lactic acid.  Pelvic exam done by ED physician showed pelvic tenderness, wet prep showed clue cells.  UA and chest x-ray were unremarkable.  LFTs were mildly elevated.  Blood cultures obtained and placed on empiric antibiotics for suspected PID.  CT of the abdomen and pelvis showed small amount of pericholecystic fluid without any gallstones.  Also showed a 5.3 cm masslike attenuation in the right hepatic lobe.  Pelvic ultrasound/transvaginal ultrasound was negative for any acute findings. Patient admitted to stepdown unit for sepsis work-up.  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  She also was found to have a liver abscess which was drained by IR and culture is pending for that but Gram Stain did show No WBC Seen and No Organisms seen.  I discussed the case with infectious disease Dr. Tommy Medal who recommended repeating blood cultures and awaiting the cultures from the abscess to narrow therapy.  Dr. Tommy Medal recommended continuing IV ceftriaxone and metronidazole for now.  Assessment & Plan:   Principal Problem:   Sepsis ([place]) Active Problems:    Hypothyroidism   Hypokalemia  Severe sepsis with septic shock ([place]), improved  -Etiology unclear possible pelvic inflammatory disease versus Hepatic mass/abscess but suspect it is Related to Gallbladder and Hepatic Abscess. -Antibiotic switched to IV Rocephin and Flagyl to cover for both her abscess and PID.   -Blood culture on admission growing Fusobacterium Necrophorum (both sets of Blood Cx's) -CT and ultrasound abdomen concerning for hepatic mass versus abscess.  No right upper quadrant tenderness or gallstones noted.   -MRI abdomen done showing 2 cm liver abscess lower right hepatic lobe along with diffuse gallbladder wall edema. -Sepsis resolving.   -Continued IV hydration but now stopped,  -C/w empiric antibiotics as PCT is trending down and went from 88.84 -> 19.93 -> 10.18 -Patient no longer has a Leukocytosis as WBC is 6.6 -General Surgery consulted who recommended IR consult for aspiration of the liver lesion which was done 12/11/17. -I discussed the case with Infectious Diseases Dr. Tommy Medal who recommended repeating Blood Cx and awaiting for Cx and Sensitivities to come back from Liver Abscess -Surgery does not feel that this is acute cholecystitis and the findings on the imaging of the gallbladder inflammation are likely secondary to her liver abscess -Dr. Tommy Medal recommends change to p.o. once sensitivities are known and treated for at least a month -Repeat Blood Cx's x2 show NGTD at <24 hours and Aerobic/Anaerobic Gram Stain from Abscess Aspirate shows No Organisms and No WBC's but Cx is Re-incubated for Better Growth  Hypothyroidism -Status post thyroidectomy for papillary thyroid cancer.   -On Levothyroxine 88 mcg po Daily  Hypokalemia -Improved and is now  3.5 -Replete with po Potassium Chloride 40 mEQ -Continue to Monitor and Replete as Necessary -Repeat CMP in AM   Transaminitis -Improved -LFT's normalized and AST was 22 and ALT is 30 -Will not repeat CMP in AM but  will obtain BMP  Normocytic Anemia -Patient's hemoglobin/hematocrit went from 10.7/34.5 now 12.1/38.6 -Continue monitor for signs and symptoms of bleeding -Repeat CBC in the a.m.  Bacterial Vaginosis -Clue Cells noted on Wet Prep -Patient is covered by IV Metronidazole -Continue IV Metronidazole for now (Day 5)  DVT prophylaxis: None Code Status: FULL CODE Family Communication: No family present at bedside Disposition Plan: Anticipate D/C in the next 24-48 hours once Cultures result   Consultants:   General Surgery   IR  I discussed the case with ID Dr. Tommy Medal   Procedures:  US aspiration R liver lesion 36ml purulent, sent for GS, C&S   Antimicrobials:  Anti-infectives (From admission, onward)   Start     Dose/Rate Route Frequency Ordered Stop   12/11/17 1200  cefTRIAXone (ROCEPHIN) 2 g in sodium chloride 0.9 % 100 mL IVPB     2 g 200 mL/hr over 30 Minutes Intravenous Every 24 hours 12/11/17 0820     12/10/17 0800  vancomycin (VANCOCIN) IVPB 750 mg/150 ml premix  Status:  Discontinued     750 mg 150 mL/hr over 60 Minutes Intravenous Every 12 hours 12/10/17 0631 12/11/17 0820   12/10/17 0600  cefoTEtan (CEFOTAN) 2 g in sodium chloride 0.9 % 100 mL IVPB  Status:  Discontinued     2 g 200 mL/hr over 30 Minutes Intravenous Every 12 hours 12/10/17 0032 12/11/17 0820   12/10/17 0045  doxycycline (VIBRAMYCIN) 100 mg in sodium chloride 0.9 % 250 mL IVPB  Status:  Discontinued     100 mg 125 mL/hr over 120 Minutes Intravenous Every 12 hours 12/10/17 0032 12/11/17 0729   12/09/17 1715  ceFEPIme (MAXIPIME) 2 g in sodium chloride 0.9 % 100 mL IVPB     2 g 200 mL/hr over 30 Minutes Intravenous  Once 12/09/17 1700 12/09/17 1759   12/09/17 1715  metroNIDAZOLE (FLAGYL) IVPB 500 mg     500 mg 100 mL/hr over 60 Minutes Intravenous Every 8 hours 12/09/17 1700     12/09/17 1715  vancomycin (VANCOCIN) IVPB 1000 mg/200 mL premix     1,000 mg 200 mL/hr over 60 Minutes Intravenous  Once  12/09/17 1700 12/09/17 1917     Subjective: Seen and examined at bedside and had no complaints and felt well. Wanting to go home. No nausea, vomiting, CP, or SOB. No other concerns or complaints at this time.   Objective: Vitals:   12/12/17 0505 12/12/17 1933 12/13/17 0417 12/13/17 1443  BP: (!) 87/57 102/69 104/63 103/71  Pulse: 76 69 76 71  Resp: 20 16 18 16   Temp:  98.6 F (37 C) 98.4 F (36.9 C) 98.2 F (36.8 C)  TempSrc:  Oral Oral   SpO2: 100% 100% 99% 98%  Weight:      Height:        Intake/Output Summary (Last 24 hours) at 12/13/2017 1701 Last data filed at 12/13/2017 1600 Gross per 24 hour  Intake 980 ml  Output 700 ml  Net 280 ml   Filed Weights   12/10/17 0121 12/11/17 0500  Weight: 56 kg 54.8 kg   Examination: Physical Exam:  Constitutional: Well-nourished, well-developed female in no acute distress appears calm and comfortable wanting to go home Eyes: Lids and conjunctive are  normal.  Sclera anicteric ENMT: External ears and nose appear normal.  Mucous members are moist Neck: Appears supple with no JVD Respiratory: Clear to auscultation bilaterally no appreciable wheezing, rales, rhonchi.  Patient not tachypneic wheezing excess muscle breathe Cardiovascular: Regular rate and rhythm.  No appreciable murmurs, rubs or gallops.  No lower extremity edema noted Abdomen: Soft, nontender, nondistended.  Bowel sounds present all 4 quadrants GU: Deferred Musculoskeletal: No clubbing or cyanosis.  No joint deformities noted in the upper looks remedies Skin: No appreciable rashes or lesions limited skin evaluation but does have scattered tattoos throughout body. Neurologic: Cranial nerves II through XII grossly intact no appreciable focal deficits. Psychiatric: Normal mood and affect.  Intact judgment and insight.  Patient is awake, alert and oriented x3  Data Reviewed: I have personally reviewed following labs and imaging studies  CBC: Recent Labs  Lab  12/09/17 1649 12/10/17 0112 12/10/17 0305 12/11/17 0235 12/12/17 0623 12/13/17 0606  WBC 8.6 22.0* 19.8* 13.1* 8.6 6.6  NEUTROABS 8.0*  --  18.4*  --   --  4.1  HGB 13.8 10.8* 10.9* 10.7* 11.5* 12.1  HCT 45.2 36.8 35.3* 34.5* 36.2 38.6  MCV 88.8 95.8 92.4 88.9 89.2 89.1  PLT 280 213 188 162 186 401   Basic Metabolic Panel: Recent Labs  Lab 12/09/17 1649 12/10/17 0112 12/10/17 0305 12/11/17 0235 12/12/17 0623 12/13/17 0606  NA 141  --  141 140 137 138  K 3.3*  --  3.7 3.2* 3.6 3.5  CL 103  --  116* 113* 106 104  CO2 24  --  19* 19* 23 23  GLUCOSE 113*  --  126* 103* 85 86  BUN 18  --  11 5* 5* 5*  CREATININE 0.90 0.75 0.65 0.50 0.66 0.53  CALCIUM 9.4  --  7.3* 7.8* 8.6* 8.7*  MG  --   --   --   --   --  2.0  PHOS  --   --   --   --   --  3.9   GFR: Estimated Creatinine Clearance: 81.1 mL/min (by C-G formula based on SCr of 0.53 mg/dL). Liver Function Tests: Recent Labs  Lab 12/09/17 1649 12/10/17 0305 12/11/17 0235 12/12/17 0823 12/13/17 0606  AST 77* 80* 36 21 22  ALT 50* 66* 49* 37 30  ALKPHOS 104 74 66 62 64  BILITOT 1.6* 1.5* 1.3* 0.9 0.7  PROT 8.4* 5.8* 6.0* 6.6 7.0  ALBUMIN 4.3 2.8* 3.1* 3.1* 3.3*   No results for input(s): LIPASE, AMYLASE in the last 168 hours. No results for input(s): AMMONIA in the last 168 hours. Coagulation Profile: Recent Labs  Lab 12/09/17 1649 12/10/17 0730  INR 1.05 1.31   Cardiac Enzymes: No results for input(s): CKTOTAL, CKMB, CKMBINDEX, TROPONINI in the last 168 hours. BNP (last 3 results) No results for input(s): PROBNP in the last 8760 hours. HbA1C: No results for input(s): HGBA1C in the last 72 hours. CBG: No results for input(s): GLUCAP in the last 168 hours. Lipid Profile: No results for input(s): CHOL, HDL, LDLCALC, TRIG, CHOLHDL, LDLDIRECT in the last 72 hours. Thyroid Function Tests: No results for input(s): TSH, T4TOTAL, FREET4, T3FREE, THYROIDAB in the last 72 hours. Anemia Panel: No results for  input(s): VITAMINB12, FOLATE, FERRITIN, TIBC, IRON, RETICCTPCT in the last 72 hours. Sepsis Labs: Recent Labs  Lab 12/09/17 1656 12/09/17 2053 12/10/17 0112 12/10/17 0305 12/12/17 0823 12/13/17 0606  PROCALCITON  --   --  88.84  --  19.93 10.18  LATICACIDVEN 4.64* 1.14 1.4 1.5  --   --     Recent Results (from the past 240 hour(s))  Culture, blood (Routine x 2)     Status: Abnormal   Collection Time: 12/09/17  4:49 PM  Result Value Ref Range Status   Specimen Description   Final    LEFT ANTECUBITAL Performed at [facility] [address] [place], [place] [address]    Special Requests   Final    BOTTLES DRAWN AEROBIC AND ANAEROBIC Blood Culture results may not be optimal due to an excessive volume of blood received in culture bottles Performed at [facility] [address], [place], [place] [address]    Culture  Setup Time   Final    GRAM NEGATIVE RODS ANAEROBIC BOTTLE ONLY CRITICAL RESULT CALLED TO, READ BACK BY AND VERIFIED WITH: Colin Rhein PHARMD 0938 12/11/17 A BROWNING    Culture (A)  Final    FUSOBACTERIUM NECROPHORUM BETA LACTAMASE POSITIVE Performed at [facility], [place] [address], [place], [place] [address]    Report Status 12/12/2017 FINAL  Final  Group A Strep by PCR     Status: None   Collection Time: 12/09/17  4:58 PM  Result Value Ref Range Status   Group A Strep by PCR NOT DETECTED NOT DETECTED Final    Comment: Performed at [facility], [place] [address] [place], [place] [address]  Culture, blood (Routine x 2)     Status: Abnormal   Collection Time: 12/09/17  5:07 PM  Result Value Ref Range Status   Specimen Description   Final    RIGHT ANTECUBITAL Performed at [facility] [address], [place], [place] [address]    Special Requests   Final    BOTTLES DRAWN AEROBIC AND ANAEROBIC Blood Culture adequate volume   Culture  Setup Time   Final    ANAEROBIC BOTTLE  ONLY GRAM NEGATIVE RODS CRITICAL RESULT CALLED TO, READ BACK BY AND VERIFIED WITH: Colin Rhein PHARMD 0104 12/11/17 A BROWNING    Culture (A)  Final    FUSOBACTERIUM NECROPHORUM BETA LACTAMASE POSITIVE Performed at [facility], [place] [address], [place], [place] [address]    Report Status 12/12/2017 FINAL  Final  Blood Culture ID Panel (Reflexed)     Status: None   Collection Time: 12/09/17  5:07 PM  Result Value Ref Range Status   Enterococcus species NOT DETECTED NOT DETECTED Final   Listeria monocytogenes NOT DETECTED NOT DETECTED Final   Staphylococcus species NOT DETECTED NOT DETECTED Final   Staphylococcus aureus (BCID) NOT DETECTED NOT DETECTED Final   Streptococcus species NOT DETECTED NOT DETECTED Final   Streptococcus agalactiae NOT DETECTED NOT DETECTED Final   Streptococcus pneumoniae NOT DETECTED NOT DETECTED Final   Streptococcus pyogenes NOT DETECTED NOT DETECTED Final   Acinetobacter baumannii NOT DETECTED NOT DETECTED Final   Enterobacteriaceae species NOT DETECTED NOT DETECTED Final   Enterobacter cloacae complex NOT DETECTED NOT DETECTED Final   Escherichia coli NOT DETECTED NOT DETECTED Final   Klebsiella oxytoca NOT DETECTED NOT DETECTED Final   Klebsiella pneumoniae NOT DETECTED NOT DETECTED Final   Proteus species NOT DETECTED NOT DETECTED Final   Serratia marcescens NOT DETECTED NOT DETECTED Final   Haemophilus influenzae NOT DETECTED NOT DETECTED Final   Neisseria meningitidis NOT DETECTED NOT DETECTED Final   Pseudomonas aeruginosa NOT DETECTED NOT DETECTED Final   Candida albicans NOT DETECTED NOT  DETECTED Final   Candida glabrata NOT DETECTED NOT DETECTED Final   Candida krusei NOT DETECTED NOT DETECTED Final   Candida parapsilosis NOT DETECTED NOT DETECTED Final   Candida tropicalis NOT DETECTED NOT DETECTED Final    Comment: Performed at [facility], [place] [address] [place], [place] [address]  Wet prep, genital     Status: Abnormal    Collection Time: 12/09/17  5:11 PM  Result Value Ref Range Status   Yeast Wet Prep HPF POC FEW (A) NONE SEEN Final   Trich, Wet Prep NONE SEEN NONE SEEN Final   Clue Cells Wet Prep HPF POC PRESENT (A) NONE SEEN Final   WBC, Wet Prep HPF POC MANY (A) NONE SEEN Final   Sperm NONE SEEN  Final    Comment: Performed at [facility], [place] [address], [place], [place] [address]  MRSA PCR Screening     Status: None   Collection Time: 12/10/17 12:12 AM  Result Value Ref Range Status   MRSA by PCR NEGATIVE NEGATIVE Final    Comment:        The GeneXpert MRSA Assay (FDA approved for NASAL specimens only), is one component of a comprehensive MRSA colonization surveillance program. It is not intended to diagnose MRSA infection nor to guide or monitor treatment for MRSA infections. Performed at [facility], [place] [address], [place], [place] [address]   Aerobic/Anaerobic Culture (surgical/deep wound)     Status: None (Preliminary result)   Collection Time: 12/11/17  4:01 PM  Result Value Ref Range Status   Specimen Description   Final    LIVER Performed at [facility], [place] [address] [place], [place] [address]    Special Requests   Final    Normal Performed at [facility], [place] [address], [place], [place] [address]    Gram Stain   Final    NO WBC SEEN NO ORGANISMS SEEN Performed at [facility], [place] [address], [place], [place] [address]    Culture   Final    NO ANAEROBES ISOLATED; CULTURE IN PROGRESS FOR 5 DAYS CULTURE REINCUBATED FOR BETTER GROWTH    Report Status PENDING  Incomplete  Culture, blood (routine x 2)     Status: None (Preliminary result)   Collection Time: 12/12/17  1:54 PM  Result Value Ref Range Status   Specimen Description   Final    BLOOD RIGHT ARM Performed at [facility] [address] [place], [place] [address]    Special Requests   Final    BOTTLES  DRAWN AEROBIC AND ANAEROBIC Blood Culture adequate volume Performed at [facility] [address] [place], [place] [address]    Culture   Final    NO GROWTH < 24 HOURS Performed at [facility] [address], [place], [place] [address]    Report Status PENDING  Incomplete  Culture, blood (routine x 2)     Status: None (Preliminary result)   Collection Time: 12/12/17  2:02 PM  Result Value Ref Range Status   Specimen Description   Final    BLOOD BLOOD RIGHT FOREARM Performed at [facility] [address] [place], [place] [address]    Special Requests   Final    BOTTLES DRAWN AEROBIC AND ANAEROBIC Blood Culture adequate volume Performed at [facility] [address] [place],  [address]    Culture  Final    NO GROWTH < 24 HOURS Performed at [facility], [place] [address], [place], [place] [address]    Report Status PENDING  Incomplete    Radiology Studies: No results found.  Scheduled Meds: . enoxaparin (LOVENOX) injection  40 mg Subcutaneous Q24H  . levothyroxine  88 mcg Oral QAC breakfast   Continuous Infusions: . cefTRIAXone (ROCEPHIN)  IV Stopped (12/13/17 1203)  . metronidazole Stopped (12/13/17 1040)    LOS: 4 days   Kerney Elbe, DO [facility] PAGER is on [place]  If 7PM-7AM, please contact night-coverage [email or web address] Password [email or web address] 12/13/2017, 5:01 PM

## 2017-12-14 ENCOUNTER — Telehealth: Payer: Self-pay | Admitting: Infectious Diseases

## 2017-12-14 LAB — CBC WITH DIFFERENTIAL/PLATELET
ABS IMMATURE GRANULOCYTES: 0.13 10*3/uL — AB (ref 0.00–0.07)
Basophils Absolute: 0 10*3/uL (ref 0.0–0.1)
Basophils Relative: 1 %
EOS ABS: 0.3 10*3/uL (ref 0.0–0.5)
Eosinophils Relative: 6 %
HEMATOCRIT: 39.5 % (ref 36.0–46.0)
Hemoglobin: 12.4 g/dL (ref 12.0–15.0)
IMMATURE GRANULOCYTES: 2 %
LYMPHS ABS: 1.7 10*3/uL (ref 0.7–4.0)
Lymphocytes Relative: 27 %
MCH: 27.7 pg (ref 26.0–34.0)
MCHC: 31.4 g/dL (ref 30.0–36.0)
MCV: 88.2 fL (ref 80.0–100.0)
MONO ABS: 0.7 10*3/uL (ref 0.1–1.0)
Monocytes Relative: 12 %
NEUTROS PCT: 52 %
Neutro Abs: 3.2 10*3/uL (ref 1.7–7.7)
Platelets: 271 10*3/uL (ref 150–400)
RBC: 4.48 MIL/uL (ref 3.87–5.11)
RDW: 13 % (ref 11.5–15.5)
WBC: 6.1 10*3/uL (ref 4.0–10.5)
nRBC: 0 % (ref 0.0–0.2)

## 2017-12-14 LAB — COMPREHENSIVE METABOLIC PANEL
ALT: 37 U/L (ref 0–44)
ANION GAP: 10 (ref 5–15)
AST: 54 U/L — AB (ref 15–41)
Albumin: 3.4 g/dL — ABNORMAL LOW (ref 3.5–5.0)
Alkaline Phosphatase: 68 U/L (ref 38–126)
BUN: 8 mg/dL (ref 6–20)
CHLORIDE: 105 mmol/L (ref 98–111)
CO2: 24 mmol/L (ref 22–32)
Calcium: 8.8 mg/dL — ABNORMAL LOW (ref 8.9–10.3)
Creatinine, Ser: 0.68 mg/dL (ref 0.44–1.00)
GFR calc Af Amer: >60 mL/min (ref >60)
GFR calc non Af Amer: >60 mL/min (ref >60)
GLUCOSE: 88 mg/dL (ref 70–99)
POTASSIUM: 3.7 mmol/L (ref 3.5–5.1)
Sodium: 139 mmol/L (ref 135–145)
Total Bilirubin: 0.8 mg/dL (ref 0.3–1.2)
Total Protein: 7.3 g/dL (ref 6.5–8.1)

## 2017-12-14 LAB — PROCALCITONIN: PROCALCITONIN: 5.75 ng/mL

## 2017-12-14 LAB — MAGNESIUM: Magnesium: 2.2 mg/dL (ref 1.7–2.4)

## 2017-12-14 LAB — PHOSPHORUS: PHOSPHORUS: 4 mg/dL (ref 2.5–4.6)

## 2017-12-14 MED ORDER — AMOXICILLIN-POT CLAVULANATE 875-125 MG PO TABS: 1.0000 | ORAL_TABLET | Freq: Two times a day (BID) | ORAL | 1 refills | Status: DC

## 2017-12-14 MED ORDER — METRONIDAZOLE 500 MG PO TABS: 500.0000 mg | ORAL_TABLET | Freq: Two times a day (BID) | ORAL | Status: DC

## 2017-12-14 MED ORDER — METRONIDAZOLE 500 MG PO TABS: 500.0000 mg | ORAL_TABLET | Freq: Two times a day (BID) | ORAL | 0 refills | Status: AC

## 2017-12-14 MED ORDER — SODIUM CHLORIDE 0.9 % IV BOLUS: 1000.0000 mL | Freq: Once | INTRAVENOUS | Status: DC

## 2017-12-14 MED ORDER — AMOXICILLIN-POT CLAVULANATE 875-125 MG PO TABS: 1.0000 | ORAL_TABLET | Freq: Two times a day (BID) | ORAL | Status: DC

## 2017-12-14 NOTE — Telephone Encounter (Signed)
Patient called on cell phone - LVM with information about upcoming appointment on:   December 03 @ 09:00 am  Callback # and clinic address was left in voicemail

## 2017-12-14 NOTE — Discharge Summary (Signed)
Physician Discharge Summary  Anne Newton YQM:[id number] DOB: 02-25-1990 DOA: 12/09/2017  PCP: Foye Spurling, MD  Admit date: 12/09/2017 Discharge date: 12/14/2017  Admitted From: Home Disposition: Home  Recommendations for Outpatient Follow-up:  1. Follow up with PCP in 1-2 weeks 2. Follow up with General Surgery Dr. Hassell Done in 4 weeks to discuss Gallbladder 3. Follow up with Infectious Diseases Dr. Tommy Medal to repeat CT Scan of Abdomen and follow up on Liver Abscess 4. Please obtain CMP/CBC, Mag, Phos in one week 5. Please follow up on the following pending results: Liver Abscess Culture; Repeat Blood Cultures   Home Health: No Equipment/Devices: None  Discharge Condition: Stable CODE STATUS: FULL CODE Diet recommendation: Regular Diet  Brief/Interim Summary: Brief Narrative:  The patient is a 27 year old female with papillary thyroid cancer status post thyroidectomy in 2014 on Synthroid replacement presented to the ED with fevers and chills since the morning of admission.   Patient reports that for the past 2 days she was having lower abdominal pain with some vaginal discharge. Reported having monogamous relationship and unprotected sexual intercourse yesterday. She denied any nausea, vomiting, diarrhea, shortness of breath, headache or chest discomfort. Denies any recent travel or sick contact. Denies any weight loss.  In the ED patient was septic with fever of 103 F, hypotensive and tachycardic with WBC of 22K and elevated lactic acid. Pelvic exam done by ED physician showed pelvic tenderness, wet prep showed clue cells. UA and chest x-ray were unremarkable. LFTs were mildly elevated. Blood cultures obtained and placed on empiric antibiotics for suspected PID. CT of the abdomen and pelvis showed small amount of pericholecystic fluid without any gallstones. Also showed a 5.3 cm masslike attenuation in the right hepatic lobe. Pelvic ultrasound/transvaginal ultrasound was  negative for any acute findings. Patient admitted to stepdown unit for sepsis work-up.  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  She also was found to have a liver abscess which was drained by IR and culture is pending for that but Gram Stain did show No WBC Seen and No Organisms seen and no Anaerobes were isoliated .  I discussed the case with infectious disease Dr. Tommy Medal who recommended repeating blood cultures which showed NGTD <24 hours. Dr. Tommy Medal recommended continuing IV ceftriaxone and metronidazole while hospitalized and recommended changing to Augmentin for 1 month with close ID follow up in Clinic for re-imaging and discharge.  Is deemed medically stable for discharge and will need to follow-up with PCP, surgery, and infectious disease in outpatient setting.  Discharge Diagnoses:  Principal Problem:   Sepsis ([facility]) Active Problems:   Hypothyroidism   Hypokalemia  Severe sepsis with septic shock([facility]), improved  -Etiology unclear possible pelvic inflammatory disease versus Hepatic mass/abscess but suspect it is Related to Gallbladder and Hepatic Abscess. -Antibiotic switched to IV Rocephin and Flagyl to cover for both her abscess and PID.  -Blood culture on admission growing Fusobacterium Necrophorum (both sets of Blood Cx's) -CT and ultrasound abdomen concerning for hepatic mass versus abscess. No right upper quadrant tenderness or gallstones noted.  -MRI abdomen done showing 2 cm liver abscess lower right hepatic lobe along with diffuse gallbladder wall edema. -Sepsis resolved but still a little hypotensive but asymptomatic  -Continued IV hydration but now stopped, Given Bolus this AM but not symptomatic  -C/w empiric antibiotics as PCT is trending down and went from 88.84 -> 19.93 -> 10.18 -> 5.75 -Patient no longer has a  Leukocytosis as WBC is 6.6 -General Surgery consulted who recommended IR consult for  aspiration of the liver lesion which was done 12/11/17. -I discussed the case with Infectious Diseases Dr. Tommy Medal who recommended repeating Blood Cx and awaiting for Cx and Sensitivities to come back from Liver Abscess -Surgery does not feel that this is acute cholecystitis and the findings on the imaging of the gallbladder inflammation are likely secondary to her liver abscess -Repeat Blood Cx's x2 show NGTD at <24 hours and Aerobic/Anaerobic Gram Stain from Abscess Aspirate shows No Organisms and No WBC's as well as no anaerobes isolated but culture has been reading computer for better growth -I discussed the case with Dr. Drucilla Schmidt again who recommends discharging the patient on Augmentin for at least a month with close ID follow-up within 4 weeks for reimaging and evaluation and also recommend writing a refill for her Augmentin for another month -We will need referral to infectious disease clinic and have repeat CT scan done within 1 month -We will need to follow-up with PCP for hospital discharge and need to have PCP follow-up on liver abscess culture results as well as finalized blood culture results  Hypothyroidism -Status post thyroidectomy for papillary thyroid cancer.  -On Levothyroxine 88 mcg po Daily  Hypokalemia -Improved and is now 3.7 -Continue to Monitor and Replete as Necessary -Repeat CMP in AM   Transaminitis -Improved AST slightly elevated now -LFT's had normalized and AST was 22 and ALT was 30 but AST slightly bumped up and was 54 -Continue to monitor repeat CMP in outpatient setting and have further work-up as indicated by PCP if consistently remains elevated  Normocytic Anemia -Patient's hemoglobin/hematocrit went from 10.7/34.5 now 12.1/38.6 -Continue monitor for signs and symptoms of bleeding -Repeat CBC in the a.m.  Bacterial Vaginosis -Clue Cells noted on Wet Prep -Patient is covered by IV Metronidazole and will change to p.o. 500 twice daily at discharge  for 3 more doses to cover completion of course -Continue IV Metronidazole for now (Day 6/7)  Discharge Instructions  Discharge Instructions    Call MD for:  difficulty breathing, headache or visual disturbances   Complete by:  As directed    Call MD for:  extreme fatigue   Complete by:  As directed    Call MD for:  hives   Complete by:  As directed    Call MD for:  persistant dizziness or light-headedness   Complete by:  As directed    Call MD for:  persistant nausea and vomiting   Complete by:  As directed    Call MD for:  redness, tenderness, or signs of infection (pain, swelling, redness, odor or green/yellow discharge around incision site)   Complete by:  As directed    Call MD for:  severe uncontrolled pain   Complete by:  As directed    Call MD for:  temperature >100.4   Complete by:  As directed    Diet - low sodium heart healthy   Complete by:  As directed    Discharge instructions   Complete by:  As directed    You were cared for by a hospitalist during your hospital stay. If you have any questions about your discharge medications or the care you received while you were in the hospital after you are discharged, you can call the unit and ask to speak with the hospitalist on call if the hospitalist that took care of you is not available. Once you are discharged, your primary  care physician will handle any further medical issues. Please note that NO REFILLS for any discharge medications will be authorized once you are discharged, as it is imperative that you return to your primary care physician (or establish a relationship with a primary care physician if you do not have one) for your aftercare needs so that they can reassess your need for medications and monitor your lab values.  Follow up with PCP, General Surgery, and Infectious Diseases. Take all medications as prescribed. If symptoms change or worsen please return to the ED for evaluation   Increase activity slowly   Complete  by:  As directed      Allergies as of 12/14/2017      Reactions   Chicken Allergy Shortness Of Breath   EXACERBATES ASTHMA   Shrimp [shellfish Allergy] Shortness Of Breath   EXACERBATES ASTHMA      Medication List    TAKE these medications   albuterol 108 (90 Base) MCG/ACT inhaler Commonly known as:  PROVENTIL HFA;VENTOLIN HFA Inhale 2 puffs into the lungs every 6 (six) hours as needed for wheezing.   amoxicillin-clavulanate 875-125 MG tablet Commonly known as:  AUGMENTIN Take 1 tablet by mouth every 12 (twelve) hours. Start taking on:  12/15/2017   FLUoxetine 20 MG tablet Commonly known as:  PROZAC Take 1 tablet (20 mg total) by mouth daily. If no improvement after 3 weeks, start taking 2 pills/daily.   levonorgestrel-ethinyl estradiol 0.1-20 MG-MCG tablet Commonly known as:  AVIANE,ALESSE,LESSINA Take 1 tablet by mouth daily.   levothyroxine 88 MCG tablet Commonly known as:  SYNTHROID, LEVOTHROID Take 88 mcg by mouth daily before breakfast.   magic mouthwash w/lidocaine Soln Take 10 mLs by mouth every 2 (two) hours as needed for mouth pain.   metroNIDAZOLE 500 MG tablet Commonly known as:  FLAGYL Take 1 tablet (500 mg total) by mouth every 12 (twelve) hours for 3 doses.      Follow-up Information    Johnathan Hausen, MD. Schedule an appointment as soon as possible for a visit in 4 week(s).   Specialty:  General Surgery Why:  to discuss gallbladder Contact information: [address] [place] [address] [phone]        Foye Spurling, MD. Call.   Specialty:  Internal Medicine Why:  Follow up within 1 week Contact information: [address] [place] [address] [phone]        Tommy Medal, Lavell Islam, MD. Call.   Specialty:  Infectious Diseases Why:  Follow up within 1 month to have evaluation for Liver Abscess and likely repeat CT Scan of the Abdomen Contact information: [address]  [id number] [phone]          Allergies  Allergen Reactions  . Chicken Allergy Shortness Of Breath    EXACERBATES ASTHMA  . Shrimp [Shellfish Allergy] Shortness Of Breath    EXACERBATES ASTHMA   Consultations:  General Surgery  IR  Discussed Case with ID  Procedures/Studies: Dg Chest 2 View  Result Date: 12/09/2017 CLINICAL DATA:  Possible sepsis EXAM: CHEST - 2 VIEW COMPARISON:  10/16/2012 FINDINGS: The heart size and mediastinal contours are within normal limits. Both lungs are clear. The visualized skeletal structures are unremarkable. IMPRESSION: No active cardiopulmonary disease. Electronically Signed   By: Donavan Foil M.D.   On: 12/09/2017 18:19   Mr Abdomen W Wo Contrast  Result Date: 12/10/2017 CLINICAL DATA:  Fevers and chills.  Evaluate for liver abscess.  EXAM: MRI ABDOMEN WITHOUT AND WITH CONTRAST TECHNIQUE: Multiplanar multisequence MR imaging of the abdomen was performed both before and after the administration of intravenous contrast. CONTRAST:  7 cc of Gadavist. COMPARISON:  12/10/2017 FINDINGS: Lower chest: Small pleural effusions. Hepatobiliary: Within the posterior right lobe of liver there is a mildly T2 hyperintense structure measuring 2 cm, image 49/802. There is mild peripheral hyper enhancement associated with this structure without enhancing internal soft tissue component. No additional focal liver abnormality identified. There is mild diffuse gallbladder wall edema, image 33/5. No gallstones identified. No biliary ductal dilatation. Pancreas: No mass, inflammatory changes, or other parenchymal abnormality identified. Spleen:  Within normal limits in size and appearance. Adrenals/Urinary Tract: Normal appearance of the adrenal glands. Arising from the upper pole of the right kidney is a mild T1 hyperintense and T2 hypointense structure without internal enhancement, image 63/802. No hydronephrosis identified bilaterally. Stomach/Bowel: Stomach and the visualized  upper abdominal bowel loops are unremarkable. Vascular/Lymphatic: No pathologically enlarged lymph nodes identified. No abdominal aortic aneurysm demonstrated. Other:  None. Musculoskeletal: No suspicious bone lesions identified. IMPRESSION: 1. Lesion within the posterior right lobe of liver is again noted with a maximum dimension of 2 cm. There is hyperemia of the surrounding soft tissues. In the acute setting findings may represent a small liver abscess. Benign or malignant primary or metastatic liver lesion is considered less favored but not excluded. Further workup with tissue sampling/needle aspiration versus follow-up imaging after appropriate antibiotic therapy is recommended to confirm benignity. 2. Lesion arising from the upper pole of the right kidney is favored to represent a hemorrhagic/proteinaceous cyst. This is compatible with a benign, Bosniak category 2 lesion. 3. Diffuse gallbladder wall edema.  Cannot rule out cholecystitis. Electronically Signed   By: Kerby Moors M.D.   On: 12/10/2017 18:15   US Abdomen Complete  Result Date: 12/10/2017 CLINICAL DATA:  Elevated liver function studies, elevated white blood cell count. EXAM: ABDOMEN ULTRASOUND COMPLETE COMPARISON:  Abdominopelvic CT scan of December 10, 2017 FINDINGS: Gallbladder: The gallbladder is adequately distended. There is irregular gallbladder wall thickening with maximal measurement of 3.8 mm. There is pericholecystic fluid. There is no positive sonographic Murphy's sign. No stones are observed. Common bile duct: Diameter: 2.4 mm Liver: In the right hepatic lobe posteriorly there is a cystic region corresponding to the focal hypo echo it hypodense region on the CT scan. It measures approximately 1.3 x 1.4 x 1.5.cm. There there is mixed echotexture surrounding the discrete cyst. Portal vein is patent on color Doppler imaging with normal direction of blood flow towards the liver. IVC: No abnormality visualized. Pancreas: Visualized  portion unremarkable. Spleen: Size and appearance within normal limits. Right Kidney: Length: 11.6 cm. Echogenicity within normal limits. No mass or hydronephrosis visualized. Left Kidney: Length: 11.7 cm. Echogenicity within normal limits. No mass or hydronephrosis visualized. Abdominal aorta: No aneurysm visualized. Other findings: None. IMPRESSION: Gallbladder wall thickening and small amount of pericholecystic fluid. No positive sonographic Murphy's sign or evidence of stones. This may reflect subacute or chronic cholecystitis. Cystic appearing structure in the right hepatic lobe which corresponds to the CT finding. However, surrounding abnormal appearing parenchyma on the CT scan is evident on the ultrasound where there is a mixed echogenicity pattern. Neoplasm or hepatic abscess could be present and present in this manner. Again, MRI of the liver is recommended. No discrete ultrasonic abnormality in the upper pole of the right kidney. An area of subtle decreased density in the upper pole collecting system suggests  a prominent calyx but is nonspecific. Again this too could be addressed on the above-mentioned MRI. Electronically Signed   By: David  Martinique M.D.   On: 12/10/2017 07:17   US Transvaginal Non-ob  Result Date: 12/09/2017 CLINICAL DATA:  Pelvic pain EXAM: TRANSABDOMINAL AND TRANSVAGINAL ULTRASOUND OF PELVIS DOPPLER ULTRASOUND OF OVARIES TECHNIQUE: Both transabdominal and transvaginal ultrasound examinations of the pelvis were performed. Transabdominal technique was performed for global imaging of the pelvis including uterus, ovaries, adnexal regions, and pelvic cul-de-sac. It was necessary to proceed with endovaginal exam following the transabdominal exam to visualize the endometrium and ovaries. Color and duplex Doppler ultrasound was utilized to evaluate blood flow to the ovaries. COMPARISON:  None. FINDINGS: Uterus Measurements: 8.5 x 4.3 x 5.0 cm = volume: 96 mL. No fibroids or other mass  visualized. Endometrium Thickness: 9.  No focal abnormality visualized. Right ovary Measurements: 2.9 x 1.8 x 3.3 cm = volume: 9 mL. Normal appearance/no adnexal mass. Left ovary Measurements: 3.7 x 2.7 x 3.2 cm = volume: 16.7 mL. Normal appearance/no adnexal mass. Pulsed Doppler evaluation of both ovaries demonstrates normal low-resistance arterial and venous waveforms. Other findings Trace free fluid noted in the cul-de-sac. IMPRESSION: Unremarkable study. No findings to explain the patient's history of pain. Electronically Signed   By: Misty Stanley M.D.   On: 12/09/2017 20:04   US Pelvis Complete  Result Date: 12/09/2017 CLINICAL DATA:  Pelvic pain EXAM: TRANSABDOMINAL AND TRANSVAGINAL ULTRASOUND OF PELVIS DOPPLER ULTRASOUND OF OVARIES TECHNIQUE: Both transabdominal and transvaginal ultrasound examinations of the pelvis were performed. Transabdominal technique was performed for global imaging of the pelvis including uterus, ovaries, adnexal regions, and pelvic cul-de-sac. It was necessary to proceed with endovaginal exam following the transabdominal exam to visualize the endometrium and ovaries. Color and duplex Doppler ultrasound was utilized to evaluate blood flow to the ovaries. COMPARISON:  None. FINDINGS: Uterus Measurements: 8.5 x 4.3 x 5.0 cm = volume: 96 mL. No fibroids or other mass visualized. Endometrium Thickness: 9.  No focal abnormality visualized. Right ovary Measurements: 2.9 x 1.8 x 3.3 cm = volume: 9 mL. Normal appearance/no adnexal mass. Left ovary Measurements: 3.7 x 2.7 x 3.2 cm = volume: 16.7 mL. Normal appearance/no adnexal mass. Pulsed Doppler evaluation of both ovaries demonstrates normal low-resistance arterial and venous waveforms. Other findings Trace free fluid noted in the cul-de-sac. IMPRESSION: Unremarkable study. No findings to explain the patient's history of pain. Electronically Signed   By: Misty Stanley M.D.   On: 12/09/2017 20:04   Ct Abdomen Pelvis W  Contrast  Result Date: 12/10/2017 CLINICAL DATA:  Fever for couple of days. Lower abdominal pain. Vaginal pain. EXAM: CT ABDOMEN AND PELVIS WITH CONTRAST TECHNIQUE: Multidetector CT imaging of the abdomen and pelvis was performed using the standard protocol following bolus administration of intravenous contrast. CONTRAST:  148mL OMNIPAQUE IOHEXOL 300 MG/ML  SOLN COMPARISON:  None. FINDINGS: Lower chest: No acute abnormality. Hepatobiliary: Ill-defined region of mixed attenuation, primarily low-attenuation, in the right hepatic lobe. This abnormality measures at least 5.3 cm as seen on coronal image 54. Along the superior edge of this abnormality is a more discrete rounded low-attenuation mass measuring 16 mm on coronal image 49. Whether this more discrete masses part of the broader process are separate is unclear. Focal fatty deposition adjacent to the falciform ligament. No other liver abnormalities are noted. The portal vein is patent. Pericholecystic fluid is identified without an obvious stone. Pancreas: Unremarkable. No pancreatic ductal dilatation or surrounding inflammatory changes. Spleen: Normal  in size without focal abnormality. Adrenals/Urinary Tract: A 15 mm low-attenuation mass in the superior right kidney demonstrates an attenuation of 60 Hounsfield units. No other renal masses. No perinephric stranding or stones. No ureterectasis or ureteral stones. The bladder is unremarkable. The adrenal glands are normal. Stomach/Bowel: The stomach and small bowel are normal. The colon and appendix are normal. Vascular/Lymphatic: No significant vascular findings are present. No enlarged abdominal or pelvic lymph nodes. Reproductive: Uterus and bilateral adnexa are unremarkable. Other: There is a small amount of free fluid in the pelvis, likely physiologic. No free air. Musculoskeletal: No acute or significant osseous findings. IMPRESSION: 1. There is a small amount of peri cholecystic fluid. This raises the  possibility of acute cholecystitis. However, no stones are seen. Recommend right upper quadrant ultrasound. 2. 5.3 cm masslike region of mixed attenuation in the right hepatic lobe. There is an adjacent 16 mm low-attenuation mass which could be part of the broader process or a separate mass. The findings are nonspecific but are suspicious for a neoplasm or neoplasms such as an adenoma. Recommended MRI for further evaluation. I doubt the hepatic findings are associated with the patient's acute symptoms and the MRI may be performed as an outpatient depending on the appearance of the hepatic mass on the recommended ultrasound. 3. 15 mm low-attenuation nonspecific lesion in the right kidney. Recommend attention to this mass on the recommended MRI. 4. Small amount of free fluid in the pelvis is likely physiologic. Electronically Signed   By: Dorise Bullion III M.D   On: 12/10/2017 01:14   [place] Art/ven Flow Abd Pelv Doppler  Result Date: 12/09/2017 CLINICAL DATA:  Pelvic pain EXAM: TRANSABDOMINAL AND TRANSVAGINAL ULTRASOUND OF PELVIS DOPPLER ULTRASOUND OF OVARIES TECHNIQUE: Both transabdominal and transvaginal ultrasound examinations of the pelvis were performed. Transabdominal technique was performed for global imaging of the pelvis including uterus, ovaries, adnexal regions, and pelvic cul-de-sac. It was necessary to proceed with endovaginal exam following the transabdominal exam to visualize the endometrium and ovaries. Color and duplex Doppler ultrasound was utilized to evaluate blood flow to the ovaries. COMPARISON:  None. FINDINGS: Uterus Measurements: 8.5 x 4.3 x 5.0 cm = volume: 96 mL. No fibroids or other mass visualized. Endometrium Thickness: 9.  No focal abnormality visualized. Right ovary Measurements: 2.9 x 1.8 x 3.3 cm = volume: 9 mL. Normal appearance/no adnexal mass. Left ovary Measurements: 3.7 x 2.7 x 3.2 cm = volume: 16.7 mL. Normal appearance/no adnexal mass. Pulsed Doppler evaluation of both  ovaries demonstrates normal low-resistance arterial and venous waveforms. Other findings Trace free fluid noted in the cul-de-sac. IMPRESSION: Unremarkable study. No findings to explain the patient's history of pain. Electronically Signed   By: Misty Stanley M.D.   On: 12/09/2017 20:04    Subjective: Seen and examined at bedside and was doing well.  Denying chest pain, lightheadedness or dizziness.  No abdominal pain.  States she is back to baseline.  No other concerns or complaints at this time is ready to go home.  Discharge Exam: Vitals:   12/13/17 2017 12/14/17 0625  BP: 97/73 (!) 85/66  Pulse: 70 74  Resp: 19 18  Temp: 98.5 F (36.9 C) 98.4 F (36.9 C)  SpO2: 99% 95%   Vitals:   12/13/17 0417 12/13/17 1443 12/13/17 2017 12/14/17 0625  BP: 104/63 103/71 97/73 (!) 85/66  Pulse: 76 71 70 74  Resp: 18 16 19 18   Temp: 98.4 F (36.9 C) 98.2 F (36.8 C) 98.5 F (36.9 C) 98.4  F (36.9 C)  TempSrc: Oral  Oral Oral  SpO2: 99% 98% 99% 95%  Weight:      Height:       General: Pt is alert, awake, not in acute distress Cardiovascular: RRR, S1/S2 +, no rubs, no gallops Respiratory: CTA bilaterally, no wheezing, no rhonchi Abdominal: Soft, NT, ND, bowel sounds + Extremities: no edema, no cyanosis  The results of significant diagnostics from this hospitalization (including imaging, microbiology, ancillary and laboratory) are listed below for reference.    Microbiology: Recent Results (from the past 240 hour(s))  Culture, blood (Routine x 2)     Status: Abnormal   Collection Time: 12/09/17  4:49 PM  Result Value Ref Range Status   Specimen Description   Final    LEFT ANTECUBITAL Performed at [facility] [address] [place], [place] [address]    Special Requests   Final    BOTTLES DRAWN AEROBIC AND ANAEROBIC Blood Culture results may not be optimal due to an excessive volume of blood received in culture bottles Performed at [facility] [address], [place], [place] [address]    Culture  Setup Time   Final    GRAM NEGATIVE RODS ANAEROBIC BOTTLE ONLY CRITICAL RESULT CALLED TO, READ BACK BY AND VERIFIED WITH: Colin Rhein PHARMD [id number] 12/11/17 A BROWNING    Culture (A)  Final    FUSOBACTERIUM NECROPHORUM BETA LACTAMASE POSITIVE Performed at [facility], [place] [address] [place], [place] [address]    Report Status 12/12/2017 FINAL  Final  Group A Strep by PCR     Status: None   Collection Time: 12/09/17  4:58 PM  Result Value Ref Range Status   Group A Strep by PCR NOT DETECTED NOT DETECTED Final    Comment: Performed at [facility], [place] [address], [place], [place] [address]  Culture, blood (Routine x 2)     Status: Abnormal   Collection Time: 12/09/17  5:07 PM  Result Value Ref Range Status   Specimen Description   Final    RIGHT ANTECUBITAL Performed at [facility] [address] [place], [place] [address]    Special Requests   Final    BOTTLES DRAWN AEROBIC AND ANAEROBIC Blood Culture adequate volume   Culture  Setup Time   Final    ANAEROBIC BOTTLE ONLY GRAM NEGATIVE RODS CRITICAL RESULT CALLED TO, READ BACK BY AND VERIFIED WITH: Colin Rhein PHARMD 0104 12/11/17 A BROWNING    Culture (A)  Final    FUSOBACTERIUM NECROPHORUM BETA LACTAMASE POSITIVE Performed at [facility], [place] [address] [place], [place] [address]    Report Status 12/12/2017 FINAL  Final  Blood Culture ID Panel (Reflexed)     Status: None   Collection Time: 12/09/17  5:07 PM  Result Value Ref Range Status   Enterococcus species NOT DETECTED NOT DETECTED Final   Listeria monocytogenes NOT DETECTED NOT DETECTED Final   Staphylococcus species NOT DETECTED NOT DETECTED Final   Staphylococcus aureus (BCID) NOT DETECTED NOT DETECTED Final   Streptococcus species NOT DETECTED NOT DETECTED Final   Streptococcus agalactiae NOT DETECTED NOT DETECTED Final   Streptococcus pneumoniae NOT DETECTED  NOT DETECTED Final   Streptococcus pyogenes NOT DETECTED NOT DETECTED Final   Acinetobacter baumannii NOT DETECTED NOT DETECTED Final   Enterobacteriaceae species NOT DETECTED NOT DETECTED Final   Enterobacter cloacae complex NOT DETECTED NOT DETECTED Final   Escherichia coli NOT DETECTED  NOT DETECTED Final   Klebsiella oxytoca NOT DETECTED NOT DETECTED Final   Klebsiella pneumoniae NOT DETECTED NOT DETECTED Final   Proteus species NOT DETECTED NOT DETECTED Final   Serratia marcescens NOT DETECTED NOT DETECTED Final   Haemophilus influenzae NOT DETECTED NOT DETECTED Final   Neisseria meningitidis NOT DETECTED NOT DETECTED Final   Pseudomonas aeruginosa NOT DETECTED NOT DETECTED Final   Candida albicans NOT DETECTED NOT DETECTED Final   Candida glabrata NOT DETECTED NOT DETECTED Final   Candida krusei NOT DETECTED NOT DETECTED Final   Candida parapsilosis NOT DETECTED NOT DETECTED Final   Candida tropicalis NOT DETECTED NOT DETECTED Final    Comment: Performed at [facility], [place] [address], [place], [place] [address]  Wet prep, genital     Status: Abnormal   Collection Time: 12/09/17  5:11 PM  Result Value Ref Range Status   Yeast Wet Prep HPF POC FEW (A) NONE SEEN Final   Trich, Wet Prep NONE SEEN NONE SEEN Final   Clue Cells Wet Prep HPF POC PRESENT (A) NONE SEEN Final   WBC, Wet Prep HPF POC MANY (A) NONE SEEN Final   Sperm NONE SEEN  Final    Comment: Performed at [facility], [place] [address], [place], [place] [address]  MRSA PCR Screening     Status: None   Collection Time: 12/10/17 12:12 AM  Result Value Ref Range Status   MRSA by PCR NEGATIVE NEGATIVE Final    Comment:        The GeneXpert MRSA Assay (FDA approved for NASAL specimens only), is one component of a comprehensive MRSA colonization surveillance program. It is not intended to diagnose MRSA infection nor to guide or monitor treatment for MRSA infections. Performed at [facility], [place] [address] [place], [place] [address]   Aerobic/Anaerobic Culture (surgical/deep wound)     Status: None (Preliminary result)   Collection Time: 12/11/17  4:01 PM  Result Value Ref Range Status   Specimen Description   Final    LIVER Performed at [facility], [place] [address] [place], [place] [address]    Special Requests   Final    Normal Performed at [facility], [place] [address], [place], [place] [address]    Gram Stain   Final    NO WBC SEEN NO ORGANISMS SEEN Performed at [facility], [place] [address], [place], [place] [address]    Culture   Final    NO ANAEROBES ISOLATED; CULTURE IN PROGRESS FOR 5 DAYS CULTURE REINCUBATED FOR BETTER GROWTH    Report Status PENDING  Incomplete  Culture, blood (routine x 2)     Status: None (Preliminary result)   Collection Time: 12/12/17  1:54 PM  Result Value Ref Range Status   Specimen Description   Final    BLOOD RIGHT ARM Performed at [place] [address], [place], [place] [address]    Special Requests   Final    BOTTLES DRAWN AEROBIC AND ANAEROBIC Blood Culture adequate volume Performed at [place] [address] [place],  [address]    Culture   Final    NO GROWTH < 24 HOURS Performed at [place] [address] [place],  [address]    Report Status PENDING  Incomplete  Culture, blood (routine x 2)     Status: None (Preliminary result)   Collection Time: 12/12/17  2:02 PM  Result Value Ref Range Status   Specimen Description   Final    BLOOD BLOOD RIGHT FOREARM Performed at [place] [address] [place], [place] [address]    Special Requests   Final    BOTTLES DRAWN AEROBIC AND ANAEROBIC Blood Culture adequate volume Performed at [place] [address], [place], [place] [address]    Culture   Final    NO GROWTH < 24  HOURS Performed at [facility] [address], [place], [place] [address]    Report Status PENDING  Incomplete    Labs: BNP (last 3 results) No results for input(s): BNP in the last 8760 hours. Basic Metabolic Panel: Recent Labs  Lab 12/10/17 0305 12/11/17 0235 12/12/17 0623 12/13/17 0606 12/14/17 0610  NA 141 140 137 138 139  K 3.7 3.2* 3.6 3.5 3.7  CL 116* 113* 106 104 105  CO2 19* 19* 23 23 24   GLUCOSE 126* 103* 85 86 88  BUN 11 5* 5* 5* 8  CREATININE 0.65 0.50 0.66 0.53 0.68  CALCIUM 7.3* 7.8* 8.6* 8.7* 8.8*  MG  --   --   --  2.0 2.2  PHOS  --   --   --  3.9 4.0   Liver Function Tests: Recent Labs  Lab 12/10/17 0305 12/11/17 0235 12/12/17 0823 12/13/17 0606 12/14/17 0610  AST 80* 36 21 22 54*  ALT 66* 49* 37 30 37  ALKPHOS 74 66 62 64 68  BILITOT 1.5* 1.3* 0.9 0.7 0.8  PROT 5.8* 6.0* 6.6 7.0 7.3  ALBUMIN 2.8* 3.1* 3.1* 3.3* 3.4*   No results for input(s): LIPASE, AMYLASE in the last 168 hours. No results for input(s): AMMONIA in the last 168 hours. CBC: Recent Labs  Lab 12/09/17 1649  12/10/17 0305 12/11/17 0235 12/12/17 0623 12/13/17 0606 12/14/17 0610  WBC 8.6   < > 19.8* 13.1* 8.6 6.6 6.1  NEUTROABS 8.0*  --  18.4*  --   --  4.1 3.2  HGB 13.8   < > 10.9* 10.7* 11.5* 12.1 12.4  HCT 45.2   < > 35.3* 34.5* 36.2 38.6 39.5  MCV 88.8   < > 92.4 88.9 89.2 89.1 88.2  PLT 280   < > 188 162 186 215 271   < > = values in this interval not displayed.   Cardiac Enzymes: No results for input(s): CKTOTAL, CKMB, CKMBINDEX, TROPONINI in the last 168 hours. BNP: Invalid input(s): POCBNP CBG: No results for input(s): GLUCAP in the last 168 hours. D-Dimer No results for input(s): DDIMER in the last 72 hours. Hgb A1c No results for input(s): HGBA1C in the last 72 hours. Lipid Profile No results for input(s): CHOL, HDL, LDLCALC, TRIG, CHOLHDL, LDLDIRECT in the last 72 hours. Thyroid function studies No results for input(s): TSH, T4TOTAL, T3FREE,  THYROIDAB in the last 72 hours.  Invalid input(s): FREET3 Anemia work up No results for input(s): VITAMINB12, FOLATE, FERRITIN, TIBC, IRON, RETICCTPCT in the last 72 hours. Urinalysis    Component Value Date/Time   COLORURINE YELLOW 12/09/2017 2100   APPEARANCEUR CLEAR 12/09/2017 2100   LABSPEC 1.005 12/09/2017 2100   PHURINE 6.0 12/09/2017 2100   GLUCOSEU NEGATIVE 12/09/2017 2100   HGBUR NEGATIVE 12/09/2017 2100   [place] NEGATIVE 12/09/2017 2100   BILIRUBINUR neg 09/28/2014 1257   [place] 12/09/2017 2100   PROTEINUR NEGATIVE 12/09/2017 2100   UROBILINOGEN 0.2 09/28/2014 1257   NITRITE NEGATIVE 12/09/2017 2100   LEUKOCYTESUR  NEGATIVE 12/09/2017 2100   Sepsis Labs Invalid input(s): PROCALCITONIN,  WBC,  LACTICIDVEN Microbiology Recent Results (from the past 240 hour(s))  Culture, blood (Routine x 2)     Status: Abnormal   Collection Time: 12/09/17  4:49 PM  Result Value Ref Range Status   Specimen Description   Final    LEFT ANTECUBITAL Performed at [place] [address], [place], [place] [address]    Special Requests   Final    BOTTLES DRAWN AEROBIC AND ANAEROBIC Blood Culture results may not be optimal due to an excessive volume of blood received in culture bottles Performed at [facility] [address], [place], [place] [address]    Culture  Setup Time   Final    GRAM NEGATIVE RODS ANAEROBIC BOTTLE ONLY CRITICAL RESULT CALLED TO, READ BACK BY AND VERIFIED WITH: Colin Rhein PHARMD 4132 12/11/17 A BROWNING    Culture (A)  Final    FUSOBACTERIUM NECROPHORUM BETA LACTAMASE POSITIVE Performed at [facility], [place] [address], [place], [place] [address]    Report Status 12/12/2017 FINAL  Final  Group A Strep by PCR     Status: None   Collection Time: 12/09/17  4:58 PM  Result Value Ref Range Status   Group A Strep by PCR NOT DETECTED NOT DETECTED Final    Comment: Performed at [facility], [place] [address], [place], [place] [address]  Culture, blood (Routine x 2)     Status: Abnormal   Collection Time: 12/09/17  5:07 PM  Result Value Ref Range Status   Specimen Description   Final    RIGHT ANTECUBITAL Performed at [place] [address] [place], [place] [address]    Special Requests   Final    BOTTLES DRAWN AEROBIC AND ANAEROBIC Blood Culture adequate volume   Culture  Setup Time   Final    ANAEROBIC BOTTLE ONLY GRAM NEGATIVE RODS CRITICAL RESULT CALLED TO, READ BACK BY AND VERIFIED WITH: Colin Rhein PHARMD 0104 12/11/17 A BROWNING    Culture (A)  Final    FUSOBACTERIUM NECROPHORUM BETA LACTAMASE POSITIVE Performed at [facility], [place] [address] [place], [place] [address]    Report Status 12/12/2017 FINAL  Final  Blood Culture ID Panel (Reflexed)     Status: None   Collection Time: 12/09/17  5:07 PM  Result Value Ref Range Status   Enterococcus species NOT DETECTED NOT DETECTED Final   Listeria monocytogenes NOT DETECTED NOT DETECTED Final   Staphylococcus species NOT DETECTED NOT DETECTED Final   Staphylococcus aureus (BCID) NOT DETECTED NOT DETECTED Final   Streptococcus species NOT DETECTED NOT DETECTED Final   Streptococcus agalactiae NOT DETECTED NOT DETECTED Final   Streptococcus pneumoniae NOT DETECTED NOT DETECTED Final   Streptococcus pyogenes NOT DETECTED NOT DETECTED Final   Acinetobacter baumannii NOT DETECTED NOT DETECTED Final   Enterobacteriaceae species NOT DETECTED NOT DETECTED Final   Enterobacter cloacae complex NOT DETECTED NOT DETECTED Final   Escherichia coli NOT DETECTED NOT DETECTED Final   Klebsiella oxytoca NOT DETECTED NOT DETECTED Final   Klebsiella pneumoniae NOT DETECTED NOT DETECTED Final   Proteus species NOT DETECTED NOT DETECTED Final   Serratia marcescens NOT DETECTED NOT DETECTED Final   Haemophilus influenzae NOT DETECTED NOT DETECTED Final   Neisseria meningitidis NOT DETECTED NOT  DETECTED Final   Pseudomonas aeruginosa NOT DETECTED NOT DETECTED Final   Candida albicans NOT DETECTED NOT DETECTED  Final   Candida glabrata NOT DETECTED NOT DETECTED Final   Candida krusei NOT DETECTED NOT DETECTED Final   Candida parapsilosis NOT DETECTED NOT DETECTED Final   Candida tropicalis NOT DETECTED NOT DETECTED Final    Comment: Performed at [facility], [place] [address] [place], [place] [address]  Wet prep, genital     Status: Abnormal   Collection Time: 12/09/17  5:11 PM  Result Value Ref Range Status   Yeast Wet Prep HPF POC FEW (A) NONE SEEN Final   Trich, Wet Prep NONE SEEN NONE SEEN Final   Clue Cells Wet Prep HPF POC PRESENT (A) NONE SEEN Final   WBC, Wet Prep HPF POC MANY (A) NONE SEEN Final   Sperm NONE SEEN  Final    Comment: Performed at [facility], [place] [address] [place], [place] [address]  MRSA PCR Screening     Status: None   Collection Time: 12/10/17 12:12 AM  Result Value Ref Range Status   MRSA by PCR NEGATIVE NEGATIVE Final    Comment:        The GeneXpert MRSA Assay (FDA approved for NASAL specimens only), is one component of a comprehensive MRSA colonization surveillance program. It is not intended to diagnose MRSA infection nor to guide or monitor treatment for MRSA infections. Performed at [facility], [place] [address] [place], [place] [address]   Aerobic/Anaerobic Culture (surgical/deep wound)     Status: None (Preliminary result)   Collection Time: 12/11/17  4:01 PM  Result Value Ref Range Status   Specimen Description   Final    LIVER Performed at [facility], [place] [address] [place], [place] [address]    Special Requests   Final    Normal Performed at [facility], [place] [address] [place], [place] [address]    Gram Stain   Final    NO WBC SEEN NO ORGANISMS SEEN Performed at [facility], [place] [address] [place], [place] [address]    Culture    Final    NO ANAEROBES ISOLATED; CULTURE IN PROGRESS FOR 5 DAYS CULTURE REINCUBATED FOR BETTER GROWTH    Report Status PENDING  Incomplete  Culture, blood (routine x 2)     Status: None (Preliminary result)   Collection Time: 12/12/17  1:54 PM  Result Value Ref Range Status   Specimen Description   Final    BLOOD RIGHT ARM Performed at [place] [address] [place], [place] [address]    Special Requests   Final    BOTTLES DRAWN AEROBIC AND ANAEROBIC Blood Culture adequate volume Performed at [place] [address], [place], [place] [address]    Culture   Final    NO GROWTH < 24 HOURS Performed at [place] [address] [place], [place] [address]    Report Status PENDING  Incomplete  Culture, blood (routine x 2)     Status: None (Preliminary result)   Collection Time: 12/12/17  2:02 PM  Result Value Ref Range Status   Specimen Description   Final    BLOOD BLOOD RIGHT FOREARM Performed at [place] [address], [place], [place] [address]    Special Requests   Final    BOTTLES DRAWN AEROBIC AND ANAEROBIC Blood Culture adequate volume Performed at [place] [address], [place],  [address]    Culture   Final  NO GROWTH < 24 HOURS Performed at [place] [address] [place], [place] [address]    Report Status PENDING  Incomplete   Time coordinating discharge: 35 minutes  SIGNED:  Kerney Elbe, DO [facility] 12/14/2017, 11:07 AM Pager is on [place]  If 7PM-7AM, please contact night-coverage [email or web address] Password [email or web address]

## 2017-12-14 NOTE — Plan of Care (Signed)
  Problem: Safety: Goal: Ability to remain free from injury will improve Outcome: Progressing   

## 2017-12-14 NOTE — Progress Notes (Signed)
12/14/17  1230  Reviewed discharge instructions with patient. Patient verbalized understanding of discharge instructions. Copy of discharge instructions given to patient.

## 2017-12-16 LAB — AEROBIC/ANAEROBIC CULTURE (SURGICAL/DEEP WOUND)

## 2017-12-16 LAB — AEROBIC/ANAEROBIC CULTURE W GRAM STAIN (SURGICAL/DEEP WOUND)
Gram Stain: NONE SEEN
Special Requests: NORMAL

## 2017-12-17 LAB — CULTURE, BLOOD (ROUTINE X 2)
CULTURE: NO GROWTH
CULTURE: NO GROWTH
Special Requests: ADEQUATE
Special Requests: ADEQUATE

## 2017-12-18 ENCOUNTER — Ambulatory Visit: Payer: Self-pay | Admitting: Physician Assistant

## 2017-12-25 ENCOUNTER — Ambulatory Visit: Payer: Medicaid Other | Admitting: Infectious Disease

## 2017-12-28 ENCOUNTER — Ambulatory Visit: Payer: Self-pay | Admitting: Family Medicine

## 2017-12-28 VITALS — BP 94/66 | HR 66 | Temp 98.1°F | Resp 16 | Ht 61.0 in | Wt 115.0 lb

## 2017-12-28 DIAGNOSIS — E890 Postprocedural hypothyroidism: Secondary | ICD-10-CM

## 2017-12-28 DIAGNOSIS — R7989 Other specified abnormal findings of blood chemistry: Secondary | ICD-10-CM

## 2017-12-28 DIAGNOSIS — E876 Hypokalemia: Secondary | ICD-10-CM

## 2017-12-28 DIAGNOSIS — R945 Abnormal results of liver function studies: Secondary | ICD-10-CM

## 2017-12-28 DIAGNOSIS — A419 Sepsis, unspecified organism: Secondary | ICD-10-CM

## 2017-12-28 MED ORDER — LEVOTHYROXINE SODIUM 88 MCG PO TABS: 88.0000 ug | ORAL_TABLET | Freq: Every day | ORAL | 1 refills | Status: AC

## 2017-12-28 NOTE — Patient Instructions (Addendum)
  Please call infectious disease doctor to reschedule appointment. Continue antibiotic same dose for now. If any fevers, or new symptoms, be seen right away. I will recheck some blood tests.   Return to the clinic or go to the nearest emergency room if any of your symptoms worsen or new symptoms occur.  Please schedule follow-up with new primary care provider here in the next 6 months.  Thank you for coming in today.  [facility]  Infectious disease physician in [place], [place]  Address: [place], [place], [place] [address] Phone: [phone]  If you have lab work done today you will be contacted with your lab results within the next 2 weeks.  If you have not heard from [place] then please contact us. The fastest way to get your results is to register for My Chart.   IF you received an x-ray today, you will receive an invoice from [facility]. Please contact [facility] at [phone] with questions or concerns regarding your invoice.   IF you received labwork today, you will receive an invoice from [facility]. Please contact [facility] at [phone] with questions or concerns regarding your invoice.   Our billing staff will not be able to assist you with questions regarding bills from these companies.  You will be contacted with the lab results as soon as they are available. The fastest way to get your results is to activate your My Chart account. Instructions are located on the last page of this paperwork. If you have not heard from [place] regarding the results in 2 weeks, please contact this office.

## 2017-12-28 NOTE — Progress Notes (Signed)
Subjective:    Patient ID: Anne Newton, female    DOB: Sep 01, 1990, 27 y.o.   MRN: [id number]  HPI Anne Newton is a 27 y.o. female Presents today for: Chief Complaint  Patient presents with  . Medication Refill    levothyoxine/ check levels   Hypothyroidism: Takes Synthroid 88 mcg daily.  History of papillary thyroid cancer status post thyroidectomy in 2014.  No missed doses. No new hair/skin changes.  Lab Results  Component Value Date   TSH 0.534 06/21/2017    Hospital follow-up: Admitted November 17 through number 22nd with sepsis, etiology unclear, possible PID versus hepatic mass/abscess, but suspected related to gallbladder or hepatic abscess.  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  Treated with Rocephin and Flagyl.  CT and abdominal ultrasound was apparently concerning for hepatic mass versus abscess.  Repeat blood cultures cleared.  Infectious disease was consulted, discharged with 4 weeks of Augmentin with ID follow-up.  Plan for repeat CT in 1 month.  Plan for outpatient monitoring of LFTs as it had normalized then slight bump in AST.  Also had a hypokalemia and hospital but improved at 3.7.  Additionally she was treated for bacterial vaginosis with IV metronidazole then 500 mg twice daily for 3 days.   Still taking augmentin BID. No new side effects. Feeling good. No new abd pain, no fevers. Eating and drinking ok. Planned appt Dec 3rd with ID.    Lab Results  Component Value Date   ALT 37 12/14/2017   AST 54 (H) 12/14/2017   ALKPHOS 68 12/14/2017   BILITOT 0.8 12/14/2017      Patient Active Problem List   Diagnosis Date Noted  . Hypokalemia   . Sepsis ([place]) 12/09/2017  . Hepatitis B immune 12/15/2014  . Thyroid cancer ([place]) 12/16/2013  . Hypothyroidism 12/16/2013  . Unspecified asthma(493.90) 07/03/2012  . Enlargement of lymph nodes 07/03/2012   Past Medical History:  Diagnosis Date  . Asthma    prn inhaler  . Hypothyroidism   . Nasal  septal deviation 05/2017  . Nasal turbinate hypertrophy 05/2017   Past Surgical History:  Procedure Laterality Date  . SEPTOPLASTY N/A 06/30/2014   Procedure: SEPTOPLASTY;  Surgeon: Rozetta Nunnery, MD;  Location: [facility];  Service: ENT;  Laterality: N/A;  . SEPTOPLASTY N/A 06/26/2017   Procedure: SEPTOPLASTY;  Surgeon: Rozetta Nunnery, MD;  Location: [facility];  Service: ENT;  Laterality: N/A;  . THYROIDECTOMY Left 08/28/2012   Procedure: LEFT THYROID LOBECTOMY;  Surgeon: Rozetta Nunnery, MD;  Location: [facility];  Service: ENT;  Laterality: Left;  . THYROIDECTOMY Right 10/21/2012   Procedure: COMPLETION THYROIDECTOMY (RIGHT THYROID LOBECTOMY);  Surgeon: Rozetta Nunnery, MD;  Location: [facility];  Service: ENT;  Laterality: Right;  . TURBINATE REDUCTION N/A 06/30/2014   Procedure: Albin Felling REDUCTION;  Surgeon: Rozetta Nunnery, MD;  Location: [facility];  Service: ENT;  Laterality: N/A;  . TURBINATE REDUCTION Bilateral 06/26/2017   Procedure: BILATERAL TURBINATE REDUCTION;  Surgeon: Rozetta Nunnery, MD;  Location: [facility];  Service: ENT;  Laterality: Bilateral;   Allergies  Allergen Reactions  . Chicken Allergy Shortness Of Breath    EXACERBATES ASTHMA  . Shrimp [Shellfish Allergy] Shortness Of Breath    EXACERBATES ASTHMA   Prior to Admission medications   Medication Sig Start Date End Date Taking? Authorizing Provider  levothyroxine (SYNTHROID, LEVOTHROID) 88 MCG tablet Take 88 mcg by mouth daily before  breakfast.    [provider]   Social History   Socioeconomic History  . Marital status: Married    Spouse name: Not on file  . Number of children: Not on file  . Years of education: Not on file  . Highest education level: Not on file  Occupational History  . Not on file  Social Needs  . Financial resource strain: Not on file  . Food insecurity:    Worry: Not on file    Inability:  Not on file  . Transportation needs:    Medical: Not on file    Non-medical: Not on file  Tobacco Use  . Smoking status: Never Smoker  . Smokeless tobacco: Never Used  Substance and Sexual Activity  . Alcohol use: No  . Drug use: No  . Sexual activity: Yes    Birth control/protection: None  Lifestyle  . Physical activity:    Days per week: Not on file    Minutes per session: Not on file  . Stress: Not on file  Relationships  . Social connections:    Talks on phone: Not on file    Gets together: Not on file    Attends religious service: Not on file    Active member of club or organization: Not on file    Attends meetings of clubs or organizations: Not on file    Relationship status: Not on file  . Intimate partner violence:    Fear of current or ex partner: Not on file    Emotionally abused: Not on file    Physically abused: Not on file    Forced sexual activity: Not on file  Other Topics Concern  . Not on file  Social History Narrative  . Not on file    Review of Systems Per HPI.     Objective:   Physical Exam  Constitutional: She is oriented to person, place, and time. She appears well-developed and well-nourished.  HENT:  Head: Normocephalic and atraumatic.  Eyes: Pupils are equal, round, and reactive to light. Conjunctivae and EOM are normal.  Neck: Neck supple. Carotid bruit is not present. No thyromegaly present.  Cardiovascular: Normal rate, regular rhythm, normal heart sounds and intact distal pulses.  Pulmonary/Chest: Effort normal and breath sounds normal.  Abdominal: Soft. Bowel sounds are normal. She exhibits no pulsatile midline mass. There is no tenderness.  Neurological: She is alert and oriented to person, place, and time.  Skin: Skin is warm and dry.  Psychiatric: She has a normal mood and affect. Her behavior is normal.  Vitals reviewed.  Vitals:   12/28/17 0821  BP: 94/66  Pulse: 66  Resp: 16  Temp: 98.1 F (36.7 C)  TempSrc: Oral  SpO2:  96%  Weight: 115 lb (52.2 kg)  Height: 5\' 1"  (1.549 m)       Assessment & Plan:   Anne Newton is a 27 y.o. female Sepsis, due to unspecified organism, unspecified whether acute organ dysfunction present ([place]) - Plan: CBC Elevated LFTs - Plan: Comprehensive metabolic panel Hypokalemia  -Afebrile, tolerate antibiotics.  Recommended to reschedule appointment with infectious disease, and can discuss repeat imaging timing at that visit.  Prior elevated LFTs will be rechecked as well as repeat potassium.  RTC precautions if acute or worsening symptoms.  Postablative hypothyroidism - Plan: TSH, levothyroxine (SYNTHROID, LEVOTHROID) 88 MCG tablet  -Check TSH, continue Synthroid same dose.  Recommend establishing care with new primary care provider with repeat visit within the next 6  months  Meds ordered this encounter  Medications  . levothyroxine (SYNTHROID, LEVOTHROID) 88 MCG tablet    Sig: Take 1 tablet (88 mcg total) by mouth daily before breakfast.    Dispense:  90 tablet    Refill:  1   Patient Instructions    Please call infectious disease doctor to reschedule appointment. Continue antibiotic same dose for now. If any fevers, or new symptoms, be seen right away. I will recheck some blood tests.   Return to the clinic or go to the nearest emergency room if any of your symptoms worsen or new symptoms occur.  Please schedule follow-up with new primary care provider here in the next 6 months.  Thank you for coming in today.  [facility]  Infectious disease physician in [place], [place]  Address: [address], [place], [place] [address] Phone: [phone]  If you have lab work done today you will be contacted with your lab results within the next 2 weeks.  If you have not heard from [place] then please contact us. The fastest way to get your results is to register for My Chart.   IF you received an x-ray today, you will receive an invoice  from [facility]. Please contact [facility] at [phone] with questions or concerns regarding your invoice.   IF you received labwork today, you will receive an invoice from [facility]. Please contact [facility] at [phone] with questions or concerns regarding your invoice.   Our billing staff will not be able to assist you with questions regarding bills from these companies.  You will be contacted with the lab results as soon as they are available. The fastest way to get your results is to activate your My Chart account. Instructions are located on the last page of this paperwork. If you have not heard from [place] regarding the results in 2 weeks, please contact this office.      Signed,   Merri Ray, MD [facility].  12/30/17 1:37 PM

## 2017-12-29 LAB — COMPREHENSIVE METABOLIC PANEL
ALT: 58 IU/L — ABNORMAL HIGH (ref 0–32)
AST: 41 IU/L — ABNORMAL HIGH (ref 0–40)
Albumin/Globulin Ratio: 1.7 (ref 1.2–2.2)
Albumin: 5 g/dL (ref 3.5–5.5)
Alkaline Phosphatase: 73 IU/L (ref 39–117)
BUN / CREAT RATIO: 21 (ref 9–23)
BUN: 15 mg/dL (ref 6–20)
Bilirubin Total: 0.5 mg/dL (ref 0.0–1.2)
CALCIUM: 10 mg/dL (ref 8.7–10.2)
CO2: 21 mmol/L (ref 20–29)
CREATININE: 0.71 mg/dL (ref 0.57–1.00)
Chloride: 99 mmol/L (ref 96–106)
GFR, EST AFRICAN AMERICAN: 137 mL/min/{1.73_m2} (ref >59)
GFR, EST NON AFRICAN AMERICAN: 119 mL/min/{1.73_m2} (ref >59)
GLUCOSE: 90 mg/dL (ref 65–99)
Globulin, Total: 2.9 g/dL (ref 1.5–4.5)
Potassium: 4.8 mmol/L (ref 3.5–5.2)
Sodium: 140 mmol/L (ref 134–144)
TOTAL PROTEIN: 7.9 g/dL (ref 6.0–8.5)

## 2017-12-29 LAB — CBC
Hematocrit: 41.1 % (ref 34.0–46.6)
Hemoglobin: 13.6 g/dL (ref 11.1–15.9)
MCH: 27.9 pg (ref 26.6–33.0)
MCHC: 33.1 g/dL (ref 31.5–35.7)
MCV: 84 fL (ref 79–97)
Platelets: 338 10*3/uL (ref 150–450)
RBC: 4.87 x10E6/uL (ref 3.77–5.28)
RDW: 13.2 % (ref 12.3–15.4)
WBC: 5.6 10*3/uL (ref 3.4–10.8)

## 2017-12-29 LAB — TSH: TSH: 1.61 u[IU]/mL (ref 0.450–4.500)

## 2017-12-30 ENCOUNTER — Encounter: Payer: Self-pay | Admitting: Family Medicine

## 2018-01-21 ENCOUNTER — Ambulatory Visit (HOSPITAL_COMMUNITY)
Admission: EM | Admit: 2018-01-21 | Discharge: 2018-01-21 | Disposition: A | Discharge status: Home / Self Care | Payer: Medicaid Other | Attending: Family Medicine | Admitting: Family Medicine

## 2018-01-21 ENCOUNTER — Encounter (HOSPITAL_COMMUNITY): Payer: Self-pay

## 2018-01-21 DIAGNOSIS — Z3201 Encounter for pregnancy test, result positive: Secondary | ICD-10-CM

## 2018-01-21 DIAGNOSIS — Z1389 Encounter for screening for other disorder: Secondary | ICD-10-CM

## 2018-01-21 LAB — POCT URINALYSIS DIP (DEVICE)
Bilirubin Urine: NEGATIVE
GLUCOSE, UA: NEGATIVE mg/dL
HGB URINE DIPSTICK: NEGATIVE
KETONES UR: NEGATIVE mg/dL
Leukocytes, UA: NEGATIVE
Nitrite: NEGATIVE
Protein, ur: NEGATIVE mg/dL
SPECIFIC GRAVITY, URINE: 1.02 (ref 1.005–1.030)
Urobilinogen, UA: 0.2 mg/dL (ref 0.0–1.0)
pH: 7 (ref 5.0–8.0)

## 2018-01-21 LAB — POCT PREGNANCY, URINE: Preg Test, Ur: POSITIVE — AB

## 2018-01-21 MED ORDER — PRENATAL COMPLETE 14-0.4 MG PO TABS: 1.0000 | ORAL_TABLET | Freq: Every day | ORAL | 0 refills | Status: AC

## 2018-01-21 NOTE — ED Triage Notes (Signed)
Pt took a at home pregnancy two weeks ago, pt just came in to confirm that she is pregnant

## 2018-01-21 NOTE — ED Provider Notes (Signed)
[facility]    CSN: [id number] Arrival date & time: 01/21/18  0941     History   Chief Complaint Chief Complaint  Patient presents with  . Possible Pregnancy    HPI Anne Newton is a 27 y.o. female.   Subjective:  Anne Newton is a 27 y.o. female who presents for evaluation of amenorrhea. She believes she could be pregnant. Pregnancy is desired. Sexual Activity: single partner, contraception: none. Current symptoms also include: positive home pregnancy test. Last period was sometime in the beginning of November 2019.  The following portions of the patient's history were reviewed and updated as appropriate: allergies, current medications, past family history, past medical history, past social history, past surgical history and problem list.         Past Medical History:  Diagnosis Date  . Asthma    prn inhaler  . Hypothyroidism   . Nasal septal deviation 05/2017  . Nasal turbinate hypertrophy 05/2017    Patient Active Problem List   Diagnosis Date Noted  . Hypokalemia   . Sepsis ([place]) 12/09/2017  . Hepatitis B immune 12/15/2014  . Thyroid cancer ([place]) 12/16/2013  . Hypothyroidism 12/16/2013  . Unspecified asthma(493.90) 07/03/2012  . Enlargement of lymph nodes 07/03/2012    Past Surgical History:  Procedure Laterality Date  . SEPTOPLASTY N/A 06/30/2014   Procedure: SEPTOPLASTY;  Surgeon: Rozetta Nunnery, MD;  Location: [facility];  Service: ENT;  Laterality: N/A;  . SEPTOPLASTY N/A 06/26/2017   Procedure: SEPTOPLASTY;  Surgeon: Rozetta Nunnery, MD;  Location: [facility];  Service: ENT;  Laterality: N/A;  . THYROIDECTOMY Left 08/28/2012   Procedure: LEFT THYROID LOBECTOMY;  Surgeon: Rozetta Nunnery, MD;  Location: [facility];  Service: ENT;  Laterality: Left;  . THYROIDECTOMY Right 10/21/2012   Procedure: COMPLETION THYROIDECTOMY (RIGHT THYROID LOBECTOMY);  Surgeon: Rozetta Nunnery, MD;  Location: [facility];   Service: ENT;  Laterality: Right;  . TURBINATE REDUCTION N/A 06/30/2014   Procedure: Albin Felling REDUCTION;  Surgeon: Rozetta Nunnery, MD;  Location: [facility];  Service: ENT;  Laterality: N/A;  . TURBINATE REDUCTION Bilateral 06/26/2017   Procedure: BILATERAL TURBINATE REDUCTION;  Surgeon: Rozetta Nunnery, MD;  Location: [facility];  Service: ENT;  Laterality: Bilateral;    OB History   No obstetric history on file.      Home Medications    Prior to Admission medications   Medication Sig Start Date End Date Taking? Authorizing Provider  levothyroxine (SYNTHROID, LEVOTHROID) 88 MCG tablet Take 1 tablet (88 mcg total) by mouth daily before breakfast. 12/28/17   Wendie Agreste, MD  Prenatal Vit-Fe Fumarate-FA (PRENATAL COMPLETE) 14-0.4 MG TABS Take 1 tablet by mouth daily. 01/21/18   Enrique Sack, FNP    Family History Family History  Problem Relation Age of Onset  . Diabetes Mellitus II Neg Hx     Social History Social History   Tobacco Use  . Smoking status: Never Smoker  . Smokeless tobacco: Never Used  Substance Use Topics  . Alcohol use: No  . Drug use: No     Allergies   Chicken allergy and Shrimp [shellfish allergy]   Review of Systems Review of Systems  Gastrointestinal: Negative.   Genitourinary: Negative.   Musculoskeletal: Negative.   Neurological: Negative.   All other systems reviewed and are negative.    Physical Exam Triage Vital Signs ED Triage Vitals [01/21/18 1101]  Enc Vitals Group  BP 112/68     Pulse Rate 88     Resp 16     Temp 98.4 F (36.9 C)     Temp Source Oral     SpO2 100 %     Weight      Height      Head Circumference      Peak Flow      Pain Score 0     Pain Loc      Pain Edu?      Excl. in [place]?    No data found.  Updated Vital Signs BP 112/68 (BP Location: Right Arm)   Pulse 88   Temp 98.4 F (36.9 C) (Oral)   Resp 16   LMP  (LMP Unknown)   SpO2 100%   Visual  Acuity Right Eye Distance:   Left Eye Distance:   Bilateral Distance:    Right Eye Near:   Left Eye Near:    Bilateral Near:     Physical Exam Constitutional:      Appearance: Normal appearance.  HENT:     Head: Normocephalic.  Neck:     Musculoskeletal: Normal range of motion and neck supple.  Cardiovascular:     Rate and Rhythm: Normal rate.  Pulmonary:     Effort: Pulmonary effort is normal.  Abdominal:     Palpations: Abdomen is soft.  Musculoskeletal: Normal range of motion.  Skin:    General: Skin is warm and dry.  Neurological:     General: No focal deficit present.     Mental Status: She is alert and oriented to person, place, and time.  Psychiatric:        Mood and Affect: Mood normal.        Behavior: Behavior normal.      UC Treatments / Results  Labs (all labs ordered are listed, but only abnormal results are displayed) Labs Reviewed  POCT PREGNANCY, URINE - Abnormal; Notable for the following components:      Result Value   Preg Test, Ur POSITIVE (*)    All other components within normal limits  POC URINE PREG, ED  POCT URINALYSIS DIP (DEVICE)    EKG None  Radiology No results found.  Procedures Procedures (including critical care time)  Medications Ordered in UC Medications - No data to display  Initial Impression / Assessment and Plan / UC Course  I have reviewed the triage vital signs and the nursing notes.  Pertinent labs & imaging results that were available during my care of the patient were reviewed by me and considered in my medical decision making (see chart for details).     28 yo female presenting due to amenorrhea and positive home pregnancy test. She denies any other symptoms at this time. UA negative. Urine HCG test positive. Briefly discussed pre-natal care options. Encouraged well-balanced diet, plenty of rest when needed, pre-natal vitamins daily and walking for exercise. Discussed self-help for nausea, avoiding OTC  medications until consulting provider or pharmacist, other than Tylenol as needed, minimal caffeine and avoiding alcohol.   Today's evaluation has revealed no signs of a dangerous process. Discussed diagnosis with patient. Patient aware of their diagnosis, possible red flag symptoms to watch out for and need for close follow up. Patient understands verbal and written discharge instructions. Patient comfortable with plan and disposition.  Patient has a clear mental status at this time, good insight into illness (after discussion and teaching) and has clear judgment to make decisions regarding their  care.  Documentation was completed with the aid of voice recognition software. Transcription may contain typographical errors.  Final Clinical Impressions(s) / UC Diagnoses   Final diagnoses:  Positive pregnancy test     Discharge Instructions     Take prenatal vitamins daily. Drink plenty of fluids. Only take tylenol if you have any pain. Follow-up with OB/GYN to establish prenatal care.     ED Prescriptions    Medication Sig Dispense Auth. Provider   Prenatal Vit-Fe Fumarate-FA (PRENATAL COMPLETE) 14-0.4 MG TABS Take 1 tablet by mouth daily. 43 each Enrique Sack, [place]     Controlled Substance Prescriptions [place] Controlled Substance Registry consulted? Not Applicable   Enrique Sack, [place] 01/21/18 [phone]

## 2018-01-21 NOTE — Discharge Instructions (Addendum)
Eat a well-balanced diet, get plenty of rest when needed, take prenatal vitamins daily and walk for exercise. Avoid over-the-counter medications  other than Tylenol as needed. Consume minimal caffeine. Avoid alcohol. Call as soon as possible to schedule your initial OB visit

## 2018-04-14 ENCOUNTER — Other Ambulatory Visit: Payer: Self-pay | Admitting: Family Medicine

## 2018-04-14 DIAGNOSIS — E890 Postprocedural hypothyroidism: Secondary | ICD-10-CM

## 2018-06-18 ENCOUNTER — Encounter: Payer: Medicaid Other | Admitting: Registered Nurse

## 2018-07-02 ENCOUNTER — Ambulatory Visit: Payer: Medicaid Other | Admitting: Family Medicine

## 2018-09-01 MED ORDER — BUTORPHANOL TARTRATE 1 MG/ML IJ SOLN
1.00 | INTRAMUSCULAR | Status: DC
Start: ? — End: 2018-09-01

## 2018-09-01 MED ORDER — OXYTOCIN-SODIUM CHLORIDE 30-0.9 UNIT/L-% IV SOLN
.50 | INTRAVENOUS | Status: DC
Start: ? — End: 2018-09-01

## 2018-09-01 MED ORDER — ALUMINUM-MAGNESIUM-SIMETHICONE 200-200-20 MG/5ML PO SUSP
30.00 | ORAL | Status: DC
Start: ? — End: 2018-09-01

## 2018-09-01 MED ORDER — EPHEDRINE SULFATE 50 MG/ML IV SOLN
5.00 | INTRAVENOUS | Status: DC
Start: ? — End: 2018-09-01

## 2018-09-01 MED ORDER — GENERIC EXTERNAL MEDICATION
Status: DC
Start: ? — End: 2018-09-01

## 2018-09-01 MED ORDER — LACTATED RINGERS IV SOLN
INTRAVENOUS | Status: DC
Start: ? — End: 2018-09-01

## 2018-09-01 MED ORDER — GENERIC EXTERNAL MEDICATION
10.00 | Status: DC
Start: ? — End: 2018-09-01

## 2018-09-01 MED ORDER — IBUPROFEN 400 MG PO TABS
800.00 | ORAL_TABLET | ORAL | Status: DC
Start: 2018-09-02 — End: 2018-09-01

## 2018-09-01 MED ORDER — GENERIC EXTERNAL MEDICATION
10.00 | Status: DC
Start: 2018-09-01 — End: 2018-09-01

## 2018-09-01 MED ORDER — HYDROCODONE-ACETAMINOPHEN 5-325 MG PO TABS
2.00 | ORAL_TABLET | ORAL | Status: DC
Start: ? — End: 2018-09-01

## 2018-09-01 MED ORDER — MEASLES, MUMPS & RUBELLA VAC IJ SOLR
.50 | INTRAMUSCULAR | Status: DC
Start: ? — End: 2018-09-01

## 2018-09-01 MED ORDER — TRAMADOL HCL 50 MG PO TABS
100.00 | ORAL_TABLET | ORAL | Status: DC
Start: ? — End: 2018-09-01

## 2018-09-01 MED ORDER — HYDROCODONE-ACETAMINOPHEN 5-325 MG PO TABS
1.00 | ORAL_TABLET | ORAL | Status: DC
Start: ? — End: 2018-09-01

## 2018-09-01 MED ORDER — MAGNESIUM HYDROXIDE 400 MG/5ML PO SUSP
30.00 | ORAL | Status: DC
Start: ? — End: 2018-09-01

## 2018-09-01 MED ORDER — BISACODYL 10 MG RE SUPP
10.00 | RECTAL | Status: DC
Start: ? — End: 2018-09-01

## 2018-09-01 MED ORDER — ONDANSETRON HCL 4 MG/2ML IJ SOLN
4.00 | INTRAMUSCULAR | Status: DC
Start: ? — End: 2018-09-01

## 2018-09-01 MED ORDER — ENEMA 7-19 GM/118ML RE ENEM
1.00 | ENEMA | RECTAL | Status: DC
Start: ? — End: 2018-09-01

## 2018-09-01 MED ORDER — BENZOCAINE-MENTHOL 20-0.5 % EX AERO
1.00 | INHALATION_SPRAY | CUTANEOUS | Status: DC
Start: ? — End: 2018-09-01

## 2018-09-01 MED ORDER — TETANUS-DIPHTH-ACELL PERTUSSIS 5-2.5-18.5 LF-MCG/0.5 IM SUSP
.50 | INTRAMUSCULAR | Status: DC
Start: ? — End: 2018-09-01

## 2018-09-01 MED ORDER — ALBUTEROL SULFATE HFA 108 (90 BASE) MCG/ACT IN AERS
2.00 | INHALATION_SPRAY | RESPIRATORY_TRACT | Status: DC
Start: ? — End: 2018-09-01

## 2018-09-01 MED ORDER — NALOXONE HCL 0.4 MG/ML IJ SOLN
.10 | INTRAMUSCULAR | Status: DC
Start: ? — End: 2018-09-01

## 2018-09-01 MED ORDER — LEVOTHYROXINE SODIUM 112 MCG PO TABS
112.00 | ORAL_TABLET | ORAL | Status: DC
Start: 2018-09-02 — End: 2018-09-01

## 2018-09-01 MED ORDER — DIPHENHYDRAMINE HCL 25 MG PO CAPS
25.00 | ORAL_CAPSULE | ORAL | Status: DC
Start: ? — End: 2018-09-01

## 2018-09-01 MED ORDER — FENTANYL-BUPIVACAINE-NACL 0.2-0.125-0.9 MG/100ML-% EP SOLN
EPIDURAL | Status: DC
Start: ? — End: 2018-09-01

## 2018-09-01 MED ORDER — PRENATAL 28-0.8 MG PO TABS
1.00 | ORAL_TABLET | ORAL | Status: DC
Start: 2018-09-02 — End: 2018-09-01

## 2018-09-01 MED ORDER — ACETAMINOPHEN 325 MG PO TABS
650.00 | ORAL_TABLET | ORAL | Status: DC
Start: ? — End: 2018-09-01
